# Patient Record
Sex: Female | Born: 1935
Health system: Southern US, Community
[De-identification: ages and names within clinical notes are randomized; demographics above are authoritative.]

## PROBLEM LIST (undated history)

## (undated) ENCOUNTER — Emergency Department (HOSPITAL_COMMUNITY): Discharge status: Against Medical Advice | Payer: Medicare Other

## (undated) DIAGNOSIS — M1990 Unspecified osteoarthritis, unspecified site: Secondary | ICD-10-CM

## (undated) DIAGNOSIS — E785 Hyperlipidemia, unspecified: Secondary | ICD-10-CM

## (undated) DIAGNOSIS — H3530 Unspecified macular degeneration: Secondary | ICD-10-CM

## (undated) DIAGNOSIS — K219 Gastro-esophageal reflux disease without esophagitis: Secondary | ICD-10-CM

## (undated) DIAGNOSIS — Z8489 Family history of other specified conditions: Secondary | ICD-10-CM

## (undated) DIAGNOSIS — N189 Chronic kidney disease, unspecified: Secondary | ICD-10-CM

## (undated) DIAGNOSIS — R5383 Other fatigue: Secondary | ICD-10-CM

## (undated) DIAGNOSIS — R0600 Dyspnea, unspecified: Secondary | ICD-10-CM

## (undated) DIAGNOSIS — R079 Chest pain, unspecified: Secondary | ICD-10-CM

## (undated) DIAGNOSIS — E119 Type 2 diabetes mellitus without complications: Secondary | ICD-10-CM

## (undated) DIAGNOSIS — J45909 Unspecified asthma, uncomplicated: Secondary | ICD-10-CM

## (undated) DIAGNOSIS — H548 Legal blindness, as defined in USA: Secondary | ICD-10-CM

## (undated) DIAGNOSIS — C801 Malignant (primary) neoplasm, unspecified: Secondary | ICD-10-CM

## (undated) DIAGNOSIS — I10 Essential (primary) hypertension: Secondary | ICD-10-CM

## (undated) DIAGNOSIS — M419 Scoliosis, unspecified: Secondary | ICD-10-CM

## (undated) DIAGNOSIS — J449 Chronic obstructive pulmonary disease, unspecified: Secondary | ICD-10-CM

## (undated) DIAGNOSIS — K118 Other diseases of salivary glands: Secondary | ICD-10-CM

## (undated) DIAGNOSIS — B029 Zoster without complications: Secondary | ICD-10-CM

## (undated) HISTORY — DX: Hyperlipidemia, unspecified: E78.5

## (undated) HISTORY — DX: Chronic kidney disease, unspecified: N18.9

## (undated) HISTORY — DX: Other fatigue: R53.83

## (undated) HISTORY — DX: Type 2 diabetes mellitus without complications: E11.9

## (undated) HISTORY — DX: Legal blindness, as defined in USA: H54.8

## (undated) HISTORY — DX: Other diseases of salivary glands: K11.8

## (undated) HISTORY — DX: Scoliosis, unspecified: M41.9

## (undated) HISTORY — DX: Unspecified macular degeneration: H35.30

## (undated) HISTORY — DX: Chest pain, unspecified: R07.9

---
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
## 1990-05-19 HISTORY — PX: ORIF ANKLE FRACTURE: SUR919

## 1994-05-19 HISTORY — PX: OTHER SURGICAL HISTORY: SHX169

## 1997-05-19 HISTORY — PX: CARDIAC CATHETERIZATION: SHX172

## 1998-08-06 ENCOUNTER — Inpatient Hospital Stay (HOSPITAL_COMMUNITY): Admission: AD | Admit: 1998-08-06 | Discharge: 1998-08-14 | Payer: Self-pay | Admitting: Cardiology

## 1998-08-09 ENCOUNTER — Encounter: Payer: Self-pay | Admitting: Cardiology

## 1998-08-10 ENCOUNTER — Encounter: Payer: Self-pay | Admitting: Cardiology

## 1998-08-13 ENCOUNTER — Encounter: Payer: Self-pay | Admitting: Gastroenterology

## 2000-03-23 ENCOUNTER — Other Ambulatory Visit: Admission: RE | Admit: 2000-03-23 | Discharge: 2000-03-23 | Payer: Self-pay | Admitting: *Deleted

## 2000-04-24 ENCOUNTER — Other Ambulatory Visit: Admission: RE | Admit: 2000-04-24 | Discharge: 2000-04-24 | Payer: Self-pay | Admitting: *Deleted

## 2000-04-24 ENCOUNTER — Encounter (INDEPENDENT_AMBULATORY_CARE_PROVIDER_SITE_OTHER): Payer: Self-pay | Admitting: Specialist

## 2001-03-31 ENCOUNTER — Ambulatory Visit (HOSPITAL_COMMUNITY): Admission: RE | Admit: 2001-03-31 | Discharge: 2001-03-31 | Payer: Self-pay | Admitting: Cardiology

## 2001-04-06 ENCOUNTER — Ambulatory Visit (HOSPITAL_COMMUNITY): Admission: RE | Admit: 2001-04-06 | Discharge: 2001-04-06 | Payer: Self-pay | Admitting: Cardiology

## 2001-05-19 DIAGNOSIS — K118 Other diseases of salivary glands: Secondary | ICD-10-CM

## 2001-05-19 HISTORY — DX: Other diseases of salivary glands: K11.8

## 2001-05-19 HISTORY — PX: CHOLECYSTECTOMY: SHX55

## 2001-09-08 ENCOUNTER — Other Ambulatory Visit: Admission: RE | Admit: 2001-09-08 | Discharge: 2001-09-08 | Payer: Self-pay | Admitting: Otolaryngology

## 2001-09-22 ENCOUNTER — Encounter: Payer: Self-pay | Admitting: Otolaryngology

## 2001-09-22 ENCOUNTER — Ambulatory Visit (HOSPITAL_COMMUNITY): Admission: RE | Admit: 2001-09-22 | Discharge: 2001-09-22 | Payer: Self-pay | Admitting: Otolaryngology

## 2004-04-01 ENCOUNTER — Ambulatory Visit (HOSPITAL_COMMUNITY): Admission: RE | Admit: 2004-04-01 | Discharge: 2004-04-01 | Payer: Self-pay | Admitting: Family Medicine

## 2004-09-17 ENCOUNTER — Other Ambulatory Visit: Admission: RE | Admit: 2004-09-17 | Discharge: 2004-09-17 | Payer: Self-pay | Admitting: *Deleted

## 2004-10-09 ENCOUNTER — Encounter: Admission: RE | Admit: 2004-10-09 | Discharge: 2004-10-09 | Payer: Self-pay | Admitting: *Deleted

## 2005-09-04 ENCOUNTER — Ambulatory Visit (HOSPITAL_COMMUNITY): Admission: RE | Admit: 2005-09-04 | Discharge: 2005-09-04 | Payer: Self-pay | Admitting: Family Medicine

## 2006-04-16 ENCOUNTER — Ambulatory Visit: Payer: Self-pay | Admitting: Gastroenterology

## 2006-04-16 ENCOUNTER — Ambulatory Visit (HOSPITAL_COMMUNITY): Admission: RE | Admit: 2006-04-16 | Discharge: 2006-04-16 | Payer: Self-pay | Admitting: Gastroenterology

## 2006-04-16 HISTORY — PX: COLONOSCOPY: SHX174

## 2006-09-03 ENCOUNTER — Other Ambulatory Visit: Admission: RE | Admit: 2006-09-03 | Discharge: 2006-09-03 | Payer: Self-pay | Admitting: Otolaryngology

## 2007-03-11 ENCOUNTER — Ambulatory Visit (HOSPITAL_COMMUNITY): Admission: RE | Admit: 2007-03-11 | Discharge: 2007-03-11 | Payer: Self-pay | Admitting: Family Medicine

## 2008-02-04 ENCOUNTER — Ambulatory Visit (HOSPITAL_COMMUNITY): Admission: RE | Admit: 2008-02-04 | Discharge: 2008-02-04 | Payer: Self-pay | Admitting: Family Medicine

## 2008-02-07 ENCOUNTER — Ambulatory Visit: Payer: Self-pay | Admitting: Cardiology

## 2008-02-10 ENCOUNTER — Ambulatory Visit: Payer: Self-pay | Admitting: Cardiology

## 2008-02-10 ENCOUNTER — Ambulatory Visit (HOSPITAL_COMMUNITY): Admission: RE | Admit: 2008-02-10 | Discharge: 2008-02-10 | Payer: Self-pay | Admitting: Cardiology

## 2008-02-10 ENCOUNTER — Encounter: Payer: Self-pay | Admitting: Cardiology

## 2008-03-07 ENCOUNTER — Ambulatory Visit: Payer: Self-pay | Admitting: Cardiology

## 2008-08-30 ENCOUNTER — Ambulatory Visit (HOSPITAL_COMMUNITY): Admission: RE | Admit: 2008-08-30 | Discharge: 2008-08-30 | Payer: Self-pay | Admitting: Family Medicine

## 2008-08-30 LAB — CONVERTED CEMR LAB
BUN: 24 mg/dL
CO2: 25 meq/L
Chloride: 105 meq/L
Creatinine, Ser: 1.35 mg/dL
Sodium: 141 meq/L

## 2008-09-04 LAB — CONVERTED CEMR LAB
ALT: 10 units/L
AST: 14 units/L
LDL (calc): 100 mg/dL

## 2008-12-28 DIAGNOSIS — R0602 Shortness of breath: Secondary | ICD-10-CM

## 2009-01-01 ENCOUNTER — Ambulatory Visit: Payer: Self-pay | Admitting: Cardiology

## 2009-01-01 DIAGNOSIS — R5381 Other malaise: Secondary | ICD-10-CM

## 2009-01-01 DIAGNOSIS — R5383 Other fatigue: Secondary | ICD-10-CM

## 2009-01-01 LAB — CONVERTED CEMR LAB
Basophils Absolute: 0 10*3/uL (ref 0.0–0.1)
Hemoglobin: 13.4 g/dL (ref 12.0–15.0)
Lymphocytes Relative: 30 % (ref 12–46)
Lymphs Abs: 2 10*3/uL (ref 0.7–4.0)
MCHC: 33.3 g/dL (ref 30.0–36.0)
MCV: 91.4 fL (ref 78.0–100.0)
WBC: 6.7 10*3/uL (ref 4.0–10.5)

## 2009-01-02 ENCOUNTER — Encounter: Payer: Self-pay | Admitting: Cardiology

## 2009-01-08 ENCOUNTER — Encounter (INDEPENDENT_AMBULATORY_CARE_PROVIDER_SITE_OTHER): Payer: Self-pay | Admitting: *Deleted

## 2009-01-08 LAB — CONVERTED CEMR LAB: OCCULT 2: NEGATIVE

## 2009-01-18 ENCOUNTER — Ambulatory Visit: Payer: Self-pay | Admitting: Cardiology

## 2009-01-18 ENCOUNTER — Encounter (HOSPITAL_COMMUNITY): Admission: RE | Admit: 2009-01-18 | Discharge: 2009-02-14 | Payer: Self-pay | Admitting: Cardiology

## 2009-01-25 ENCOUNTER — Encounter (INDEPENDENT_AMBULATORY_CARE_PROVIDER_SITE_OTHER): Payer: Self-pay | Admitting: *Deleted

## 2009-01-30 ENCOUNTER — Ambulatory Visit: Payer: Self-pay | Admitting: Cardiology

## 2010-06-10 ENCOUNTER — Encounter: Payer: Self-pay | Admitting: Family Medicine

## 2010-09-24 ENCOUNTER — Other Ambulatory Visit: Payer: Self-pay | Admitting: Family Medicine

## 2010-09-24 DIAGNOSIS — M8580 Other specified disorders of bone density and structure, unspecified site: Secondary | ICD-10-CM

## 2010-09-27 ENCOUNTER — Encounter (HOSPITAL_COMMUNITY): Payer: Self-pay

## 2010-09-27 ENCOUNTER — Ambulatory Visit (HOSPITAL_COMMUNITY)
Admission: RE | Admit: 2010-09-27 | Discharge: 2010-09-27 | Disposition: A | Discharge status: Home / Self Care | Payer: Medicare Other | Source: Ambulatory Visit | Attending: Family Medicine | Admitting: Family Medicine

## 2010-09-27 DIAGNOSIS — M8580 Other specified disorders of bone density and structure, unspecified site: Secondary | ICD-10-CM

## 2010-09-27 DIAGNOSIS — M899 Disorder of bone, unspecified: Secondary | ICD-10-CM | POA: Insufficient documentation

## 2010-09-27 DIAGNOSIS — M949 Disorder of cartilage, unspecified: Secondary | ICD-10-CM | POA: Insufficient documentation

## 2010-09-27 HISTORY — DX: Essential (primary) hypertension: I10

## 2010-10-01 NOTE — Letter (Signed)
March 07, 2008    Scott A. Gerda Diss, MD  [address], [address]  [place], [place] [address]   RE:  Victoria, Vasquez  MRN:  [id number]  /  DOB:  1928-02-06   Dear Lorin Picket:   [name] returns the office for followup of hypertension,  disequilibrium, and EKG abnormalities.  Since her last visit, she has  improved.  She is walking better and feeling better.  She reports no  chest discomfort and no dyspnea.  She monitors blood pressure at home,  which has been good.   On exam, pleasant woman with a flat affect in no acute distress.  VITAL SIGNS:  The weight is 176, two pounds less than at her previous  visit.  Blood pressure 130/80, heart rate 60 and regular, respirations  14.  NECK:  No jugular venous distention; no carotid bruits.  LUNGS:  Clear.  CARDIAC:  Normal first and second heart sounds.  No murmur nor gallop  appreciated.  ABDOMEN:  Soft and nontender.  EXTREMITIES:  Trace edema.   MRI of the brain showed no significant intracranial abnormalities.  She  has a right parotid mass that was noted on a prior study in 2003 and is  unchanged.  An echocardiogram shows mild LVH, moderate left atrial  enlargement and mild-to-moderate mitral regurgitation.  Left ventricular  systolic function is normal.  A chemistry profile after starting  chlorthalidone reveals normal values except for a glucose of 318 and a  slightly elevated creatinine of 1.4.  Potassium was 4.0.   IMPRESSION:  [name] appears to be doing well overall.  Blood pressure  is better controlled.  She will continue to monitor this at home and  call for significantly elevated values.  Her EKG  abnormalities are likely related to LVH and are chronic.  No further  intervention is warranted.  We were unable to locate any useful records  from [facility].  There was no indication of a prior  catheterization.  I will plan to reassess this nice woman in 6 months or  sooner should she develop problems in the intervening  time.    Sincerely,      Gerrit Friends. Dietrich Pates, MD, [facility]  Electronically Signed    RMR/MedQ  DD: 03/07/2008  DT: 03/08/2008  Job #: [id number]

## 2010-10-01 NOTE — Letter (Signed)
February 07, 2008    Scott A. Gerda Diss, MD  [address]  [place], [place] [address]   RE:  Victoria Vasquez, Victoria Vasquez  MRN:  [id number]  /  DOB:  [birth year]   Dear Lorin Picket:   Ms. Baldassari was evaluated in the office today in consultation at your kind  request.  As you know, this nice woman was recently found to have a  significantly abnormal EKG.  She has been evaluated by cardiology in the  past; unfortunately, she is somewhat vague regarding the dates.  She  believes that she underwent cardiac catheterization in conjunction with  an admission for gallbladder disease approximately 6 years ago.  We have  requested records for all of her care at [facility]  She apparently has  not had significant coronary disease in the past.  She does have  cardiovascular risk factors, most notably hypertension, diabetes and  hyperlipidemia, all of them fairly mild.  She has chronic class II-III  exertional dyspnea.  She has localized chest wall tenderness over the  third rim just lateral to the sternum.   PAST MEDICAL HISTORY:  Otherwise notable for that and ectomy performed  at age 77.   ALLERGIES:  She has no known drug allergies.   CURRENT MEDICATIONS:  1. Lisinopril 20 mg daily.  2. Simvastatin 20 mg daily.  3. Aspirin 325 mg daily.  4. Prilosec 20 mg daily.  5. Glyburide 1.5 mg b.i.d.  6. Symbicort 1 capsule b.i.d.   SOCIAL HISTORY:  Retired from farming; widowed with 3 adult children.   FAMILY HISTORY:  Father died at advanced age due to myeloma; mother died  at age 64, also due to neoplastic disease.  Two sisters who deceased,  one with a brain neoplasm and one with also members.  One brother is  alive and well.   REVIEW OF SYSTEMS:  Notable for intermittent dizziness, the need for  corrective lenses, partial upper and lower dentures, GERD symptoms,  arthritic discomfort in her knees and some ankle edema.  All other  systems reviewed and are negative.   Ms. Better also complains of  unsteadiness of gait.  She does not have the  true vertigo nor is she clearly presyncopal.  She nearly complains of  difficulty maintaining her balance while she is moving about.   PHYSICAL EXAMINATION:  GENERAL:  Pleasant woman in no acute distress.  VITAL SIGNS:  Weight is 178, blood pressure 130/85, heart rate 65 and  regular.  There was a modest decrease in blood pressure of 49 mmHg when  the patient assumed the standing position.  HEENT:  Anicteric sclerae; normal lids and conjunctivae; normal oral  mucosa.  NECK:  No jugular venous distention; normal carotid upstrokes without  bruits.  LUNGS:  Mild kyphosis; clear lung fields.  CARDIAC:  Normal first and second heart sound; minimal systolic ejection  murmur.  ABDOMEN:  Soft and nontender; no masses; no organomegaly.  EXTREMITIES:  Trace edema; normal distal pulses.  NEUROLOGIC:  Symmetric strength and tone; normal cranial nerves.  SKIN:  No significant lesions.  ENDOCRINE:  No thyromegaly.  HEMATOCRIT:  No adenopathy.   EKG:  Normal sinus rhythm; slightly delayed R-wave progression; ST-T  wave abnormalities consistent with inferolateral and anterolateral  ischemia.  When compared to a previous tracing performed on August 07, 1998, T-wave abnormalities are slightly more impressive on the present  tracing, but quite similar.   No recent laboratory studies available.  A chest x-ray shows  cardiomegaly with bibasilar fibrotic changes or atelectasis.  A small  hiatal hernia is noted.   IMPRESSION:  Ms. Sedivy presents with some worrisome symptoms and EKG  abnormalities, but all of these are likely chronic.  Prior records from  [facility] are pending.  Control of  hypertension is suboptimal.  We will add chlorthalidone 12.5 mg daily.  An echocardiogram will be obtained.  Due to her neurologic symptoms, an  MRI study of her head has been requested as well.  I will reassess this  nice woman once prior records are obtained and  initial testing has been  completed.    Sincerely,      Gerrit Friends. Dietrich Pates, MD, [facility]  Electronically Signed    RMR/MedQ  DD: 02/07/2008  DT: 02/08/2008  Job #: [id number]

## 2010-10-04 NOTE — Procedures (Signed)
[facility]  Patient:    KEALA, DRUM Visit Number: [id number] MRN: [id number]          Service Type: OUT Location: RAD Attending Physician:  Kathlen Brunswick Dictated by:   Kari Baars, M.D. Admit Date:  03/31/2001 Discharge Date: 03/31/2001                      Pulmonary Function Test Inter.  1. Spirometry shows mild to moderate ventilatory defect with air flow    obstruction at the level of the small airway. 2. Lung volumes are slightly reduced with TLC 74% of predicted. 3. DLCO mildly reduced. 4. Arterial blood gas is normal. Dictated by:   Kari Baars, M.D. Attending Physician:  Kathlen Brunswick DD:  04/07/01 TD:  04/07/01 Job: [id number]

## 2010-10-04 NOTE — Op Note (Signed)
NAME:  Victoria Vasquez, BREAU                ACCOUNT NO.:  [id number]   MEDICAL RECORD NO.:  [id number]          PATIENT TYPE:  AMB   LOCATION:  DAY                           FACILITY:  [facility]   PHYSICIAN:  Kassie Mends, M.D.      DATE OF BIRTH:  1927/10/25   DATE OF PROCEDURE:  04/16/2006  DATE OF DISCHARGE:                               OPERATIVE REPORT   PROCEDURE:  Colonoscopy.   INDICATION FOR EXAM:  Ms. Kyllo is a 77 year old female who presents for  average risk colon cancer screening.   FINDINGS:  1. Normal colon.  Exam limited due to retained particulate matter in      the lumen of the colon, polyps less than 1 cm would have been      easily missed.  Otherwise, no polyps, masses, inflammatory changes      or vascular ectasia seen.  2. Normal retroflexed view of the rectum.   RECOMMENDATIONS:  1. Screening colonoscopy in five years with a two day bowel prep.  2. Follow up with Dr. Simone Curia.   MEDICATIONS:  1. Demerol 75 mg IV.  2. Versed 3 mg IV.   PROCEDURE TECHNIQUE:  Physical exam was performed.  Informed consent was  obtained from the patient after explaining the benefits, risks and  alternatives to the procedure.  The patient was connected to the monitor  and placed in the left lateral position.  Continuous oxygen was provided  by nasal cannula and IV medicine administered through an indwelling  cannula.  After administration of sedation and rectal exam, the  patient's rectum was intubated  and the scope was advanced under direct visualization to the cecum.  The  scope was subsequently removed slowly by carefully examining the color,  texture, anatomy, and integrity of the mucosa on the way out.  The  patient was recovered in the endoscopy suite and discharged home in  satisfactory condition.      Kassie Mends, M.D.  Electronically Signed     SM/MEDQ  D:  04/16/2006  T:  04/16/2006  Job:  [id number]   cc:   Donna Bernard, M.D.  Fax: [phone]

## 2010-10-16 ENCOUNTER — Other Ambulatory Visit: Payer: Self-pay | Admitting: Dermatology

## 2011-01-10 ENCOUNTER — Ambulatory Visit (HOSPITAL_COMMUNITY)
Admission: RE | Admit: 2011-01-10 | Discharge: 2011-01-10 | Disposition: A | Discharge status: Home / Self Care | Payer: Medicare Other | Source: Ambulatory Visit | Attending: Family Medicine | Admitting: Family Medicine

## 2011-01-10 ENCOUNTER — Other Ambulatory Visit: Payer: Self-pay | Admitting: Family Medicine

## 2011-01-10 DIAGNOSIS — R059 Cough, unspecified: Secondary | ICD-10-CM | POA: Insufficient documentation

## 2011-01-10 DIAGNOSIS — R0602 Shortness of breath: Secondary | ICD-10-CM | POA: Insufficient documentation

## 2011-01-10 DIAGNOSIS — R05 Cough: Secondary | ICD-10-CM

## 2011-02-17 ENCOUNTER — Encounter: Payer: Self-pay | Admitting: Gastroenterology

## 2011-05-06 ENCOUNTER — Encounter: Payer: Self-pay | Admitting: Cardiology

## 2011-10-02 ENCOUNTER — Other Ambulatory Visit: Payer: Self-pay | Admitting: Family Medicine

## 2011-10-02 ENCOUNTER — Ambulatory Visit (HOSPITAL_COMMUNITY)
Admission: RE | Admit: 2011-10-02 | Discharge: 2011-10-02 | Disposition: A | Discharge status: Home / Self Care | Payer: Medicare Other | Source: Ambulatory Visit | Attending: Family Medicine | Admitting: Family Medicine

## 2011-10-02 DIAGNOSIS — M79671 Pain in right foot: Secondary | ICD-10-CM

## 2011-10-02 DIAGNOSIS — M79609 Pain in unspecified limb: Secondary | ICD-10-CM | POA: Insufficient documentation

## 2011-10-06 ENCOUNTER — Other Ambulatory Visit: Payer: Self-pay | Admitting: Family Medicine

## 2011-10-06 DIAGNOSIS — M84374A Stress fracture, right foot, initial encounter for fracture: Secondary | ICD-10-CM

## 2011-10-07 ENCOUNTER — Ambulatory Visit (HOSPITAL_COMMUNITY)
Admission: RE | Admit: 2011-10-07 | Discharge: 2011-10-07 | Disposition: A | Discharge status: Home / Self Care | Payer: Medicare Other | Source: Ambulatory Visit | Attending: Family Medicine | Admitting: Family Medicine

## 2011-10-07 ENCOUNTER — Encounter (HOSPITAL_COMMUNITY): Payer: Self-pay

## 2011-10-07 DIAGNOSIS — M79609 Pain in unspecified limb: Secondary | ICD-10-CM | POA: Insufficient documentation

## 2011-10-07 DIAGNOSIS — M84374A Stress fracture, right foot, initial encounter for fracture: Secondary | ICD-10-CM

## 2011-10-07 DIAGNOSIS — M8430XA Stress fracture, unspecified site, initial encounter for fracture: Secondary | ICD-10-CM | POA: Insufficient documentation

## 2011-10-07 MED ORDER — TECHNETIUM TC 99M MEDRONATE IV KIT
25.0000 | PACK | Freq: Once | INTRAVENOUS | Status: AC | PRN
  Administered 2011-10-07: 25 via INTRAVENOUS

## 2011-10-10 ENCOUNTER — Other Ambulatory Visit (HOSPITAL_COMMUNITY): Payer: Self-pay | Admitting: Orthopedic Surgery

## 2011-10-14 ENCOUNTER — Other Ambulatory Visit (HOSPITAL_COMMUNITY): Payer: Medicare Other

## 2011-10-16 ENCOUNTER — Ambulatory Visit (HOSPITAL_COMMUNITY)
Admission: RE | Admit: 2011-10-16 | Discharge: 2011-10-16 | Disposition: A | Discharge status: Home / Self Care | Payer: Medicare Other | Source: Ambulatory Visit | Attending: Orthopedic Surgery | Admitting: Orthopedic Surgery

## 2011-12-31 ENCOUNTER — Emergency Department (HOSPITAL_COMMUNITY): Payer: Medicare Other

## 2011-12-31 ENCOUNTER — Encounter (HOSPITAL_COMMUNITY): Payer: Self-pay | Admitting: *Deleted

## 2011-12-31 ENCOUNTER — Emergency Department (HOSPITAL_COMMUNITY)
Admission: EM | Admit: 2011-12-31 | Discharge: 2011-12-31 | Disposition: A | Discharge status: Home / Self Care | Payer: Medicare Other | Attending: Emergency Medicine | Admitting: Emergency Medicine

## 2011-12-31 DIAGNOSIS — J449 Chronic obstructive pulmonary disease, unspecified: Secondary | ICD-10-CM | POA: Insufficient documentation

## 2011-12-31 DIAGNOSIS — Z79899 Other long term (current) drug therapy: Secondary | ICD-10-CM | POA: Insufficient documentation

## 2011-12-31 DIAGNOSIS — B029 Zoster without complications: Secondary | ICD-10-CM | POA: Insufficient documentation

## 2011-12-31 DIAGNOSIS — E119 Type 2 diabetes mellitus without complications: Secondary | ICD-10-CM | POA: Insufficient documentation

## 2011-12-31 DIAGNOSIS — J4489 Other specified chronic obstructive pulmonary disease: Secondary | ICD-10-CM | POA: Insufficient documentation

## 2011-12-31 DIAGNOSIS — Z9089 Acquired absence of other organs: Secondary | ICD-10-CM | POA: Insufficient documentation

## 2011-12-31 DIAGNOSIS — Z7982 Long term (current) use of aspirin: Secondary | ICD-10-CM | POA: Insufficient documentation

## 2011-12-31 DIAGNOSIS — I10 Essential (primary) hypertension: Secondary | ICD-10-CM | POA: Insufficient documentation

## 2011-12-31 DIAGNOSIS — E785 Hyperlipidemia, unspecified: Secondary | ICD-10-CM | POA: Insufficient documentation

## 2011-12-31 DIAGNOSIS — G510 Bell's palsy: Secondary | ICD-10-CM | POA: Insufficient documentation

## 2011-12-31 HISTORY — DX: Zoster without complications: B02.9

## 2011-12-31 HISTORY — DX: Chronic obstructive pulmonary disease, unspecified: J44.9

## 2011-12-31 LAB — CBC WITH DIFFERENTIAL/PLATELET
Basophils Absolute: 0 10*3/uL (ref 0.0–0.1)
Basophils Relative: 1 % (ref 0–1)
Eosinophils Relative: 7 % — ABNORMAL HIGH (ref 0–5)
HCT: 38.2 % (ref 36.0–46.0)
MCHC: 33 g/dL (ref 30.0–36.0)
MCV: 91 fL (ref 78.0–100.0)
Monocytes Absolute: 0.7 10*3/uL (ref 0.1–1.0)
Neutro Abs: 4 10*3/uL (ref 1.7–7.7)
Platelets: 237 10*3/uL (ref 150–400)
RDW: 14.9 % (ref 11.5–15.5)
WBC: 7.7 10*3/uL (ref 4.0–10.5)

## 2011-12-31 LAB — URINALYSIS, ROUTINE W REFLEX MICROSCOPIC
Ketones, ur: NEGATIVE mg/dL
Leukocytes, UA: NEGATIVE
Nitrite: NEGATIVE
Protein, ur: NEGATIVE mg/dL
Urobilinogen, UA: 0.2 mg/dL (ref 0.0–1.0)

## 2011-12-31 LAB — BASIC METABOLIC PANEL
Calcium: 9.9 mg/dL (ref 8.4–10.5)
Creatinine, Ser: 1.04 mg/dL (ref 0.50–1.10)
GFR calc Af Amer: 57 mL/min — ABNORMAL LOW (ref >90)
Sodium: 141 mEq/L (ref 135–145)

## 2011-12-31 MED ORDER — LISINOPRIL 10 MG PO TABS
10.0000 mg | ORAL_TABLET | Freq: Once | ORAL | Status: AC
  Administered 2011-12-31: 10 mg via ORAL
  Filled 2011-12-31: qty 1

## 2011-12-31 MED ORDER — VALACYCLOVIR HCL 1 G PO TABS: 1000.0000 mg | ORAL_TABLET | Freq: Three times a day (TID) | ORAL | Status: AC

## 2011-12-31 MED ORDER — CLONIDINE HCL 0.2 MG PO TABS
0.2000 mg | ORAL_TABLET | Freq: Once | ORAL | Status: AC
  Administered 2011-12-31: 0.2 mg via ORAL
  Filled 2011-12-31: qty 1

## 2011-12-31 MED ORDER — PREDNISONE 10 MG PO TABS: 20.0000 mg | ORAL_TABLET | Freq: Two times a day (BID) | ORAL | Status: DC

## 2011-12-31 NOTE — ED Notes (Signed)
Pt drank water with and without straw without coughing, ate cracker without difficulty.  Lung sounds did not change post swallow screen.

## 2011-12-31 NOTE — ED Notes (Signed)
Pt's bp still elevated.  Pt says vomited after taking her bp medication this morning.   EDP notified.  Pt also requesting water to drink.  Notified Dr. Judd Lien and ok for pt to drink water.  EDP says pt does not need swallow screen.  Pt drinking water without coughing or drooling.  Says is difficulty because left side of face is numb.

## 2011-12-31 NOTE — ED Notes (Signed)
Numbness lt side of face with droop. Onset last pm. Has shinges rash on lt side of neck. Vomited x1 this am "I choked on my pills"

## 2011-12-31 NOTE — ED Notes (Signed)
Pt reports was diagnosed with shingles approx a month ago.  Shingles are on left shoulder, neck, face and head.  Reports last night left side of face and jaw felt numb.  Pt says when she brushed her teeth, her spit ran out of r side of mouth.  Pt has r sided facial droop.  Also reports having difficulty swallowing, was unable to swallow her pills this morning.  Reports got choked and spit them up.  Denies headache but says still having shingles pain.  Pt c/o feeling weak all over, not any worse on one side or the other.  Pt alert and oriented.  BP elevated.  Notified Dr. Judd Lien and Dr. Estell Harpin.

## 2011-12-31 NOTE — ED Notes (Signed)
Discharge instructions reviewed with pt, questions answered. Pt verbalized understanding.  

## 2011-12-31 NOTE — Discharge Instructions (Signed)
Bell's Palsy Bell's palsy is a condition in which the muscles on one side of the face cannot move (paralysis). This is because the nerves in the face are paralyzed. It is most often thought to be caused by a virus. The virus causes swelling of the nerve that controls movement on one side of the face. The nerve travels through a tight space surrounded by bone. When the nerve swells, it can be compressed by the bone. This results in damage to the protective covering around the nerve. This damage interferes with how the nerve communicates with the muscles of the face. As a result, it can cause weakness or paralysis of the facial muscles.  Injury (trauma), tumor, and surgery may cause Bell's palsy, but most of the time the cause is unknown. It is a relatively common condition. It starts suddenly (abrupt onset) with the paralysis usually ending within 2 days. Bell's palsy is not dangerous. But because the eye does not close properly, you may need care to keep the eye from getting dry. This can include splinting (to keep the eye shut) or moistening with artificial tears. Bell's palsy very seldom occurs on both sides of the face at the same time. SYMPTOMS   Eyebrow sagging.   Drooping of the eyelid and corner of the mouth.   Inability to close one eye.   Loss of taste on the front of the tongue.   Sensitivity to loud noises.  TREATMENT  The treatment is usually non-surgical. If the patient is seen within the first 24 to 48 hours, a short course of steroids may be prescribed, in an attempt to shorten the length of the condition. Antiviral medicines may also be used with the steroids, but it is unclear if they are helpful.  You will need to protect your eye, if you cannot close it. The cornea (clear covering over your eye) will become dry and can be damaged. Artificial tears can be used to keep your eye moist. Glasses or an eye patch should be worn to protect your eye. PROGNOSIS  Recovery is variable,  ranging from days to months. Although the problem usually goes away completely (about 80% of cases resolve), predicting the outcome is impossible. Most people improve within 3 weeks of when the symptoms began. Improvement may continue for 3 to 6 months. A small number of people have moderate to severe weakness that is permanent.  HOME CARE INSTRUCTIONS   If your caregiver prescribed medication to reduce swelling in the nerve, use as directed. Do not stop taking the medication unless directed by your caregiver.   Use moisturizing eye drops as needed to prevent drying of your eye, as directed by your caregiver.   Protect your eye, as directed by your caregiver.   Use facial massage and exercises, as directed by your caregiver.   Perform your normal activities, and get your normal rest.  SEEK IMMEDIATE MEDICAL CARE IF:   There is pain, redness or irritation in the eye.   You or your child has an oral temperature above 102 F (38.9 C), not controlled by medicine.  MAKE SURE YOU:   Understand these instructions.   Will watch your condition.   Will get help right away if you are not doing well or get worse.  Document Released: 05/05/2005 Document Revised: 04/24/2011 Document Reviewed: 05/14/2009 [facility] Patient Information 2012 [place], [place].Shingles Shingles is caused by the same virus that causes chickenpox (varicella zoster virus or VZV). Shingles often occurs many years or decades after  having chickenpox. That is why it is more common in adults older than 50 years. The virus reactivates and breaks out as an infection in a nerve root. SYMPTOMS   The initial feeling (sensations) may be pain. This pain is usually described as:   Burning.   Stabbing.   Throbbing.   Tingling in the nerve root.   A red rash will follow in a couple days. The rash may occur in any area of the body and is usually on one side (unilateral) of the body in a band or belt-like pattern. The rash usually  starts out as very small blisters (vesicles). They will dry up after 7 to 10 days. This is not usually a significant problem except for the pain it causes.   Long-lasting (chronic) pain is more likely in an elderly person. It can last months to years. This condition is called postherpetic neuralgia.  Shingles can be an extremely severe infection in someone with AIDS, a weakened immune system, or with forms of leukemia. It can also be severe if you are taking transplant medicines or other medicines that weaken the immune system. TREATMENT  Your caregiver will often treat you with:  Antiviral drugs.   Anti-inflammatory drugs.   Pain medicines.  Bed rest is very important in preventing the pain associated with herpes zoster (postherpetic neuralgia). Application of heat in the form of a hot water bottle or electric heating pad or gentle pressure with the hand is recommended to help with the pain or discomfort. PREVENTION  A varicella zoster vaccine is available to help protect against the virus. The Food and Drug Administration approved the varicella zoster vaccine for individuals 59 years of age and older. HOME CARE INSTRUCTIONS   Cool compresses to the area of rash may be helpful.   Only take over-the-counter or prescription medicines for pain, discomfort, or fever as directed by your caregiver.   Avoid contact with:   Babies.   Pregnant women.   Children with eczema.   Elderly people with transplants.   People with chronic illnesses, such as leukemia and AIDS.   If the area involved is on your face, you may receive a referral for follow-up to a specialist. It is very important to keep all follow-up appointments. This will help avoid eye complications, chronic pain, or disability.  SEEK IMMEDIATE MEDICAL CARE IF:   You develop any pain (headache) in the area of the face or eye. This must be followed carefully by your caregiver or ophthalmologist. An infection in part of your eye  (cornea) can be very serious. It could lead to blindness.   You do not have pain relief from prescribed medicines.   Your redness or swelling spreads.   The area involved becomes very swollen and painful.   You have a fever.   You notice any red or painful lines extending away from the affected area toward your heart (lymphangitis).   Your condition is worsening or has changed.  Document Released: 05/05/2005 Document Revised: 04/24/2011 Document Reviewed: 04/09/2009 [facility] Patient Information 2012 [place], [place].

## 2011-12-31 NOTE — ED Provider Notes (Signed)
History  This chart was scribed for Victoria Lyons, MD by Victoria Vasquez. This patient was seen in room APA18/APA18 and the patient's care was started at 1335.   CSN: [id number]  Arrival date & time 12/31/11  1335   First MD Initiated Contact with Patient 12/31/11 1449      Chief Complaint  Patient presents with  . Numbness   The history is provided by the patient. No language interpreter was used.   Victoria Vasquez is a 78 y.o. female who presents to the Emergency Department complaining of left sided facial weakness which began three weeks ago when she also started with a shingles rash on the left side of her neck. She states trouble eating/drinking food because of her facial weakness. She states she is still being treated for her shingles rash which has mildly improved. She denies weakness anywhere else over her body and denies any other injuries/illnesses.   Past Medical History  Diagnosis Date  . Diabetes mellitus   . Hypertension   . Exertional dyspnea   . Hyperlipidemia   . Fatigue   . Shingles   . COPD (chronic obstructive pulmonary disease)     Past Surgical History  Procedure Date  . Cholecystectomy 2003  . Appendectomy     Age 26  . Colonoscopy   . Orif 1996  . Cardiac catheterization 1999    Family History  Problem Relation Age of Onset  . Stomach cancer Mother   . Alzheimer's disease Sister     History  Substance Use Topics  . Smoking status: Passive Smoker  . Smokeless tobacco: Not on file  . Alcohol Use: No    OB History    Grav Para Term Preterm Abortions TAB SAB Ect Mult Living                  Review of Systems  Constitutional: Negative for fever and chills.  Respiratory: Negative for shortness of breath.   Gastrointestinal: Negative for nausea and vomiting.  Skin: Positive for rash (Red spotted rash over the left side of her neck face and jaw.).  Neurological: Negative for weakness.       Left sided facial weakness.   All other systems  reviewed and are negative.    Allergies  Review of patient's allergies indicates no known allergies.  Home Medications   Current Outpatient Rx  Name Route Sig Dispense Refill  . ASPIRIN 325 MG PO TBEC Oral Take 325 mg by mouth daily.    . BUDESONIDE-FORMOTEROL FUMARATE 80-4.5 MCG/ACT IN AERO Inhalation Inhale 2 puffs into the lungs as needed. Shortness of breath    . OMEGA-3 FATTY ACIDS 1000 MG PO CAPS Oral Take 2 capsules by mouth daily.      Marland Kitchen GLIPIZIDE 5 MG PO TABS Oral Take 2.5 mg by mouth 2 (two) times daily.    Marland Kitchen LISINOPRIL 5 MG PO TABS Oral Take 5 mg by mouth daily.    Marland Kitchen METFORMIN HCL 500 MG PO TABS Oral Take 250 mg by mouth 2 (two) times daily.    . ICAPS PO CAPS Oral Take 2 capsules by mouth daily.      Marland Kitchen OMEPRAZOLE 20 MG PO CPDR Oral Take 20 mg by mouth daily.      Marland Kitchen VITAMIN D (ERGOCALCIFEROL) 50000 UNITS PO CAPS Oral Take 50,000 Units by mouth every 7 (seven) days. On Wednesday      Triage Vitals: BP 210/88  Pulse 75  Temp 98.1 F (36.7 C) (  Oral)  Resp 18  Ht 5\' 6"  (1.676 m)  Wt 172 lb (78.019 kg)  BMI 27.76 kg/m2  SpO2 100%  Physical Exam  Nursing note and vitals reviewed. Constitutional: She is oriented to person, place, and time. She appears well-developed and well-nourished. No distress.  HENT:  Head: Normocephalic and atraumatic.  Eyes: EOM are normal.  Neck: Neck supple. No tracheal deviation present.  Cardiovascular: Normal rate.   Pulmonary/Chest: Effort normal. No respiratory distress.  Musculoskeletal: Normal range of motion.  Neurological: She is alert and oriented to person, place, and time.       Left sided facial droop present that includes the forehead. The left eyelid does not close all of the way. Strength is 5/5 both BUE/BLE and is symmetrical.   Skin: Skin is warm and dry.       Vesicular rash on the left side of the neck in a dermatomal pattern.   Psychiatric: She has a normal mood and affect. Her behavior is normal.    ED Course    Procedures (including critical care time) DIAGNOSTIC STUDIES: Oxygen Saturation is 99% on room air, normal by my interpretation.    COORDINATION OF CARE: At 250 PM Discussed treatment plan with patient which includes blood work, UA, head CT, and EKG. Patient agrees.    Labs Reviewed  CBC WITH DIFFERENTIAL  BASIC METABOLIC PANEL  URINALYSIS, ROUTINE W REFLEX MICROSCOPIC   No results found.   No diagnosis found.   Date: 12/31/2011  Rate: 64  Rhythm: normal sinus rhythm  QRS Axis: normal  Intervals: normal  ST/T Wave abnormalities: nonspecific ST changes  Conduction Disutrbances:none  Narrative Interpretation:   Old EKG Reviewed: unchanged    MDM  The patient presents with facial numbness and droop.  She was recently diagnosed with shingles on the neck.  The history, physical exam, and results of the workup are consistent with Bell's Palsy.  The head ct and labs are reassuring, and her blood pressure which was high when she arrived, is now improving.  She will be discharged with a prescription for prednisone, valtrex, and is advised to follow up with her pcp in the next week.     I personally performed the services described in this documentation, which was scribed in my presence. The recorded information has been reviewed and considered.           Victoria Lyons, MD 12/31/11 [phone]

## 2012-01-22 LAB — CBC WITH DIFFERENTIAL/PLATELET
ALT: 13 U/L (ref 7–35)
AST: 14 U/L
Alkaline Phosphatase: 48 U/L
Bilirubin, Direct: 0.1 mg/dL (ref 0.01–0.4)
Bilirubin, Indirect: 0.3

## 2012-02-10 ENCOUNTER — Encounter: Payer: Self-pay | Admitting: Gastroenterology

## 2012-02-11 ENCOUNTER — Encounter: Payer: Self-pay | Admitting: Urgent Care

## 2012-02-11 ENCOUNTER — Ambulatory Visit (INDEPENDENT_AMBULATORY_CARE_PROVIDER_SITE_OTHER): Payer: Medicare Other | Admitting: Urgent Care

## 2012-02-11 VITALS — BP 130/90 | HR 88 | Temp 97.6°F | Ht 64.0 in | Wt 177.6 lb

## 2012-02-11 DIAGNOSIS — K219 Gastro-esophageal reflux disease without esophagitis: Secondary | ICD-10-CM

## 2012-02-11 DIAGNOSIS — D649 Anemia, unspecified: Secondary | ICD-10-CM

## 2012-02-11 NOTE — Patient Instructions (Addendum)
Do not take ALEVE or other antiinflammatories or aspirin like Advil, Ibuprofen, Goodys, etc You may take aspirin daily Return stool cards to [place] ASAP You will need EGD & colonoscopy with Dr Darrick Penna in near future

## 2012-02-11 NOTE — Progress Notes (Signed)
Faxed to PCP

## 2012-02-11 NOTE — Progress Notes (Signed)
Referring Provider: Babs Sciara, MD Primary Care Physician:  Lilyan Punt, MD Primary Gastroenterologist:  Dr. Jonette Eva  Chief Complaint  Patient presents with  . Anemia    HPI:  Victoria Vasquez is a 78 y.o. female here as a referral from Dr. Gerda Diss for normocytic anemia.  She was last seen by Dr. Darrick Penna back in 2007 for screening colonoscopy.  Exam is limited due to retained particulate matter and repeat exam was advised in 5 years, however she never had this done. She was found to have a hemoglobin of 11.8 on recent routine lab work. Ferritin was 16. MCV 92. Iron 101. TIBC 218, TIBC 319, percent saturation 32. She is not seen rectal bleeding or melena. She does have occasional constipation, but is not severe so she is not taking anything for it. She has a history of anemia over 30 years ago when she had a blood transfusion after a miscarriage. Denies any chronic anemia. She has recently been onAleve and prednisone for shingles along with her daily aspirin. She has been having heartburn and indigestion as well as globus. She denies any dysphagia or odynophagia. She is taking Prilosec 20 mg daily. Her weight has been stable and her appetite has been good.  Past Medical History  Diagnosis Date  . Diabetes mellitus   . Hypertension   . Exertional dyspnea   . Hyperlipidemia   . Fatigue   . Shingles   . COPD (chronic obstructive pulmonary disease)     Past Surgical History  Procedure Date  . Cholecystectomy 2003  . Appendectomy     Age 12  . Colonoscopy 04/16/2006    [place]-Normal colon.  Exam limited due to retained particulate matter in the lumen of the colon, polyps less than 1 cm would have been easily missed.  Otherwise, no polyps, masses, inflammatory changes or vascular ectasia seen/ Normal retroflexed view of the rectum..  recommend repeat exam at 03/2011  . Orif 1996  . Cardiac catheterization 1999    Current Outpatient Prescriptions  Medication Sig Dispense Refill  .  aspirin 325 MG EC tablet Take 325 mg by mouth daily.      . budesonide-formoterol (SYMBICORT) 80-4.5 MCG/ACT inhaler Inhale 2 puffs into the lungs as needed. Shortness of breath      . fish oil-omega-3 fatty acids 1000 MG capsule Take 2 capsules by mouth daily.        Marland Kitchen glipiZIDE (GLUCOTROL) 5 MG tablet Take 2.5 mg by mouth 2 (two) times daily.      Marland Kitchen lisinopril (PRINIVIL,ZESTRIL) 5 MG tablet Take 5 mg by mouth daily.      . metFORMIN (GLUCOPHAGE) 500 MG tablet Take 250 mg by mouth 2 (two) times daily.      . Multiple Vitamins-Minerals (ICAPS) CAPS Take 2 capsules by mouth daily.        Marland Kitchen omeprazole (PRILOSEC) 20 MG capsule Take 20 mg by mouth daily.        . Vitamin D, Ergocalciferol, (DRISDOL) 50000 UNITS CAPS Take 50,000 Units by mouth every 7 (seven) days. On Wednesday        Allergies as of 02/11/2012  . (No Known Allergies)    Family History  Problem Relation Age of Onset  . Stomach cancer Mother   . Alzheimer's disease Sister     History   Social History  . Marital Status: Widowed    Spouse Name: N/A    Number of Children: 3  . Years of Education: N/A  Occupational History  . Retired    Social History Main Topics  . Smoking status: Passive Smoke Exposure - Never Smoker  . Smokeless tobacco: Not on file  . Alcohol Use: No  . Drug Use: No  . Sexually Active: Not on file   Other Topics Concern  . Not on file   Social History Narrative   Widowed3 childrenNo regular exercise    Review of Systems: Gen: Denies any fever, chills, sweats, anorexia, fatigue, weakness, malaise, weight loss, and sleep disorder CV: Denies chest pain, angina, palpitations, syncope, orthopnea, PND, peripheral edema, and claudication. Resp: Denies dyspnea at rest, dyspnea with exercise, cough, sputum, wheezing, coughing up blood, and pleurisy. GI: Denies vomiting blood, jaundice, and fecal incontinence. . GU : Denies urinary burning, blood in urine, urinary frequency, urinary hesitancy,  nocturnal urination, and urinary incontinence. MS: Denies joint pain, limitation of movement, and swelling, stiffness, low back pain, extremity pain. Denies muscle weakness, cramps, atrophy.  Derm: Denies rash, itching, dry skin, hives, moles, warts, or unhealing ulcers.  Psych: Denies depression, anxiety, memory loss, suicidal ideation, hallucinations, paranoia, and confusion. Heme: Denies bruising, bleeding, and enlarged lymph nodes. Neuro:  Denies any headaches, dizziness, paresthesias. Endo:  Denies any problems with DM, thyroid, adrenal function.  Physical Exam: BP 130/90  Pulse 88  Temp 97.6 F (36.4 C) (Temporal)  Ht 5\' 4"  (1.626 m)  Wt 177 lb 9.6 oz (80.559 kg)  BMI 30.49 kg/m2 No LMP recorded. Patient is postmenopausal. General:   Alert,  Well-developed, well-nourished, pleasant and cooperative in NAD Head:  Normocephalic and atraumatic. Eyes:  Sclera clear, no icterus.   Conjunctiva pink. Ears:  Normal auditory acuity. Nose:  No deformity, discharge, or lesions. Mouth:  No deformity or lesions,oropharynx pink & moist. Neck:  Supple; no masses or thyromegaly. Lungs:  Clear throughout to auscultation.   No wheezes, crackles, or rhonchi. No acute distress. Heart:  Regular rate and rhythm; no murmurs, clicks, rubs,  or gallops. Abdomen:  Normal bowel sounds.  No bruits.  Soft, non-tender and non-distended without masses, hepatosplenomegaly or hernias noted.  No guarding or rebound tenderness.   Rectal:  No external or internal lesions visualized or palpated. A small amount of medium brown stool was obtained from the vault which was Hemoccult negative. Msk:  Symmetrical without gross deformities. Normal posture. Pulses:  Normal pulses noted. Extremities:  No clubbing or edema. Neurologic:  Alert and oriented x4;  grossly normal neurologically. Skin:  Intact without significant lesions or rashes. Lymph Nodes:  No significant cervical adenopathy. Psych:  Alert and cooperative.  Normal mood and affect.

## 2012-02-11 NOTE — Assessment & Plan Note (Addendum)
Victoria Vasquez is a pleasant 78 y.o. female with mild normocytic anemia. Hemoccult negative today. No gross GI bleeding. Her iron and ferritin are low normal.  In light of the fact that she is overdue for colonoscopy given incomplete exam over 5 years ago, she is going to need a colonoscopy. She'll also need an EGD given her upper GI symptoms. I have discussed risks & benefits which include, but are not limited to, bleeding, infection, perforation & drug reaction.  The patient agrees with this plan & written consent will be obtained.  She is at risk for GI injury given recent use of prednisone, Aleve, and aspirin.  Hemoccult stools x3 to look for occult GI bleeding Do not take ALEVE or other antiinflammatories or aspirin like Advil, Ibuprofen, Goodys, etc Continue aspirin daily Take half of Glucophage and glipizide the day prior to the procedures Hold diabetes medications day of procedure Bring all medications and/or any insulin to the hospital the day of the procedure. Follow blood sugars, call us or your PCP if any problems.

## 2012-02-11 NOTE — Progress Notes (Signed)
Quick Note:  Reviewed at OV ______ 

## 2012-02-11 NOTE — Assessment & Plan Note (Signed)
Refractory GERD and globus despite Prilosec 20 mg daily. Symptoms may be exacerbated by recent prednisone and NSAID along with aspirin. Differentials include NSAID induced gastritis, H. pylori and peptic ulcer disease.  EGD with Dr. Darrick Penna.  Continue Prilosec 20 milligrams daily

## 2012-02-16 ENCOUNTER — Telehealth: Payer: Self-pay | Admitting: Urgent Care

## 2012-02-16 ENCOUNTER — Ambulatory Visit: Payer: Medicare Other | Admitting: Urgent Care

## 2012-02-16 DIAGNOSIS — D649 Anemia, unspecified: Secondary | ICD-10-CM

## 2012-02-16 LAB — POC HEMOCCULT BLD/STL (HOME/3-CARD/SCREEN)
Card #3 Fecal Occult Blood, POC: NEGATIVE
Fecal Occult Blood, POC: NEGATIVE

## 2012-02-16 NOTE — Progress Notes (Signed)
Faxed to PCP

## 2012-02-16 NOTE — Progress Notes (Signed)
Quick Note:  See phone note Cc:LUKING,SCOTT, MD  ______ 

## 2012-02-16 NOTE — Telephone Encounter (Signed)
Hemoccults negative x 3.  Discussed w/ pt.  I have discussed risks & benefits which include, but are not limited to, bleeding, infection, perforation & drug reaction.  The patient agrees with this plan & written consent will be obtained.   Pt needs EGD & colonoscopy w/ Dr Darrick Penna with 2 DAY PREP Re: incomplete colonoscopy 2007 & refractory GERD, anemia Take half of glipizide & glucophage the day prior to the procedure Hold diabetes medications day of procedure Bring all your medications and/or any insulin to the hospital the day of the procedure. Follow blood sugars, call us or your PCP if any problems. *Prefers any day but Wednesday Please arrange Thanks

## 2012-02-17 ENCOUNTER — Other Ambulatory Visit: Payer: Self-pay | Admitting: Gastroenterology

## 2012-02-17 DIAGNOSIS — D649 Anemia, unspecified: Secondary | ICD-10-CM

## 2012-02-17 DIAGNOSIS — K219 Gastro-esophageal reflux disease without esophagitis: Secondary | ICD-10-CM

## 2012-02-17 MED ORDER — PEG-KCL-NACL-NASULF-NA ASC-C 100 G PO SOLR: 1.0000 | ORAL | Status: DC

## 2012-02-17 NOTE — Telephone Encounter (Signed)
Patient is scheduled with [facility] on Friday Oct 11th at 9:30 and Ive mailed her the instructions and she is aware

## 2012-02-23 ENCOUNTER — Encounter (HOSPITAL_COMMUNITY): Payer: Self-pay

## 2012-02-26 MED ORDER — SODIUM CHLORIDE 0.45 % IV SOLN
INTRAVENOUS | Status: DC
  Administered 2012-02-27: 09:00:00 via INTRAVENOUS

## 2012-02-27 ENCOUNTER — Encounter (HOSPITAL_COMMUNITY): Payer: Self-pay | Admitting: *Deleted

## 2012-02-27 ENCOUNTER — Ambulatory Visit (HOSPITAL_COMMUNITY)
Admission: RE | Admit: 2012-02-27 | Discharge: 2012-02-27 | Disposition: A | Discharge status: Home / Self Care | Payer: Medicare Other | Source: Ambulatory Visit | Attending: Gastroenterology | Admitting: Gastroenterology

## 2012-02-27 ENCOUNTER — Encounter (HOSPITAL_COMMUNITY)
Admission: RE | Disposition: A | Discharge status: Home / Self Care | Payer: Self-pay | Source: Ambulatory Visit | Attending: Gastroenterology

## 2012-02-27 DIAGNOSIS — I10 Essential (primary) hypertension: Secondary | ICD-10-CM | POA: Insufficient documentation

## 2012-02-27 DIAGNOSIS — K219 Gastro-esophageal reflux disease without esophagitis: Secondary | ICD-10-CM

## 2012-02-27 DIAGNOSIS — K2940 Chronic atrophic gastritis without bleeding: Secondary | ICD-10-CM | POA: Insufficient documentation

## 2012-02-27 DIAGNOSIS — J449 Chronic obstructive pulmonary disease, unspecified: Secondary | ICD-10-CM | POA: Insufficient documentation

## 2012-02-27 DIAGNOSIS — K5730 Diverticulosis of large intestine without perforation or abscess without bleeding: Secondary | ICD-10-CM | POA: Insufficient documentation

## 2012-02-27 DIAGNOSIS — K2970 Gastritis, unspecified, without bleeding: Secondary | ICD-10-CM

## 2012-02-27 DIAGNOSIS — Z01812 Encounter for preprocedural laboratory examination: Secondary | ICD-10-CM | POA: Insufficient documentation

## 2012-02-27 DIAGNOSIS — E119 Type 2 diabetes mellitus without complications: Secondary | ICD-10-CM | POA: Insufficient documentation

## 2012-02-27 DIAGNOSIS — D649 Anemia, unspecified: Secondary | ICD-10-CM

## 2012-02-27 DIAGNOSIS — K648 Other hemorrhoids: Secondary | ICD-10-CM | POA: Insufficient documentation

## 2012-02-27 DIAGNOSIS — K222 Esophageal obstruction: Secondary | ICD-10-CM | POA: Insufficient documentation

## 2012-02-27 DIAGNOSIS — J4489 Other specified chronic obstructive pulmonary disease: Secondary | ICD-10-CM | POA: Insufficient documentation

## 2012-02-27 DIAGNOSIS — K2990 Gastroduodenitis, unspecified, without bleeding: Secondary | ICD-10-CM

## 2012-02-27 LAB — GLUCOSE, CAPILLARY: Glucose-Capillary: 171 mg/dL — ABNORMAL HIGH (ref 70–99)

## 2012-02-27 SURGERY — COLONOSCOPY WITH ESOPHAGOGASTRODUODENOSCOPY (EGD)
Anesthesia: Moderate Sedation

## 2012-02-27 MED ORDER — MIDAZOLAM HCL 5 MG/5ML IJ SOLN
INTRAMUSCULAR | Status: AC
  Filled 2012-02-27: qty 10

## 2012-02-27 MED ORDER — HYDRALAZINE HCL 20 MG/ML IJ SOLN
INTRAMUSCULAR | Status: AC
  Filled 2012-02-27: qty 1

## 2012-02-27 MED ORDER — BUTAMBEN-TETRACAINE-BENZOCAINE 2-2-14 % EX AERO
INHALATION_SPRAY | CUTANEOUS | Status: DC | PRN
  Administered 2012-02-27: 2 via TOPICAL

## 2012-02-27 MED ORDER — HYDRALAZINE HCL 20 MG/ML IJ SOLN
10.0000 mg | Freq: Once | INTRAMUSCULAR | Status: AC
  Administered 2012-02-27: 10 mg via INTRAVENOUS

## 2012-02-27 MED ORDER — STERILE WATER FOR IRRIGATION IR SOLN
Status: DC | PRN
  Administered 2012-02-27: 10:00:00

## 2012-02-27 MED ORDER — MEPERIDINE HCL 100 MG/ML IJ SOLN
INTRAMUSCULAR | Status: AC
  Filled 2012-02-27: qty 2

## 2012-02-27 MED ORDER — MINERAL OIL PO OIL
TOPICAL_OIL | ORAL | Status: AC
  Filled 2012-02-27: qty 30

## 2012-02-27 MED ORDER — MIDAZOLAM HCL 5 MG/5ML IJ SOLN
INTRAMUSCULAR | Status: DC | PRN
  Administered 2012-02-27 (×3): 1 mg via INTRAVENOUS
  Administered 2012-02-27: 2 mg via INTRAVENOUS
  Administered 2012-02-27: 1 mg via INTRAVENOUS

## 2012-02-27 MED ORDER — MEPERIDINE HCL 100 MG/ML IJ SOLN
INTRAMUSCULAR | Status: DC | PRN
  Administered 2012-02-27 (×3): 25 mg via INTRAVENOUS

## 2012-02-27 NOTE — H&P (Signed)
Primary Care Physician:  Lilyan Punt, MD Primary Gastroenterologist:  Dr. Darrick Penna  Pre-Procedure History & Physical: HPI:  Victoria Vasquez is a 78 y.o. female here for  ANEMIA/HEME NEGx3.   Past Medical History  Diagnosis Date  . Diabetes mellitus   . Hypertension   . Exertional dyspnea   . Hyperlipidemia   . Fatigue   . Shingles   . COPD (chronic obstructive pulmonary disease)     Past Surgical History  Procedure Date  . Cholecystectomy 2003  . Appendectomy     Age 89  . Colonoscopy 04/16/2006    Manus-Normal colon.  Exam limited due to retained particulate matter in the lumen of the colon, polyps less than 1 cm would have been easily missed.  Otherwise, no polyps, masses, inflammatory changes or vascular ectasia seen/ Normal retroflexed view of the rectum..  recommend repeat exam at 03/2011  . Orif 1996  . Cardiac catheterization 1999    Prior to Admission medications   Medication Sig Start Date End Date Taking? Authorizing Provider  peg 3350 powder (MOVIPREP) 100 G SOLR Take 1 kit (100 g total) by mouth as directed. 02/17/12  Yes [place] Bali, MD  aspirin 325 MG EC tablet Take 325 mg by mouth daily.    Historical Provider, MD  atorvastatin (LIPITOR) 10 MG tablet Take 1 tablet by mouth 3 (three) times a week. Take on Monday, Wednesday, and Friday 01/22/12   Historical Provider, MD  budesonide-formoterol (SYMBICORT) 80-4.5 MCG/ACT inhaler Inhale 2 puffs into the lungs daily as needed. Shortness of breath    Historical Provider, MD  gabapentin (NEURONTIN) 100 MG capsule Take 100 mg by mouth daily.    Historical Provider, MD  glipiZIDE (GLUCOTROL) 5 MG tablet Take 2.5 mg by mouth 2 (two) times daily.    Historical Provider, MD  lisinopril (PRINIVIL,ZESTRIL) 5 MG tablet Take 5 mg by mouth daily.    Historical Provider, MD  metFORMIN (GLUCOPHAGE) 500 MG tablet Take 250 mg by mouth 2 (two) times daily.    Historical Provider, MD  omeprazole (PRILOSEC) 20 MG capsule Take 20 mg by  mouth daily.     Historical Provider, MD  Vitamin D, Ergocalciferol, (DRISDOL) 50000 UNITS CAPS Take 50,000 Units by mouth every 7 (seven) days. On Wednesday    Historical Provider, MD    Allergies as of 02/17/2012  . (No Known Allergies)    Family History  Problem Relation Age of Onset  . Stomach cancer Mother   . Alzheimer's disease Sister   . Colon cancer Neg Hx     History   Social History  . Marital Status: Widowed    Spouse Name: N/A    Number of Children: 3  . Years of Education: N/A   Occupational History  . Retired    Social History Main Topics  . Smoking status: Passive Smoke Exposure - Never Smoker  . Smokeless tobacco: Not on file  . Alcohol Use: No  . Drug Use: No  . Sexually Active: Not on file   Other Topics Concern  . Not on file   Social History Narrative   Widowed3 childrenNo regular exercise    Review of Systems: See HPI, otherwise negative ROS   Physical Exam: BP 183/89  Pulse 75  Temp 98 F (36.7 C) (Oral)  Resp 18  Ht 5\' 4"  (1.626 m)  Wt 177 lb (80.287 kg)  BMI 30.38 kg/m2  SpO2 98% General:   Alert,  pleasant and cooperative in NAD Head:  Normocephalic and atraumatic. Neck:  Supple; Lungs:  Clear throughout to auscultation.    Heart:  Regular rate and rhythm. Abdomen:  Soft, nontender and nondistended. Normal bowel sounds, without guarding, and without rebound.   Neurologic:  Alert and  oriented x4;  grossly normal neurologically.  Impression/Plan:     Anemia/heme negX3  PLAN:  1. TCS/?EGD TODAY

## 2012-02-27 NOTE — Op Note (Signed)
[facility] [address] [place], [address]   ENDOSCOPY PROCEDURE REPORT  PATIENT: Victoria Vasquez, Victoria Vasquez  MR#: [id number] BIRTHDATE: July 03, 1927 , 84  yrs. old GENDER: Female  ENDOSCOPIST: Jonette Eva, MD REFFERED XB:JYNWG Gerda Diss, M.D.  PROCEDURE DATE:  02/27/2012 PROCEDURE:   EGD with biopsy and EGD with dilatation over guidewire   INDICATIONS:1.  anemia. MEDICATIONS: Demerol 25 mg IV and Versed 1mg  IV TOPICAL ANESTHETIC: Cetacaine Spray  DESCRIPTION OF PROCEDURE:   After the risks benefits and alternatives of the procedure were thoroughly explained, informed consent was obtained.  The EG-[id number] ([id number])  endoscope was introduced through the mouth and advanced to the second portion of the duodenum.  The instrument was slowly withdrawn as the mucosa was carefully examined.  Prior to withdrawal of the scope, the guidwire was placed.  The esophagus was dilated successfully.  The patient was recovered in endoscopy and discharged home in satisfactory condition.      ESOPHAGUS: A stricture was found at the gastroesophageal junction. The stenosis was traversable with the endoscope.   A medium sized hiatal hernia was noted.  STOMACH: Non-erosive gastritis (inflammation) was found.  Multiple biopsies were performed using cold forceps.  DUODENUM: The duodenal mucosa showed no abnormalities.   Dilation was then performed at the gastroesphageal junction  Dilator: Savary over guidewire Size(s): 12.8-16 mm Resistance: minimal TO MODERATE.  COMPLICATIONS: There were no complications.   ENDOSCOPIC IMPRESSION: 1.   Stricture was found at the gastroesophageal junction 2.   Medium sized hiatal hernia 3.   Non-erosive gastritis (inflammation) was found; multiple biopsies 4.   The duodenal mucosa showed no abnormalities  RECOMMENDATIONS: NO OBVIOUS SOURCE FOR ANEMIA IDENTIFIED.MAY NEED CAPSULE TO COMPLETE EVALUATION.  AWAIT BIOPSY.  CONTINUE OMEPRAZOLE. OPV IN  3 MOS.      _______________________________ Rosalie DoctorJonette Eva, MD 02/27/2012 10:38 AM      PATIENT NAME:  Victoria Vasquez, Victoria Vasquez MR#: [id number]

## 2012-02-27 NOTE — Op Note (Signed)
[facility] [address] [place], [address]   COLONOSCOPY PROCEDURE REPORT  PATIENT: Victoria Vasquez, Victoria Vasquez  MR#: [id number] BIRTHDATE: 05-May-1928 , 84  yrs. old GENDER: Female ENDOSCOPIST: Jonette Eva, MD REFERRED QM:VHQIO Gerda Diss, M.D. PROCEDURE DATE:  02/27/2012 PROCEDURE:   Colonoscopy, surveillance INDICATIONS:anemia, non-specific.  ON NSAIDS. HEME NEGx3 MEDICATIONS: Demerol 50 mg IV and Versed 5 mg IV  DESCRIPTION OF PROCEDURE:    Physical exam was performed.  Informed consent was obtained from the patient after explaining the benefits, risks, and alternatives to procedure.  The patient was connected to monitor and placed in left lateral position. Continuous oxygen was provided by nasal cannula and IV medicine administered through an indwelling cannula.  After administration of sedation and rectal exam, the patients rectum was intubated and the Pentax Colonoscope [id number]  colonoscope was advanced under direct visualization to the cecum.  The scope was removed slowly by carefully examining the color, texture, anatomy, and integrity mucosa on the way out.  The patient was recovered in endoscopy and discharged home in satisfactory condition.       COLON FINDINGS: Moderate diverticulosis was noted in the sigmoid colon.  , The colon mucosa was otherwise normal.  , and Small internal hemorrhoids were found.    REDUNDNAT TRANSVERSE/SIGMOID COLON. UNABLE TOINTUBATE THE ILEUM.  PREP QUALITY: good. CECAL W/D TIME: 15 minutes  COMPLICATIONS: None  ENDOSCOPIC IMPRESSION: 1.   Moderate diverticulosis was noted in the sigmoid colon 2.   The colon mucosa was otherwise normal 3.   Small internal hemorrhoids   RECOMMENDATIONS: HIGH FIBER DIET NEXT TCS WITH AN OVERTUBE       _______________________________ eSignedJonette Eva, MD 02/27/2012 10:19 AM

## 2012-03-01 ENCOUNTER — Telehealth: Payer: Self-pay | Admitting: Gastroenterology

## 2012-03-01 NOTE — Telephone Encounter (Signed)
Path faxed to PCP, recall made 

## 2012-03-01 NOTE — Telephone Encounter (Signed)
Please call pt. HER stomach Bx shows gastritis.   CONTINUE OMEPRAZOLE EVERY MORNING.  FOLLOW A HIGH FIBER DIET. AVOID ITEMS THAT CAUSE BLOATING.   FOLLOW UP IN 3 MOS. WE WILL RECHECK YOUR BLOOD COUNT.

## 2012-03-02 NOTE — Telephone Encounter (Signed)
Called and informed pt.  

## 2012-03-27 NOTE — Progress Notes (Signed)
REPEAT CBC AT NEXT VISIT. IF HB LOW, CONSIDER CAPSULE ENDOSCOPY.

## 2012-03-27 NOTE — Progress Notes (Signed)
REVIEWED.  SEP 2013 HB 11.8 HEMR NEGx3  EGD/DIL OCT 2013 GASTRITIS TCS OCT  2013 TICS/IH

## 2012-05-04 ENCOUNTER — Encounter: Payer: Self-pay | Admitting: *Deleted

## 2012-08-17 ENCOUNTER — Encounter: Payer: Self-pay | Admitting: *Deleted

## 2012-08-18 ENCOUNTER — Ambulatory Visit (INDEPENDENT_AMBULATORY_CARE_PROVIDER_SITE_OTHER): Payer: Medicare Other | Admitting: Family Medicine

## 2012-08-18 ENCOUNTER — Encounter: Payer: Self-pay | Admitting: Family Medicine

## 2012-08-18 VITALS — BP 188/106 | HR 80 | Ht 61.0 in | Wt 173.8 lb

## 2012-08-18 DIAGNOSIS — I10 Essential (primary) hypertension: Secondary | ICD-10-CM

## 2012-08-18 DIAGNOSIS — E119 Type 2 diabetes mellitus without complications: Secondary | ICD-10-CM

## 2012-08-18 LAB — BASIC METABOLIC PANEL
Calcium: 9.9 mg/dL (ref 8.4–10.5)
Glucose, Bld: 135 mg/dL — ABNORMAL HIGH (ref 70–99)
Sodium: 141 mEq/L (ref 135–145)

## 2012-08-18 LAB — HEMOGLOBIN A1C
Hgb A1c MFr Bld: 7.7 % — ABNORMAL HIGH (ref <5.7)
Mean Plasma Glucose: 174 mg/dL — ABNORMAL HIGH (ref <117)

## 2012-08-18 NOTE — Patient Instructions (Signed)
Please do your lab work in the near future.  Start using lisinopril 5 mg, take 2 tablets each morning. Call us next week [phone] to tell the nurses how your blood pressures are doing. Followup with [place] in 2 weeks' time.  You may use some salt but don't go overboard.

## 2012-08-18 NOTE — Progress Notes (Signed)
  Subjective:    Patient ID: Victoria Vasquez, female    DOB: 1927/12/23, 79 y.o.   MRN: [id number]  Hypertension This is a chronic problem. The current episode started more than 1 month ago. The problem has been gradually worsening since onset. The problem is uncontrolled. Associated symptoms include headaches. (Dizziness) There are no associated agents to hypertension. Risk factors for coronary artery disease include diabetes mellitus. Past treatments include angiotensin blockers. The current treatment provides no improvement. There are no compliance problems.    She relates over the past couple months her blood pressures, but concerned her. She also states when she gets up she feels dizzy no unilateral numbness or weakness.   Review of Systems  Neurological: Positive for headaches.   Denies chest pressure shortness of breath swelling in the legs. She relates compliance with her medicines.    Objective:   Physical Exam  Blood pressure elevated it was checked twice it was also checked with her machine. Her machine does correlate with our machine. Best reading 168/102 Lungs are clear no crackles heart regular no murmurs pulses are normal Abdomen is soft extremities no edema foot exam normal      Assessment & Plan:  DIABETES MELLITUS, TYPE II - Plan: Hemoglobin A1c  HYPERTENSION - Plan: Basic metabolic panel  And in her in the meantime she is to use 2 lisinopril tablets each morning until she follows up. She will check her blood pressures call us next week with those readings she will followup in 2 weeks' time.

## 2012-08-25 ENCOUNTER — Telehealth: Payer: Self-pay | Admitting: Family Medicine

## 2012-08-25 NOTE — Telephone Encounter (Signed)
Called to let Dr. Lorin Picket know her Blood Pressure is 181/104 today, yesterday it was 177/102.  Patient states she has double the medication as requested by Dr.  Please call patient with further instructions.

## 2012-08-25 NOTE — Telephone Encounter (Signed)
Add lozol 1.25 qam    #30 4 refills, this is a mild diuresing check it should help with the other medicine to bring her blood pressure in a more reasonable range,bring all meds to follow up visit ( should have one somewhere within the next few weeks)

## 2012-08-26 NOTE — Telephone Encounter (Signed)
Discussed with patient. Med called into [facility] Patient stated she has office visit scheduled in a few weeks for follow up.

## 2012-09-01 ENCOUNTER — Ambulatory Visit (INDEPENDENT_AMBULATORY_CARE_PROVIDER_SITE_OTHER): Payer: Medicare Other | Admitting: Family Medicine

## 2012-09-01 ENCOUNTER — Encounter: Payer: Self-pay | Admitting: Family Medicine

## 2012-09-01 VITALS — BP 148/88 | Wt 173.0 lb

## 2012-09-01 DIAGNOSIS — E119 Type 2 diabetes mellitus without complications: Secondary | ICD-10-CM

## 2012-09-01 DIAGNOSIS — R0602 Shortness of breath: Secondary | ICD-10-CM

## 2012-09-01 DIAGNOSIS — I10 Essential (primary) hypertension: Secondary | ICD-10-CM

## 2012-09-01 DIAGNOSIS — R079 Chest pain, unspecified: Secondary | ICD-10-CM

## 2012-09-01 MED ORDER — GLIPIZIDE 5 MG PO TABS: ORAL_TABLET | ORAL | Status: DC

## 2012-09-01 NOTE — Patient Instructions (Signed)
Need to see evening glucose look better. I would recommend glipizide take a full tablet in the morning(5mg ) and a half of a tablet at suppertime.  As for your blood pressure I recommend: Reduce lisinopril. Take a 5 mg tablet in the morning. Continue Indapramide (Lozol) one each morning.  Followup here in 3 months.  We will set up appointment with cardiology in our office will call you with the time

## 2012-09-01 NOTE — Progress Notes (Signed)
  Subjective:    Patient ID: Victoria Vasquez, female    DOB: [birth year], 79 y.o.   MRN: [id number]  HPIPatient comes in today her main concern is she feels fatigued tired dizzy. She states that whenever she does anything she gets out of energy quickly she does get little short of breath with walking she also states occasionally has a funny feeling across her chest. She denies any sweats chills or cyanosis. Denies any swelling in the legs. In additionstates whenever she stands for length of time she does get dizzy. Recently we added Lozol 1.25 mg every morning because her blood pressures are running significantly elevated. Patient states that the dizziness does get worse with standing not as bad when she is sitting she states she just doesn't feel like her usual self she relates her appetite is good she does admit that her sugars are running higher in the evening than what they used to be.  History of diabetes hypertension refluxAnd hyperlipidemia. Patient does not smoke.    Review of Systems See above. No abdominal pain no fevers chills cough or wheezing. No hemoptysis.      Objective:   Physical Exam Vital signs are noted. Blood pressure was checked both sitting and standing and then checked 2 minutes after standing longer and she actually had a significant drop in blood pressure at that point. I also had her walk a lap around the office hallways and she did have some shortness of breath with this but denied any chest pressure abdomen soft extremities no edema. EKG does have some nonspecific T-wave inversions in the lateral leads.       Assessment & Plan:  Atypical chest discomfort-she has many risk factors including age lipidemia diabetes I recommend cardiology referral she may end up needing to have a echo and possibly some form of stress testing. I doubt she could walk long on the treadmill at all. Diabetes subpar control and adjust medicationThe glipizide I recommend a full tablet in the  morning and a half in the evening. She may continue the metformin half tablet twice a day. We will see her back in 3 months Hypertension-I. Believe her regimen might be too aggressive. Reduce lisinopril 5 mg tablet just take one in the morning. Continue Lozol 1.25 each morning.

## 2012-09-08 ENCOUNTER — Ambulatory Visit (INDEPENDENT_AMBULATORY_CARE_PROVIDER_SITE_OTHER): Payer: Medicare Other | Admitting: Cardiology

## 2012-09-08 ENCOUNTER — Ambulatory Visit (HOSPITAL_COMMUNITY)
Admission: RE | Admit: 2012-09-08 | Discharge: 2012-09-08 | Disposition: A | Discharge status: Home / Self Care | Payer: Medicare Other | Source: Ambulatory Visit | Attending: Cardiology | Admitting: Cardiology

## 2012-09-08 ENCOUNTER — Encounter: Payer: Self-pay | Admitting: Cardiology

## 2012-09-08 VITALS — BP 122/79 | HR 93 | Ht 65.0 in | Wt 171.8 lb

## 2012-09-08 DIAGNOSIS — R0989 Other specified symptoms and signs involving the circulatory and respiratory systems: Secondary | ICD-10-CM

## 2012-09-08 DIAGNOSIS — R079 Chest pain, unspecified: Secondary | ICD-10-CM | POA: Insufficient documentation

## 2012-09-08 DIAGNOSIS — E785 Hyperlipidemia, unspecified: Secondary | ICD-10-CM | POA: Insufficient documentation

## 2012-09-08 DIAGNOSIS — N183 Chronic kidney disease, stage 3 (moderate): Secondary | ICD-10-CM

## 2012-09-08 DIAGNOSIS — J449 Chronic obstructive pulmonary disease, unspecified: Secondary | ICD-10-CM

## 2012-09-08 DIAGNOSIS — I10 Essential (primary) hypertension: Secondary | ICD-10-CM | POA: Insufficient documentation

## 2012-09-08 DIAGNOSIS — M4000 Postural kyphosis, site unspecified: Secondary | ICD-10-CM | POA: Insufficient documentation

## 2012-09-08 DIAGNOSIS — D649 Anemia, unspecified: Secondary | ICD-10-CM

## 2012-09-08 DIAGNOSIS — R0600 Dyspnea, unspecified: Secondary | ICD-10-CM

## 2012-09-08 DIAGNOSIS — E1122 Type 2 diabetes mellitus with diabetic chronic kidney disease: Secondary | ICD-10-CM | POA: Insufficient documentation

## 2012-09-08 DIAGNOSIS — E1165 Type 2 diabetes mellitus with hyperglycemia: Secondary | ICD-10-CM

## 2012-09-08 DIAGNOSIS — R0609 Other forms of dyspnea: Secondary | ICD-10-CM | POA: Insufficient documentation

## 2012-09-08 DIAGNOSIS — K449 Diaphragmatic hernia without obstruction or gangrene: Secondary | ICD-10-CM | POA: Insufficient documentation

## 2012-09-08 LAB — BRAIN NATRIURETIC PEPTIDE: Brain Natriuretic Peptide: 137.7 pg/mL — ABNORMAL HIGH (ref 0.0–100.0)

## 2012-09-08 LAB — COMPREHENSIVE METABOLIC PANEL
ALT: 10 U/L (ref 0–35)
CO2: 29 mEq/L (ref 19–32)
Sodium: 139 mEq/L (ref 135–145)
Total Bilirubin: 0.4 mg/dL (ref 0.3–1.2)
Total Protein: 7.4 g/dL (ref 6.0–8.3)

## 2012-09-08 LAB — CBC
HCT: 40.7 % (ref 36.0–46.0)
MCHC: 33.9 g/dL (ref 30.0–36.0)
MCV: 89.8 fL (ref 78.0–100.0)
RDW: 13.5 % (ref 11.5–15.5)

## 2012-09-08 MED ORDER — VERAPAMIL HCL ER 180 MG PO CP24: 180.0000 mg | ORAL_CAPSULE | Freq: Every day | ORAL | Status: DC

## 2012-09-08 NOTE — Assessment & Plan Note (Signed)
Dyspnea has been evaluated previously without any significant physiologic cause identified. She now has accompanying dizziness and weakness with a modest but significant orthostatic change in blood pressure. Diuretic will be discontinued, as this may be contributing to orthostasis. Basic laboratory studies including a metabolic profile, CBC, TSH, d-dimer, BNP level, chest x-ray and echocardiogram will be obtained. I will reassess this nice woman in 2 weeks, once these studies have been completed.

## 2012-09-08 NOTE — Progress Notes (Signed)
Patient ID: Victoria Vasquez, female   DOB: April 22, 1928, 79 y.o.   MRN: [id number] HPI: Initial Cardiology evaluation for this nice older woman, who was formerly a patient of mine, and who now is referred by Babs Sciara, MD for assessment of lightheadedness, weakness and exertional dyspnea.  I previously was asked to evaluate [name] for dyspnea in 2010 at which time no specific etiology was identified. She has no history of lung disease, tobacco use or cardiac disease. She was found to have anemia a few years ago, but subsequent CBCs have been normal.  The most recent thyroid testing available to me is from 2010.  Patient is generally inactive, but notes dyspnea with mild exertion. She has profound weakness and easy fatigability. She notes lightheadedness and instability of gait, but has not fallen nor lost consciousness. She does not describe vertigo. She does not have hearing impairment or significant tinnitus.  Current Outpatient Prescriptions on File Prior to Visit  Medication Sig Dispense Refill  . aspirin 325 MG EC tablet Take 325 mg by mouth daily.      Marland Kitchen atorvastatin (LIPITOR) 10 MG tablet Take 1 tablet by mouth 3 (three) times a week. Take on Monday, Wednesday, and Friday      . budesonide-formoterol (SYMBICORT) 80-4.5 MCG/ACT inhaler Inhale 2 puffs into the lungs daily as needed. Shortness of breath      . glipiZIDE (GLUCOTROL) 5 MG tablet 1  Every morning and 1/2 at supper  135 tablet  3  . indapamide (LOZOL) 1.25 MG tablet Take 1.25 mg by mouth daily.       Marland Kitchen lisinopril (PRINIVIL,ZESTRIL) 5 MG tablet Take 5 mg by mouth daily.       . metFORMIN (GLUCOPHAGE) 500 MG tablet Take 500 mg by mouth. 1/2 tablet BID      . Multiple Vitamins-Minerals (ICAPS PO) Take by mouth.      Marland Kitchen omeprazole (PRILOSEC) 20 MG capsule Take 20 mg by mouth daily.        No current facility-administered medications on file prior to visit.   No Known Allergies  Past Medical History  Diagnosis Date  . Diabetes  mellitus, type II   . Hypertension   . Hyperlipidemia   . Fatigue   . Shingles   . COPD (chronic obstructive pulmonary disease)     Exertional dyspnea; PFTs in 2002: Moderate small airway obstruction, mildly reduced lung volumes and DLCO, nl ABG  . Scoliosis   . Chest pain     Evaluation prior to 2003 reportedly including cath-records never located; Echo in 2009: Mild LVH, LAE2, nl EF, moderate LAE, MR1-2  . Parotid mass 2003    Right-2003    Past Surgical History  Procedure Laterality Date  . Cholecystectomy  2003  . Appendectomy  1951  . Colonoscopy  04/16/2006    [facility]-Normal colon.  Exam limited due to retained particulate matter in the lumen of the colon, polyps less than 1 cm would have been easily missed.  Otherwise, no polyps, masses, inflammatory changes or vascular ectasia seen/ Normal retroflexed view of the rectum..  recommend repeat exam at 03/2011  . Orif  1996  . Orif ankle fracture  1992    Family History  Problem Relation Age of Onset  . Stomach cancer Mother   . Alzheimer's disease Sister   . Colon cancer Neg Hx     History   Social History  . Marital Status: Widowed    Spouse Name: N/A  Number of Children: 3  . Years of Education: N/A   Occupational History  . Retired    Social History Main Topics  . Smoking status: Passive Smoke Exposure - Never Smoker  . Smokeless tobacco: Not on file  . Alcohol Use: No  . Drug Use: No  . Sexually Active: Not on file   Other Topics Concern  . Not on file   Social History Narrative   Widowed   3 children   No regular exercise    ROS: Denies chest pain, orthopnea, PND, palpitations or syncope..  All other systems reviewed and are negative.  PHYSICAL EXAM: BP 122/79  Pulse 93  Ht 5\' 5"  (1.651 m)  Wt 77.928 kg (171 lb 12.8 oz)  BMI 28.59 kg/m2;  Body mass index is 28.59 kg/(m^2).   General-Well-developed; no acute distress Body Habitus-proportionate weight and height HEENT-[place]/AT; PERRL; EOM intact;  conjunctiva and lids nl Neck-No JVD; no carotid bruits Endocrine-No thyromegaly Lungs-Clear lung fields; resonant percussion; normal I-to-E ratio Cardiovascular- normal PMI; normal S1 and S2 Abdomen-BS normal; soft and non-tender without masses or organomegaly Musculoskeletal-No deformities, cyanosis or clubbing Neurologic-Nl cranial nerves; symmetric strength and tone Skin- Warm, no significant lesions Extremities-Nl distal pulses; no edema  EKG:  Normal sinus rhythm; delayed R-wave progression; shallow anterolateral and inferolateral T-wave inversion.  Since previous tracing performed 12/31/2011, anterolateral T-wave inversions are less prominent.  [place] Bing, MD 09/08/2012  2:23 PM  ASSESSMENT AND PLAN

## 2012-09-08 NOTE — Assessment & Plan Note (Signed)
Blood pressure is good in the office today, but there is a significant orthostatic change. Nonetheless, her standing blood pressure should not result in the dizziness that she reports. Diuretic will be discontinued and verapamil 180 mg per day substituted. Patient will monitor blood pressure at home and report any significant elevations or any increase in her symptomatology.

## 2012-09-08 NOTE — Progress Notes (Deleted)
Name: Victoria Vasquez    DOB: [birth year]  Age: 79 y.o.  MR#: [id number]       PCP:  Victoria Punt, MD      Insurance: Payor: [employer]  Plan: BLUE MEDICARE  Product Type: *No Product type*    CC:   No chief complaint on file.  BOTTLES VS Filed Vitals:   09/08/12 1337 09/08/12 1338 09/08/12 1340 09/08/12 1341  BP: 129/79 122/82 124/80 122/79  Pulse: 85 97 92 93  Height:      Weight:        Weights Current Weight  09/08/12 171 lb 12.8 oz (77.928 kg)  09/01/12 173 lb (78.472 kg)  08/18/12 173 lb 12.8 oz (78.835 kg)    Blood Pressure  BP Readings from Last 3 Encounters:  09/08/12 122/79  09/01/12 148/88  08/18/12 188/106     Admit date:  (Not on file) Last encounter with RMR:  Visit date not found   Allergy Review of patient's allergies indicates no known allergies.  Current Outpatient Prescriptions  Medication Sig Dispense Refill  . aspirin 325 MG EC tablet Take 325 mg by mouth daily.      Marland Kitchen atorvastatin (LIPITOR) 10 MG tablet Take 1 tablet by mouth 3 (three) times a week. Take on Monday, Wednesday, and Friday      . budesonide-formoterol (SYMBICORT) 80-4.5 MCG/ACT inhaler Inhale 2 puffs into the lungs daily as needed. Shortness of breath      . glipiZIDE (GLUCOTROL) 5 MG tablet 1  Every morning and 1/2 at supper  135 tablet  3  . indapamide (LOZOL) 1.25 MG tablet Take 1.25 mg by mouth daily.       Marland Kitchen lisinopril (PRINIVIL,ZESTRIL) 5 MG tablet Take 5 mg by mouth daily.       . metFORMIN (GLUCOPHAGE) 500 MG tablet Take 500 mg by mouth. 1/2 tablet BID      . Multiple Vitamins-Minerals (ICAPS PO) Take by mouth.      Marland Kitchen omeprazole (PRILOSEC) 20 MG capsule Take 20 mg by mouth daily.        No current facility-administered medications for this visit.    Discontinued Meds:   There are no discontinued medications.  Patient Active Problem List  Diagnosis  . Normocytic anemia  . GERD (gastroesophageal reflux disease)  . Diabetes mellitus, type II  .  Hypertension  . Hyperlipidemia  . COPD (chronic obstructive pulmonary disease)  . Chest pain    LABS    Component Value Date/Time   NA 141 08/18/2012 1017   NA 141 12/31/2011 1444   NA 141 08/30/2008   K 5.0 08/18/2012 1017   K 4.4 12/31/2011 1444   K 4.1 08/30/2008   CL 105 08/18/2012 1017   CL 105 12/31/2011 1444   CL 105 08/30/2008   CO2 28 08/18/2012 1017   CO2 25 12/31/2011 1444   CO2 25 08/30/2008   GLUCOSE 135* 08/18/2012 1017   GLUCOSE 210* 12/31/2011 1444   BUN 19 08/18/2012 1017   BUN 19 12/31/2011 1444   BUN 24 08/30/2008   CREATININE 1.14* 08/18/2012 1017   CREATININE 1.04 12/31/2011 1444   CREATININE 1.35 08/30/2008   CALCIUM 9.9 08/18/2012 1017   CALCIUM 9.9 12/31/2011 1444   GFRNONAA 49* 12/31/2011 1444   GFRAA 57* 12/31/2011 1444   CMP     Component Value Date/Time   NA 141 08/18/2012 1017   K 5.0 08/18/2012 1017   CL 105 08/18/2012 1017  CO2 28 08/18/2012 1017   GLUCOSE 135* 08/18/2012 1017   BUN 19 08/18/2012 1017   CREATININE 1.14* 08/18/2012 1017   CREATININE 1.04 12/31/2011 1444   CALCIUM 9.9 08/18/2012 1017   PROT 5.7 01/22/2012 1316   AST 14 01/22/2012 1316   AST 14 09/04/2008   ALT 13 01/22/2012 1316   ALKPHOS 48 01/22/2012 1316   ALKPHOS 79 09/04/2008   BILITOT 0.4 01/22/2012 1316   BILITOT 0.8 09/04/2008   GFRNONAA 49* 12/31/2011 1444   GFRAA 57* 12/31/2011 1444       Component Value Date/Time   WBC 7.7 12/31/2011 1444   WBC 6.7 01/01/2009 2207   HGB 11.8* 01/22/2012 1316   HGB 12.6 12/31/2011 1444   HGB 13.4 01/01/2009 2207   HCT 36 01/22/2012 1316   HCT 38.2 12/31/2011 1444   HCT 40.3 01/01/2009 2207   MCV 91.0 12/31/2011 1444   MCV 91.4 01/01/2009 2207    Lipid Panel     Component Value Date/Time   CHOL 175 09/04/2008   HDL 40 09/04/2008   LDLCALC 100 09/04/2008    ABG No results found for this basename: phart, pco2, pco2art, po2, po2art, hco3, tco2, acidbasedef, o2sat     Lab Results  Component Value Date   TSH 1.145 01/01/2009   BNP (last 3 results) No results found for this  basename: PROBNP,  in the last 8760 hours Cardiac Panel (last 3 results) No results found for this basename: CKTOTAL, CKMB, TROPONINI, RELINDX,  in the last 72 hours  Iron/TIBC/Ferritin No results found for this basename: iron, tibc, ferritin     EKG Orders placed during the hospital encounter of 12/31/11  . ED EKG  . ED EKG  . EKG 12-LEAD  . EKG 12-LEAD  . EKG     Prior Assessment and Plan Problem List as of 09/08/2012     ICD-9-CM   Normocytic anemia   Last Assessment & Plan   02/11/2012 Office Visit Edited 02/11/2012 11:33 AM by Joselyn Arrow, NP     Victoria Vasquez is a pleasant 79 y.o. female with mild normocytic anemia. Hemoccult negative today. No gross GI bleeding. Her iron and ferritin are low normal.  In light of the fact that she is overdue for colonoscopy given incomplete exam over 5 years ago, she is going to need a colonoscopy. She'll also need an EGD given her upper GI symptoms. I have discussed risks & benefits which include, but are not limited to, bleeding, infection, perforation & drug reaction.  The patient agrees with this plan & written consent will be obtained.  She is at risk for GI injury given recent use of prednisone, Aleve, and aspirin.  Hemoccult stools x3 to look for occult GI bleeding Do not take ALEVE or other antiinflammatories or aspirin like Advil, Ibuprofen, Goodys, etc Continue aspirin daily Take half of Glucophage and glipizide the day prior to the procedures Hold diabetes medications day of procedure Bring all medications and/or any insulin to the hospital the day of the procedure. Follow blood sugars, call us or your PCP if any problems.      GERD (gastroesophageal reflux disease)   Last Assessment & Plan   02/11/2012 Office Visit Written 02/11/2012 11:31 AM by Joselyn Arrow, NP      Refractory GERD and globus despite Prilosec 20 mg daily. Symptoms may be exacerbated by recent prednisone and NSAID along with aspirin. Differentials include  NSAID induced gastritis, H. pylori and peptic ulcer disease.  EGD with Dr. Darrick Penna.  Continue Prilosec 20 milligrams daily     Diabetes mellitus, type II   Hypertension   Hyperlipidemia   COPD (chronic obstructive pulmonary disease)   Chest pain       Imaging: No results found.

## 2012-09-08 NOTE — Patient Instructions (Addendum)
Your physician recommends that you schedule a follow-up appointment in: 2 WEEKS  Your physician has requested that you regularly monitor and record your blood pressure readings at home. Please use the same machine at the same time of day to check your readings and record them to bring to your follow-up visit.  Your physician has requested that you have an echocardiogram. Echocardiography is a painless test that uses sound waves to create images of your heart. It provides your doctor with information about the size and shape of your heart and how well your heart's chambers and valves are working. This procedure takes approximately one hour. There are no restrictions for this procedure.  A chest x-ray takes a picture of the organs and structures inside the chest, including the heart, lungs, and blood vessels. This test can show several things, including, whether the heart is enlarges; whether fluid is building up in the lungs; and whether pacemaker / defibrillator leads are still in place.  Your physician recommends that you return for lab work in: TODAY (CMET,TSH,CBC,D-DIMER,BNP) SLIPS GIVEN  PLEASE CALL THE OFFICE IF YOU NOTE AND INCREASE IN Victoria Vasquez OR SYNCOPE [phone]  Your physician has recommended you make the following change in your medication:   1) STOP TAKING INDAPAMIDE  2) START TAKING VERAPAMIL CD 180MG  ONCE DAILY

## 2012-09-08 NOTE — Assessment & Plan Note (Signed)
12/2011: H&H-11.8/36, MCV-91

## 2012-09-08 NOTE — Assessment & Plan Note (Signed)
Modest hyperlipidemia in the past. Patient currently receiving atorvastatin at low dose. We will seek a more recent assessment of serum lipids.

## 2012-09-08 NOTE — Assessment & Plan Note (Signed)
Current chest discomfort is infrequent, atypical and does not raise serious concern for coronary disease. I suspect she will ultimately require stress testing, but this will be deferred for the time being.

## 2012-09-09 ENCOUNTER — Encounter: Payer: Self-pay | Admitting: Cardiology

## 2012-09-09 LAB — TSH: TSH: 1.604 u[IU]/mL (ref 0.350–4.500)

## 2012-09-13 ENCOUNTER — Telehealth: Payer: Self-pay | Admitting: Family Medicine

## 2012-09-13 ENCOUNTER — Telehealth: Payer: Self-pay | Admitting: *Deleted

## 2012-09-13 NOTE — Telephone Encounter (Signed)
Discussed with patient

## 2012-09-13 NOTE — Telephone Encounter (Signed)
Tell patient that the bloodwork that her cardiologist did overall looked good. He the cardiologist will be doing a echo of the heart. In addition to this patient should go back to the original diabetic dosing that we were using. She should followup with the cardiologist as planned because of her passing out he may do additional testing then she should followup here once the cardiologist is finished with her.

## 2012-09-13 NOTE — Telephone Encounter (Signed)
Patient says she thinks that doubling her diabetes medicine was too much because she passed out on Saturday because she thinks her sugar got too low.

## 2012-09-13 NOTE — Telephone Encounter (Signed)
Left message return call at [phone]

## 2012-09-13 NOTE — Telephone Encounter (Signed)
Pt states glyburide was increased from 1/2 tablet to 1 tablet. She took one tablet thurs am and about 1:30 started having nausea and weakness and passed out. She went back to taking just 1/2 tablet after that and she still feels dizzy and weak. Please advise.

## 2012-09-13 NOTE — Telephone Encounter (Signed)
Pt returned call for results and wanted to alert Dr Macarthur Critchley that she passed out on Thursday due to low blood sugar, PCP Luking decreased pt glipizide to accommodate the concern, however PCP Luking still wanted the pt to alert Dr Macarthur Critchley that she did pass out, pt reports that she is feeling good today with no sxs of concerns, pt advised to call office with any further questions if neccessary

## 2012-09-14 ENCOUNTER — Ambulatory Visit (HOSPITAL_COMMUNITY)
Admission: RE | Admit: 2012-09-14 | Discharge: 2012-09-14 | Disposition: A | Discharge status: Home / Self Care | Payer: Medicare Other | Source: Ambulatory Visit | Attending: Cardiology | Admitting: Cardiology

## 2012-09-14 ENCOUNTER — Encounter: Payer: Self-pay | Admitting: Cardiology

## 2012-09-14 DIAGNOSIS — I517 Cardiomegaly: Secondary | ICD-10-CM

## 2012-09-14 DIAGNOSIS — E119 Type 2 diabetes mellitus without complications: Secondary | ICD-10-CM | POA: Insufficient documentation

## 2012-09-14 DIAGNOSIS — R0989 Other specified symptoms and signs involving the circulatory and respiratory systems: Secondary | ICD-10-CM | POA: Insufficient documentation

## 2012-09-14 DIAGNOSIS — R55 Syncope and collapse: Secondary | ICD-10-CM | POA: Insufficient documentation

## 2012-09-14 DIAGNOSIS — R0609 Other forms of dyspnea: Secondary | ICD-10-CM | POA: Insufficient documentation

## 2012-09-14 DIAGNOSIS — R0600 Dyspnea, unspecified: Secondary | ICD-10-CM

## 2012-09-14 DIAGNOSIS — I10 Essential (primary) hypertension: Secondary | ICD-10-CM | POA: Insufficient documentation

## 2012-09-14 DIAGNOSIS — R079 Chest pain, unspecified: Secondary | ICD-10-CM | POA: Insufficient documentation

## 2012-09-14 NOTE — Telephone Encounter (Signed)
Noted.  Jennings Bing, MD

## 2012-09-14 NOTE — Progress Notes (Signed)
*  PRELIMINARY RESULTS* Echocardiogram 2D Echocardiogram has been performed.  Victoria Vasquez 09/14/2012, 11:39 AM

## 2012-09-23 ENCOUNTER — Ambulatory Visit (INDEPENDENT_AMBULATORY_CARE_PROVIDER_SITE_OTHER): Payer: Medicare Other | Admitting: Cardiology

## 2012-09-23 ENCOUNTER — Encounter: Payer: Self-pay | Admitting: Cardiology

## 2012-09-23 ENCOUNTER — Encounter: Payer: Self-pay | Admitting: *Deleted

## 2012-09-23 VITALS — BP 175/90 | HR 74 | Ht 65.0 in | Wt 177.0 lb

## 2012-09-23 DIAGNOSIS — I10 Essential (primary) hypertension: Secondary | ICD-10-CM

## 2012-09-23 DIAGNOSIS — E876 Hypokalemia: Secondary | ICD-10-CM

## 2012-09-23 DIAGNOSIS — D649 Anemia, unspecified: Secondary | ICD-10-CM

## 2012-09-23 DIAGNOSIS — R079 Chest pain, unspecified: Secondary | ICD-10-CM

## 2012-09-23 DIAGNOSIS — R55 Syncope and collapse: Secondary | ICD-10-CM

## 2012-09-23 NOTE — Patient Instructions (Addendum)
Your physician recommends that you schedule a follow-up appointment in: 2 WEEKS  Your physician has requested that you have a lexiscan myoview. For further information please visit [email or web address] Please follow instruction sheet, as given.  Your physician has recommended that you wear a holter monitor. Holter monitors are medical devices that record the heart's electrical activity. Doctors most often use these monitors to diagnose arrhythmias. Arrhythmias are problems with the speed or rhythm of the heartbeat. The monitor is a small, portable device. You can wear one while you do your normal daily activities. This is usually used to diagnose what is causing palpitations/syncope (passing out)48 HOURS  Your physician has recommended you make the following change in your medication:   1) STOP LIPITOR   Your physician recommends YOU INCREASE POTASSIUM IN YOUR DIET AS FOLLOWS:  Foods Rich in Potassium The body needs potassium to:  Control blood pressure.   Keep the muscles healthy.   Keep the nervous system healthy.  Most foods contain potassium. Eating a variety of foods in the right amounts will help control the level of potassium in your body.  Food / Potassium (mg)  Apricots, dried,  cup / 378 mg   Apricots, raw, 1 cup halves / 401 mg   Avocado,  / 487 mg   Banana, 1 large / 487 mg   Beef, lean, round, 3 oz / 202 mg   Cantaloupe, 1 cup cubes / 427 mg   Dates, medjool, 5 whole / 835 mg   Ham, cured, 3 oz / 212 mg   Lentils, dried,  cup / 458 mg   Lima beans, frozen,  cup / 258 mg   Orange, 1 large / 333 mg   Orange juice, 1 cup / 443 mg   Peaches, dried,  cup / 398 mg   Peas, split, cooked,  cup / 355 mg   Potato, boiled, 1 medium / 515 mg   Prunes, dried, uncooked,  cup / 318 mg   Raisins,  cup / 309 mg   Salmon, pink, raw, 3 oz / 275 mg   Sardines, canned , 3 oz / 338 mg   Tomato, raw, 1 medium / 292 mg   Tomato juice, 6 oz / 417 mg    [place], 3 oz / 349 mg  Other Foods High in Potassium (greater than 250 mg):  Bran cereals and other bran products.   Milk (skim, 1%, 2%, whole).   Buttermilk.   Yogurt.   Nuts.   Dried fruits.   Cherries.   Sweet potatoes.   Oranges.   Baked Beans.   Broccoli.   Spinach.   Peanut butter.   Tofu.  Foods Lower in Potassium (less than 250 mg):  Pasta.   Rice.   Cottage cheese.   Cheddar cheese.   Apples.   Mango.   Grapes.   Grapefruit.   Pineapple.   Raspberries.   Strawberries.   Watermelon.   Green Beans.   Cabbage.   Carrots.   Cauliflower.   Celery.   Corn.    Mushrooms.   Onions.   Squash.   Eggs.  The list below tells you how big or small some common portion sizes are:  1 oz.........4 stacked dice.   3 oz........Marland KitchenDeck of cards.   1 tsp.......Marland KitchenTip of little finger.   1 tbs......Marland KitchenMarland KitchenThumb.   2 tbs.......Marland KitchenGolf ball.    cup......Marland KitchenHalf of a fist.   1 cup.......Marland KitchenA fist.  Document Released: 10/22/2007 Document Revised: 01/15/2011 Document Reviewed: 09/18/2008  Exit[employer] Patient Information 2012 [place].

## 2012-09-23 NOTE — Progress Notes (Signed)
Patient ID: COLLYN SELK, female   DOB: [birth year], 79 y.o.   MRN: [id number]  HPI: Schedule return visit for this nice woman with hypertension, hyperlipidemia, and diabetes, but no known vascular disease. She is continued to do well from a symptomatic standpoint, but notes stable class II dyspnea on exertion as well as an episode of syncope. She has monitored blood pressures at home with intermittent mild elevation noted.  Current Outpatient Prescriptions  Medication Sig Dispense Refill  . aspirin 325 MG EC tablet Take 325 mg by mouth daily.      Marland Kitchen atorvastatin (LIPITOR) 10 MG tablet Take 1 tablet by mouth 3 (three) times a week. Take on Monday, Wednesday, and Friday      . budesonide-formoterol (SYMBICORT) 80-4.5 MCG/ACT inhaler Inhale 2 puffs into the lungs daily as needed. Shortness of breath      . glipiZIDE (GLUCOTROL) 5 MG tablet Take 2.5 mg by mouth 2 (two) times daily before a meal.       . lisinopril (PRINIVIL,ZESTRIL) 5 MG tablet Take 5 mg by mouth daily.       . metFORMIN (GLUCOPHAGE) 500 MG tablet Take 250 mg by mouth 2 (two) times daily with a meal.       . Multiple Vitamins-Minerals (ICAPS PO) Take by mouth.      Marland Kitchen omeprazole (PRILOSEC) 20 MG capsule Take 20 mg by mouth daily.       . verapamil (VERELAN PM) 180 MG 24 hr capsule Take 1 capsule (180 mg total) by mouth at bedtime.  30 capsule  5   No current facility-administered medications for this visit.   No Known Allergies   Past medical history, social history, and family history reviewed and updated.  ROS: Denies orthopnea, chest pain, palpitations, lightheadedness or syncope. All other systems reviewed and are negative.  PHYSICAL EXAM: BP 132/68  Pulse 72  Ht 5\' 5"  (1.651 m)  Wt 80.287 kg (177 lb)  BMI 29.45 kg/m2  SpO2 97%;  Body mass index is 29.45 kg/(m^2). General-Well developed; no acute distress Body habitus-mildly overweight Neck-No JVD; no carotid bruits Lungs-clear lung fields; resonant to percussion;  kyphosis Cardiovascular-normal PMI; normal S1 and S2; modest systolic ejection murmur Abdomen-normal bowel sounds; soft and non-tender without masses or organomegaly Musculoskeletal-No deformities, no cyanosis or clubbing Neurologic-Normal cranial nerves; symmetric strength and tone Skin-Warm, no significant lesions Extremities-distal pulses intact; no edema  Golva Bing, MD 09/23/2012  3:42 PM  ASSESSMENT AND PLAN

## 2012-09-23 NOTE — Progress Notes (Deleted)
Name: Victoria Vasquez    DOB: [birth year]  Age: 79 y.o.  MR#: [id number]       PCP:  Lilyan Punt, MD      Insurance: Payor: BLUE CROSS BLUE SHIELD OF  MEDICARE  Plan: BLUE MEDICARE  Product Type: *No Product type*    CC:   No chief complaint on file. brought list and BP   VS Filed Vitals:   09/23/12 1448  BP: 132/68  Pulse: 72  Height: 5\' 5"  (1.651 m)  Weight: 177 lb (80.287 kg)  SpO2: 97%    Weights Current Weight  09/23/12 177 lb (80.287 kg)  09/08/12 171 lb 12.8 oz (77.928 kg)  09/01/12 173 lb (78.472 kg)    Blood Pressure  BP Readings from Last 3 Encounters:  09/23/12 132/68  09/08/12 122/79  09/01/12 148/88     Admit date:  (Not on file) Last encounter with RMR:  09/08/2012   Allergy Review of patient's allergies indicates no known allergies.  Current Outpatient Prescriptions  Medication Sig Dispense Refill  . aspirin 325 MG EC tablet Take 325 mg by mouth daily.      Marland Kitchen atorvastatin (LIPITOR) 10 MG tablet Take 1 tablet by mouth 3 (three) times a week. Take on Monday, Wednesday, and Friday      . budesonide-formoterol (SYMBICORT) 80-4.5 MCG/ACT inhaler Inhale 2 puffs into the lungs daily as needed. Shortness of breath      . glipiZIDE (GLUCOTROL) 5 MG tablet Take 2.5 mg by mouth 2 (two) times daily before a meal.       . lisinopril (PRINIVIL,ZESTRIL) 5 MG tablet Take 5 mg by mouth daily.       . metFORMIN (GLUCOPHAGE) 500 MG tablet Take 250 mg by mouth 2 (two) times daily with a meal.       . Multiple Vitamins-Minerals (ICAPS PO) Take by mouth.      Marland Kitchen omeprazole (PRILOSEC) 20 MG capsule Take 20 mg by mouth daily.       . verapamil (VERELAN PM) 180 MG 24 hr capsule Take 1 capsule (180 mg total) by mouth at bedtime.  30 capsule  5   No current facility-administered medications for this visit.    Discontinued Meds:    Medications Discontinued During This Encounter  Medication Reason  . glipiZIDE (GLUCOTROL) 5 MG tablet     Patient Active Problem List    Diagnosis Date Noted  . Syncope 09/14/2012  . Dyspnea on exertion 09/08/2012  . Diabetes mellitus, type II   . Hypertension   . Hyperlipidemia   . Chest pain   . Normocytic anemia 02/11/2012  . GERD (gastroesophageal reflux disease) 02/11/2012    LABS    Component Value Date/Time   NA 139 09/08/2012 1429   NA 141 08/18/2012 1017   NA 141 12/31/2011 1444   K 3.5 09/08/2012 1429   K 5.0 08/18/2012 1017   K 4.4 12/31/2011 1444   CL 99 09/08/2012 1429   CL 105 08/18/2012 1017   CL 105 12/31/2011 1444   CO2 29 09/08/2012 1429   CO2 28 08/18/2012 1017   CO2 25 12/31/2011 1444   GLUCOSE 204* 09/08/2012 1429   GLUCOSE 135* 08/18/2012 1017   GLUCOSE 210* 12/31/2011 1444   BUN 23 09/08/2012 1429   BUN 19 08/18/2012 1017   BUN 19 12/31/2011 1444   CREATININE 1.17* 09/08/2012 1429   CREATININE 1.14* 08/18/2012 1017   CREATININE 1.04 12/31/2011 1444   CREATININE 1.35 08/30/2008   CALCIUM 9.7  09/08/2012 1429   CALCIUM 9.9 08/18/2012 1017   CALCIUM 9.9 12/31/2011 1444   GFRNONAA 49* 12/31/2011 1444   GFRAA 57* 12/31/2011 1444   CMP     Component Value Date/Time   NA 139 09/08/2012 1429   K 3.5 09/08/2012 1429   CL 99 09/08/2012 1429   CO2 29 09/08/2012 1429   GLUCOSE 204* 09/08/2012 1429   BUN 23 09/08/2012 1429   CREATININE 1.17* 09/08/2012 1429   CREATININE 1.04 12/31/2011 1444   CALCIUM 9.7 09/08/2012 1429   PROT 7.4 09/08/2012 1429   PROT 5.7 01/22/2012 1316   ALBUMIN 4.5 09/08/2012 1429   AST 16 09/08/2012 1429   AST 14 01/22/2012 1316   ALT 10 09/08/2012 1429   ALKPHOS 91 09/08/2012 1429   ALKPHOS 48 01/22/2012 1316   BILITOT 0.4 09/08/2012 1429   BILITOT 0.4 01/22/2012 1316   GFRNONAA 49* 12/31/2011 1444   GFRAA 57* 12/31/2011 1444       Component Value Date/Time   WBC 8.6 09/08/2012 1429   WBC 7.7 12/31/2011 1444   WBC 6.7 01/01/2009 2207   HGB 13.8 09/08/2012 1429   HGB 11.8* 01/22/2012 1316   HGB 12.6 12/31/2011 1444   HCT 40.7 09/08/2012 1429   HCT 36 01/22/2012 1316   HCT 38.2 12/31/2011 1444   HCT 40.3  01/01/2009 2207   MCV 89.8 09/08/2012 1429   MCV 91.0 12/31/2011 1444   MCV 91.4 01/01/2009 2207    Lipid Panel     Component Value Date/Time   CHOL 175 09/04/2008   HDL 40 09/04/2008   LDLCALC 100 09/04/2008    ABG No results found for this basename: phart, pco2, pco2art, po2, po2art, hco3, tco2, acidbasedef, o2sat     Lab Results  Component Value Date   TSH 1.604 09/08/2012   BNP (last 3 results) No results found for this basename: PROBNP,  in the last 8760 hours Cardiac Panel (last 3 results) No results found for this basename: CKTOTAL, CKMB, TROPONINI, RELINDX,  in the last 72 hours  Iron/TIBC/Ferritin No results found for this basename: iron, tibc, ferritin     EKG Orders placed in visit on 09/08/12  . EKG 12-LEAD     Prior Assessment and Plan Problem List as of 09/23/2012     ICD-9-CM   Normocytic anemia   Last Assessment & Plan   09/08/2012 Office Visit Written 09/08/2012  5:37 PM by Kathlen Brunswick, MD     12/2011: H&H-11.8/36, MCV-91    GERD (gastroesophageal reflux disease)   Last Assessment & Plan   02/11/2012 Office Visit Written 02/11/2012 11:31 AM by Joselyn Arrow, NP      Refractory GERD and globus despite Prilosec 20 mg daily. Symptoms may be exacerbated by recent prednisone and NSAID along with aspirin. Differentials include NSAID induced gastritis, H. pylori and peptic ulcer disease.  EGD with Dr. Darrick Penna.  Continue Prilosec 20 milligrams daily     Diabetes mellitus, type II   Hypertension   Last Assessment & Plan   09/08/2012 Office Visit Written 09/08/2012  2:33 PM by Kathlen Brunswick, MD     Blood pressure is good in the office today, but there is a significant orthostatic change. Nonetheless, her standing blood pressure should not result in the dizziness that she reports. Diuretic will be discontinued and verapamil 180 mg per day substituted. Patient will monitor blood pressure at home and report any significant elevations or any increase in her  symptomatology.  Hyperlipidemia   Last Assessment & Plan   09/08/2012 Office Visit Written 09/08/2012  5:33 PM by Kathlen Brunswick, MD     Modest hyperlipidemia in the past. Patient currently receiving atorvastatin at low dose. We will seek a more recent assessment of serum lipids.    Chest pain   Last Assessment & Plan   09/08/2012 Office Visit Written 09/08/2012  2:30 PM by Kathlen Brunswick, MD     Current chest discomfort is infrequent, atypical and does not raise serious concern for coronary disease. I suspect she will ultimately require stress testing, but this will be deferred for the time being.    Dyspnea on exertion   Last Assessment & Plan   09/08/2012 Office Visit Written 09/08/2012  2:32 PM by Kathlen Brunswick, MD     Dyspnea has been evaluated previously without any significant physiologic cause identified. She now has accompanying dizziness and weakness with a modest but significant orthostatic change in blood pressure. Diuretic will be discontinued, as this may be contributing to orthostasis. Basic laboratory studies including a metabolic profile, CBC, TSH, d-dimer, BNP level, chest x-ray and echocardiogram will be obtained. I will reassess this nice woman in 2 weeks, once these studies have been completed.    Syncope       Imaging: Dg Chest 2 View  09/08/2012  *RADIOLOGY REPORT*  Clinical Data: Dyspnea  CHEST - 2 VIEW  Comparison: None.  Findings: Normal mediastinum and heart silhouette.  Moderate size hiatal hernia noted.  No effusion, infiltrate, or pneumothorax. There is kyphosis of the thoracic spine.  IMPRESSION:  1.  No acute cardiopulmonary findings.  2.  Hiatal hernia. 3.  Kyphosis.   Original Report Authenticated By: Genevive Bi, M.D.

## 2012-09-27 ENCOUNTER — Ambulatory Visit (HOSPITAL_COMMUNITY)
Admission: RE | Admit: 2012-09-27 | Discharge: 2012-09-27 | Disposition: A | Discharge status: Home / Self Care | Payer: Medicare Other | Source: Ambulatory Visit | Attending: Cardiology | Admitting: Cardiology

## 2012-09-27 DIAGNOSIS — R002 Palpitations: Secondary | ICD-10-CM

## 2012-09-27 DIAGNOSIS — R079 Chest pain, unspecified: Secondary | ICD-10-CM | POA: Insufficient documentation

## 2012-09-27 NOTE — Progress Notes (Signed)
*  PRELIMINARY RESULTS* Echocardiogram 48H Holter Monitor has been performed.  Victoria Vasquez 09/27/2012, 2:32 PM

## 2012-09-29 DIAGNOSIS — E876 Hypokalemia: Secondary | ICD-10-CM | POA: Insufficient documentation

## 2012-09-29 NOTE — Assessment & Plan Note (Signed)
Anemia has resolved.  

## 2012-09-29 NOTE — Assessment & Plan Note (Addendum)
Recent syncope is of some concern, but episode is vague, and loss of consciousness is not definite. In light of her history of chest discomfort and exertional dyspnea, we will proceed with a stress nuclear study to evaluate for possible coronary artery disease. Holter monitoring will also be performed.

## 2012-09-29 NOTE — Assessment & Plan Note (Signed)
Recent blood pressure determinations over the past 2 months have been excellent; current medication will be continued.

## 2012-09-29 NOTE — Assessment & Plan Note (Addendum)
With very mild and asymptomatic hypokalemia, our initial approach will be to increase potassium in diet.

## 2012-09-30 ENCOUNTER — Ambulatory Visit (HOSPITAL_COMMUNITY)
Admission: RE | Admit: 2012-09-30 | Discharge: 2012-09-30 | Disposition: A | Discharge status: Home / Self Care | Payer: Medicare Other | Source: Ambulatory Visit | Attending: Cardiology | Admitting: Cardiology

## 2012-09-30 ENCOUNTER — Encounter (HOSPITAL_COMMUNITY)
Admission: RE | Admit: 2012-09-30 | Discharge: 2012-09-30 | Disposition: A | Discharge status: Home / Self Care | Payer: Medicare Other | Source: Ambulatory Visit | Attending: Cardiology | Admitting: Cardiology

## 2012-09-30 ENCOUNTER — Encounter (HOSPITAL_COMMUNITY): Payer: Self-pay

## 2012-09-30 DIAGNOSIS — I10 Essential (primary) hypertension: Secondary | ICD-10-CM | POA: Insufficient documentation

## 2012-09-30 DIAGNOSIS — R079 Chest pain, unspecified: Secondary | ICD-10-CM | POA: Insufficient documentation

## 2012-09-30 DIAGNOSIS — R0602 Shortness of breath: Secondary | ICD-10-CM

## 2012-09-30 DIAGNOSIS — R0989 Other specified symptoms and signs involving the circulatory and respiratory systems: Secondary | ICD-10-CM | POA: Insufficient documentation

## 2012-09-30 DIAGNOSIS — R55 Syncope and collapse: Secondary | ICD-10-CM | POA: Insufficient documentation

## 2012-09-30 DIAGNOSIS — R0609 Other forms of dyspnea: Secondary | ICD-10-CM | POA: Insufficient documentation

## 2012-09-30 HISTORY — DX: Unspecified asthma, uncomplicated: J45.909

## 2012-09-30 MED ORDER — TECHNETIUM TC 99M SESTAMIBI - CARDIOLITE
10.0000 | Freq: Once | INTRAVENOUS | Status: AC | PRN
  Administered 2012-09-30: 09:00:00 10 via INTRAVENOUS

## 2012-09-30 MED ORDER — SODIUM CHLORIDE 0.9 % IJ SOLN
INTRAMUSCULAR | Status: AC
  Administered 2012-09-30: 10 mL via INTRAVENOUS
  Filled 2012-09-30: qty 10

## 2012-09-30 MED ORDER — TECHNETIUM TC 99M SESTAMIBI - CARDIOLITE
30.0000 | Freq: Once | INTRAVENOUS | Status: AC | PRN
  Administered 2012-09-30: 11:00:00 30 via INTRAVENOUS

## 2012-09-30 MED ORDER — REGADENOSON 0.4 MG/5ML IV SOLN
INTRAVENOUS | Status: AC
  Administered 2012-09-30: 0.4 mg via INTRAVENOUS
  Filled 2012-09-30: qty 5

## 2012-09-30 NOTE — Progress Notes (Signed)
Stress Lab Nurses Notes - Victoria Vasquez  Victoria Vasquez 09/30/2012 Reason for doing test: Chest Pain and Syncope Type of test: Marlane Hatcher Nurse performing test: Parke Poisson, RN Nuclear Medicine Tech: Lyndel Pleasure Echo Tech: Not Applicable MD performing test: R. Rothbart & Joni Reining NP Family AV:WUJWJ Luking  Test explained and consent signed: yes IV started: 22g jelco, Saline lock flushed, No redness or edema and Saline lock started in radiology Symptoms: Nausea & Headache Treatment/Intervention: None Reason test stopped: protocol completed After recovery IV was: Discontinued via X-ray tech and No redness or edema Patient to return to Nuc. Med at : 11:15 Patient discharged: Home Patient's Condition upon discharge was: stable Comments: During test BP 18/79 & HR 109.  Recovery  BP 156/79 & HR 90.  Symptoms resolved in recovery. Erskine Speed T

## 2012-10-05 ENCOUNTER — Telehealth: Payer: Self-pay | Admitting: Cardiology

## 2012-10-05 NOTE — Telephone Encounter (Signed)
Will forward to [place]  

## 2012-10-05 NOTE — Telephone Encounter (Signed)
Patient Victoria Vasquez is calling to get her lab results.  Please give her a call at your earliest convenience.  She is at [phone]

## 2012-10-06 NOTE — Telephone Encounter (Signed)
.  left message to have patient return my call.  

## 2012-10-07 NOTE — Telephone Encounter (Signed)
Spoke to pt to advise results/instructions. Pt understood. For myoview and blood test

## 2012-10-12 ENCOUNTER — Encounter: Payer: Self-pay | Admitting: Cardiology

## 2012-10-12 ENCOUNTER — Ambulatory Visit (INDEPENDENT_AMBULATORY_CARE_PROVIDER_SITE_OTHER): Payer: Medicare Other | Admitting: Cardiology

## 2012-10-12 VITALS — BP 157/72 | HR 67 | Ht 65.0 in | Wt 174.5 lb

## 2012-10-12 DIAGNOSIS — R55 Syncope and collapse: Secondary | ICD-10-CM

## 2012-10-12 DIAGNOSIS — R079 Chest pain, unspecified: Secondary | ICD-10-CM

## 2012-10-12 DIAGNOSIS — R404 Transient alteration of awareness: Secondary | ICD-10-CM

## 2012-10-12 DIAGNOSIS — G4700 Insomnia, unspecified: Secondary | ICD-10-CM

## 2012-10-12 DIAGNOSIS — I10 Essential (primary) hypertension: Secondary | ICD-10-CM

## 2012-10-12 DIAGNOSIS — E119 Type 2 diabetes mellitus without complications: Secondary | ICD-10-CM

## 2012-10-12 DIAGNOSIS — R400 Somnolence: Secondary | ICD-10-CM | POA: Insufficient documentation

## 2012-10-12 DIAGNOSIS — R0989 Other specified symptoms and signs involving the circulatory and respiratory systems: Secondary | ICD-10-CM

## 2012-10-12 MED ORDER — VERAPAMIL HCL ER 240 MG PO TBCR: 240.0000 mg | EXTENDED_RELEASE_TABLET | Freq: Every day | ORAL | Status: DC

## 2012-10-12 MED ORDER — LISINOPRIL 20 MG PO TABS: 20.0000 mg | ORAL_TABLET | Freq: Every day | ORAL | Status: DC

## 2012-10-12 NOTE — Assessment & Plan Note (Addendum)
Patient denies recent chest discomfort. She believes that she has undergone coronary angiography, but a record of same has never been located. No compelling symptoms at present to suggest the need for further testing related to possible coronary artery disease.

## 2012-10-12 NOTE — Assessment & Plan Note (Signed)
Blood pressure control is suboptimal. Dose of verapamil will be increased to 240 mg per day and lisinopril to 20 mg per day. Patient will monitor blood pressures at home and call for elevated values.

## 2012-10-12 NOTE — Assessment & Plan Note (Signed)
Patient monitors capillary blood glucose and has never documented significant hypoglycemia.

## 2012-10-12 NOTE — Progress Notes (Deleted)
Name: Victoria Vasquez    DOB: Jan 19, 1928  Age: 79 y.o.  MR#: [id number]       PCP:  Lilyan Punt, MD      Insurance: Payor: BLUE CROSS BLUE [employer] [place] MEDICARE / Plan: BLUE MEDICARE / Product Type: *No Product type* /   CC:   No chief complaint on file. LIST PT NOTES SHE HAS BEEN FEELING WEAK EVERYDAY FOR A YEAR 30-60 MINUTES AFTER SHE TAKES HER MORNING MEDICATIONS AT 9:30AM, NOTES FEELS LIKE HER LEGS ARE GOING TO FALL OUT FROM UNDER HER, WANTS TO KNOW IF MEDICATIONS COULD BE DOING THIS  VS Filed Vitals:   10/12/12 1438  BP: 157/72  Pulse: 67  Height: 5\' 5"  (1.651 m)  Weight: 174 lb 8 oz (79.153 kg)    Weights Current Weight  10/12/12 174 lb 8 oz (79.153 kg)  09/23/12 177 lb (80.287 kg)  09/08/12 171 lb 12.8 oz (77.928 kg)    Blood Pressure  BP Readings from Last 3 Encounters:  10/12/12 157/72  09/23/12 175/90  09/08/12 122/79     Admit date:  (Not on file) Last encounter with RMR:  10/05/2012   Allergy Review of patient's allergies indicates no known allergies.  Current Outpatient Prescriptions  Medication Sig Dispense Refill  . aspirin 325 MG EC tablet Take 325 mg by mouth daily.      . budesonide-formoterol (SYMBICORT) 80-4.5 MCG/ACT inhaler Inhale 2 puffs into the lungs daily as needed. Shortness of breath      . glipiZIDE (GLUCOTROL) 5 MG tablet Take 2.5 mg by mouth 2 (two) times daily before a meal.       . lisinopril (PRINIVIL,ZESTRIL) 5 MG tablet Take 5 mg by mouth daily.       . metFORMIN (GLUCOPHAGE) 500 MG tablet Take 250 mg by mouth 2 (two) times daily with a meal.       . Multiple Vitamins-Minerals (ICAPS PO) Take by mouth.      Marland Kitchen omeprazole (PRILOSEC) 20 MG capsule Take 20 mg by mouth daily.       . verapamil (VERELAN PM) 180 MG 24 hr capsule Take 1 capsule (180 mg total) by mouth at bedtime.  30 capsule  5   No current facility-administered medications for this visit.    Discontinued Meds:    Medications Discontinued During This Encounter  Medication  Reason  . atorvastatin (LIPITOR) 10 MG tablet Error    Patient Active Problem List   Diagnosis Date Noted  . Hypokalemia 09/29/2012  . Syncope 09/14/2012  . Dyspnea on exertion 09/08/2012  . Diabetes mellitus, type II   . Hypertension   . Hyperlipidemia   . Chest pain   . Normocytic anemia-resolved 02/11/2012  . GERD (gastroesophageal reflux disease) 02/11/2012    LABS    Component Value Date/Time   NA 139 09/08/2012 1429   NA 141 08/18/2012 1017   NA 141 12/31/2011 1444   K 3.5 09/08/2012 1429   K 5.0 08/18/2012 1017   K 4.4 12/31/2011 1444   CL 99 09/08/2012 1429   CL 105 08/18/2012 1017   CL 105 12/31/2011 1444   CO2 29 09/08/2012 1429   CO2 28 08/18/2012 1017   CO2 25 12/31/2011 1444   GLUCOSE 204* 09/08/2012 1429   GLUCOSE 135* 08/18/2012 1017   GLUCOSE 210* 12/31/2011 1444   BUN 23 09/08/2012 1429   BUN 19 08/18/2012 1017   BUN 19 12/31/2011 1444   CREATININE 1.17* 09/08/2012 1429   CREATININE 1.14*  08/18/2012 1017   CREATININE 1.04 12/31/2011 1444   CREATININE 1.35 08/30/2008   CALCIUM 9.7 09/08/2012 1429   CALCIUM 9.9 08/18/2012 1017   CALCIUM 9.9 12/31/2011 1444   GFRNONAA 49* 12/31/2011 1444   GFRAA 57* 12/31/2011 1444   CMP     Component Value Date/Time   NA 139 09/08/2012 1429   K 3.5 09/08/2012 1429   CL 99 09/08/2012 1429   CO2 29 09/08/2012 1429   GLUCOSE 204* 09/08/2012 1429   BUN 23 09/08/2012 1429   CREATININE 1.17* 09/08/2012 1429   CREATININE 1.04 12/31/2011 1444   CALCIUM 9.7 09/08/2012 1429   PROT 7.4 09/08/2012 1429   PROT 5.7 01/22/2012 1316   ALBUMIN 4.5 09/08/2012 1429   AST 16 09/08/2012 1429   AST 14 01/22/2012 1316   ALT 10 09/08/2012 1429   ALKPHOS 91 09/08/2012 1429   ALKPHOS 48 01/22/2012 1316   BILITOT 0.4 09/08/2012 1429   BILITOT 0.4 01/22/2012 1316   GFRNONAA 49* 12/31/2011 1444   GFRAA 57* 12/31/2011 1444       Component Value Date/Time   WBC 8.6 09/08/2012 1429   WBC 7.7 12/31/2011 1444   WBC 6.7 01/01/2009 2207   HGB 13.8 09/08/2012 1429   HGB 11.8* 01/22/2012  1316   HGB 12.6 12/31/2011 1444   HCT 40.7 09/08/2012 1429   HCT 36 01/22/2012 1316   HCT 38.2 12/31/2011 1444   HCT 40.3 01/01/2009 2207   MCV 89.8 09/08/2012 1429   MCV 91.0 12/31/2011 1444   MCV 91.4 01/01/2009 2207    Lipid Panel     Component Value Date/Time   CHOL 175 09/04/2008   HDL 40 09/04/2008   LDLCALC 100 09/04/2008    ABG No results found for this basename: phart, pco2, pco2art, po2, po2art, hco3, tco2, acidbasedef, o2sat     Lab Results  Component Value Date   TSH 1.604 09/08/2012   BNP (last 3 results) No results found for this basename: PROBNP,  in the last 8760 hours Cardiac Panel (last 3 results) No results found for this basename: CKTOTAL, CKMB, TROPONINI, RELINDX,  in the last 72 hours  Iron/TIBC/Ferritin No results found for this basename: iron, tibc, ferritin     EKG Orders placed during the hospital encounter of 09/27/12  . HOLTER MONITOR - 48 HOUR  . HOLTER MONITOR - 48 HOUR     Prior Assessment and Plan Problem List as of 10/12/2012   Normocytic anemia-resolved   Last Assessment & Plan   09/23/2012 Office Visit Written 09/29/2012 11:21 AM by Kathlen Brunswick, MD     Anemia has resolved.    GERD (gastroesophageal reflux disease)   Last Assessment & Plan   02/11/2012 Office Visit Written 02/11/2012 11:31 AM by Joselyn Arrow, NP      Refractory GERD and globus despite Prilosec 20 mg daily. Symptoms may be exacerbated by recent prednisone and NSAID along with aspirin. Differentials include NSAID induced gastritis, H. pylori and peptic ulcer disease.  EGD with Dr. Darrick Penna.  Continue Prilosec 20 milligrams daily     Diabetes mellitus, type II   Hypertension   Last Assessment & Plan   09/23/2012 Office Visit Written 09/29/2012 11:20 AM by Kathlen Brunswick, MD     Recent blood pressure determinations over the past 2 months have been excellent; current medication will be continued.    Hyperlipidemia   Last Assessment & Plan   09/08/2012 Office Visit Written  09/08/2012  5:33 PM by Molly Maduro  Rosebud Poles, MD     Modest hyperlipidemia in the past. Patient currently receiving atorvastatin at low dose. We will seek a more recent assessment of serum lipids.    Chest pain   Last Assessment & Plan   09/08/2012 Office Visit Written 09/08/2012  2:30 PM by Kathlen Brunswick, MD     Current chest discomfort is infrequent, atypical and does not raise serious concern for coronary disease. I suspect she will ultimately require stress testing, but this will be deferred for the time being.    Dyspnea on exertion   Last Assessment & Plan   09/08/2012 Office Visit Written 09/08/2012  2:32 PM by Kathlen Brunswick, MD     Dyspnea has been evaluated previously without any significant physiologic cause identified. She now has accompanying dizziness and weakness with a modest but significant orthostatic change in blood pressure. Diuretic will be discontinued, as this may be contributing to orthostasis. Basic laboratory studies including a metabolic profile, CBC, TSH, d-dimer, BNP level, chest x-ray and echocardiogram will be obtained. I will reassess this nice woman in 2 weeks, once these studies have been completed.    Syncope   Last Assessment & Plan   09/23/2012 Office Visit Written 09/29/2012 11:19 AM by Kathlen Brunswick, MD     Reason syncope is of some concern, but episode is vague, and loss of consciousness is not definite. In light of her history of chest discomfort and exertional dyspnea, we will proceed with a stress nuclear study to evaluate for possible coronary artery disease. Holter monitoring will also be performed.    Hypokalemia   Last Assessment & Plan   09/23/2012 Office Visit Written 09/29/2012 11:18 AM by Kathlen Brunswick, MD     With very mild and asymptomatic hypokalemia, or initial approach will be to increase potassium in diet.        Imaging: Nm Myocar Single W/spect W/wall Motion And Ef  10/01/2012   nm myoview pharmacologic stress  Ordering Physician:  [place] Bing  Reading Physician: [place] Bing  Clinical Data: 79 year old woman with multiple cardiovascular risk factors and dyspnea on exertion.  NUCLEAR MEDICINE ADENOSINE STRESS MYOVIEW STUDY WITH SPECT AND LEFT VENTRIUCLAR EJECTION FRACTION  Radionuclide Data: One-day rest/stress protocol performed with 10/30 mCi of Tc-30m Myoview.  Stress Data: Regadenoson infusion proceeded without incident. There was a moderate increase in heart rate but no change in systolic blood pressure with drug administration.  No arrhythmias noted.  EKG: Normal sinus rhythm; delayed R-wave progression; decreased voltage; rightward axis; inferior T-wave inversion.  No significant change with pharmacologic stress.  Scintigraphic Data: Acquisition notable for imaging with the patient's arms at her side.  The left ventricular size was normal. On tomographic images reconstructed in standard planes, there was a small and mild defect in the basilar and mid inferolateral segment with no reversibility.  The gated reconstruction demonstrated normal regional and global LV systolic function as well as normal systolic accentuation of activity throughout.  Estimated ejection fraction was 66%.  IMPRESSION: Negative pharmacologic stress nuclear myocardial study revealing normal left ventricular size and function, a small area of probable soft tissue attenuation without convincing evidence for ischemia or infarction.  Other findings as noted.   Original Report Authenticated By: [place] Bing

## 2012-10-12 NOTE — Patient Instructions (Addendum)
Your physician recommends that you schedule a follow-up appointment in: POST SLEEP STUDY  Your physician has recommended that you have a sleep study. This test records several body functions during sleep, including: brain activity, eye movement, oxygen and carbon dioxide blood levels, heart rate and rhythm, breathing rate and rhythm, the flow of air through your mouth and nose, snoring, body muscle movements, and chest and belly movement.A staff member from our office will alert you the with appointment date and time, once available  Your physician has requested that you regularly monitor and record your blood pressure readings at home. Please use the same machine at the same time of day to check your readings and record them to bring to your follow-up visit.  Your physician has recommended you make the following change in your medication:   1) INCREASE LISINOPRIL TO 20MG  ONCE DAILY 2) INCREASE VERAPAMIL TO 240MG  ONCE DAILY WITH YOUR NEXT REFILL

## 2012-10-12 NOTE — Assessment & Plan Note (Signed)
Exertional dyspnea is chronic and likely multifactorial with contributors including her overweight status, physical deconditioning, and perhaps hypertensive heart disease.

## 2012-10-12 NOTE — Progress Notes (Signed)
Patient ID: Victoria Vasquez, female   DOB: June 27, 1927, 78 y.o.   MRN: [id number]  HPI: Schedule return visit for continuing evaluation of fatigue. Recent TSH and CBC are normal. Holter monitoring revealed no significant arrhythmias. A stress nuclear study was negative. Patient reports continuing symptoms, that have been present for approximately one year. She also notes daytime somnolence and reports that she snores at night. She falls asleep spontaneously when she attempts to read.  Current Outpatient Prescriptions  Medication Sig Dispense Refill  . aspirin 325 MG EC tablet Take 325 mg by mouth daily.      . budesonide-formoterol (SYMBICORT) 80-4.5 MCG/ACT inhaler Inhale 2 puffs into the lungs daily as needed. Shortness of breath      . glipiZIDE (GLUCOTROL) 5 MG tablet Take 2.5 mg by mouth 2 (two) times daily before a meal.       . metFORMIN (GLUCOPHAGE) 500 MG tablet Take 250 mg by mouth 2 (two) times daily with a meal.       . Multiple Vitamins-Minerals (ICAPS PO) Take by mouth.      Marland Kitchen omeprazole (PRILOSEC) 20 MG capsule Take 20 mg by mouth daily.       Marland Kitchen lisinopril (PRINIVIL,ZESTRIL) 20 MG tablet Take 1 tablet (20 mg total) by mouth daily.  90 tablet  1  . verapamil (CALAN-SR) 240 MG CR tablet Take 1 tablet (240 mg total) by mouth at bedtime.  90 tablet  1   No current facility-administered medications for this visit.   No Known Allergies   Past medical history, social history, and family history reviewed and updated.  ROS: Mild peripheral edema; denies chest pain, dyspnea, palpitations, lightheadedness or syncope. All other systems reviewed and are negative.  PHYSICAL EXAM: BP 157/72  Pulse 67  Ht 5\' 5"  (1.651 m)  Wt 79.153 kg (174 lb 8 oz)  BMI 29.04 kg/m2;  Body mass index is 29.04 kg/(m^2).  Repeat blood pressure: 145/75 General-Well developed; no acute distress Body habitus-mildly to moderately overweight Neck-No JVD; no carotid bruits Lungs-clear lung fields; resonant to  percussion Cardiovascular-normal PMI; normal S1 and S2 Abdomen-normal bowel sounds; soft and non-tender without masses or organomegaly Musculoskeletal-No deformities, no cyanosis or clubbing Neurologic-Normal cranial nerves; symmetric strength and tone Skin-Warm, no significant lesions Extremities-distal pulses intact; 1/2+ ankle edema  [place] Bing, MD 10/12/2012  4:33 PM  ASSESSMENT AND PLAN

## 2012-10-12 NOTE — Assessment & Plan Note (Signed)
Polysomnography to be performed.

## 2012-10-12 NOTE — Assessment & Plan Note (Addendum)
No recurrent episodes of lightheadedness or syncope since an isolated event 1 month ago.

## 2012-10-14 ENCOUNTER — Other Ambulatory Visit: Payer: Self-pay | Admitting: Cardiology

## 2012-10-14 DIAGNOSIS — G4730 Sleep apnea, unspecified: Secondary | ICD-10-CM

## 2012-10-21 ENCOUNTER — Ambulatory Visit: Payer: Medicare Other | Attending: Cardiology | Admitting: Sleep Medicine

## 2012-10-21 VITALS — Ht 65.0 in | Wt 173.0 lb

## 2012-10-21 DIAGNOSIS — G4710 Hypersomnia, unspecified: Secondary | ICD-10-CM | POA: Insufficient documentation

## 2012-10-21 DIAGNOSIS — G4730 Sleep apnea, unspecified: Secondary | ICD-10-CM | POA: Insufficient documentation

## 2012-10-21 DIAGNOSIS — Z6829 Body mass index (BMI) 29.0-29.9, adult: Secondary | ICD-10-CM | POA: Insufficient documentation

## 2012-10-21 DIAGNOSIS — R5381 Other malaise: Secondary | ICD-10-CM | POA: Insufficient documentation

## 2012-10-22 NOTE — Procedures (Signed)
[facility] Oiva Dibari A. Gerilyn Pilgrim, MD     [email or web address]        NAME:  Victoria Vasquez, Victoria Vasquez                ACCOUNT NO.:  [id number]  MEDICAL RECORD NO.:  [id number]          PATIENT TYPE:  OUT  LOCATION:  SLEEP LAB                     FACILITY:  [facility]  PHYSICIAN:  Valinda Fedie A. Gerilyn Pilgrim, M.D. DATE OF BIRTH:  1928/04/04  DATE OF STUDY:  10/21/2012                           NOCTURNAL POLYSOMNOGRAM  REFERRING PHYSICIAN:  Scott A. Gerda Diss, MD  INDICATION:  An 79 year old female who presents with loud snoring, fatigue, and daytime sleepiness.   MEDICATIONS:  Glipizide, lisinopril, aspirin, metformin, omeprazole, Symbicort, verapamil.  EPWORTH SLEEPINESS SCALE:  8.  BMI:  29.  ARCHITECTURAL SUMMARY:  The total recording time is 387 minutes.  Sleep efficiency low at 27%.  Sleep latency is 191 minutes.  REM latency 79 minutes.  Stage N1 60%, N2 73%, N3 is 7.9%, and REM sleep 13%.  RESPIRATORY SUMMARY:  Baseline oxygen saturation is 96, lowest saturation 89 during REM sleep.  Diagnostic AHI is 1 and RDI also 1.  LIMB MOVEMENT SUMMARY:  PLM index 27.  ELECTROCARDIOGRAM SUMMARY:  Average heart rate is 66 with no significant dysrhythmias observed.  IMPRESSION: 1. Moderate periodic limb movement disorder sleep. 2. Abnormal sleep architecture with very low sleep efficiency.  No     polysomnographic etiology is revealed.  RECOMMENDATIONS:  Consider treatment of limb movements with dopamine agonist such as Mirapex or Requip.  Thank you for this referral.       Duran Ohern A. Gerilyn Pilgrim, M.D.    KAD/MEDQ  D:  10/22/2012 08:57:17  T:  10/22/2012 09:27:45  Job:  [id number]

## 2012-10-26 ENCOUNTER — Encounter: Payer: Self-pay | Admitting: Cardiology

## 2012-10-26 ENCOUNTER — Ambulatory Visit (INDEPENDENT_AMBULATORY_CARE_PROVIDER_SITE_OTHER): Payer: Medicare Other | Admitting: Cardiology

## 2012-10-26 VITALS — BP 142/72 | HR 72 | Ht 65.0 in | Wt 174.0 lb

## 2012-10-26 DIAGNOSIS — R0602 Shortness of breath: Secondary | ICD-10-CM

## 2012-10-26 NOTE — Assessment & Plan Note (Signed)
Blood pressure control is improved. This may result in a salutary effect on exercise capacity, but that would be an unexpected bonus.

## 2012-10-26 NOTE — Assessment & Plan Note (Signed)
Suboptimal sleep test fails to reveal obstructive sleep apnea. Restless leg syndrome suggested and could account for some of patient's fatigue. Dr. Luking to determine whether pharmacologic therapy is warranted. 

## 2012-10-26 NOTE — Progress Notes (Deleted)
Name: LANISHA STEPANIAN    DOB: 21-Oct-1927  Age: 79 y.o.  MR#: [id number]       PCP:  Lilyan Punt, MD      Insurance: Payor: [employer] [place] [employer]ICARE / Plan: BLUE MEDICARE / Product Type: *No Product type* /   CC:   No chief complaint on file.  list VS Filed Vitals:   10/26/12 1316  BP: 142/72  Pulse: 72  Height: 5\' 5"  (1.651 m)  Weight: 174 lb (78.926 kg)    Weights Current Weight  10/26/12 174 lb (78.926 kg)  10/21/12 173 lb (78.472 kg)  10/12/12 174 lb 8 oz (79.153 kg)    Blood Pressure  BP Readings from Last 3 Encounters:  10/26/12 142/72  10/12/12 157/72  09/23/12 175/90     Admit date:  (Not on file) Last encounter with RMR:  10/12/2012   Allergy Review of patient's allergies indicates no known allergies.  Current Outpatient Prescriptions  Medication Sig Dispense Refill  . aspirin 325 MG EC tablet Take 325 mg by mouth daily.      . budesonide-formoterol (SYMBICORT) 80-4.5 MCG/ACT inhaler Inhale 2 puffs into the lungs daily as needed. Shortness of breath      . glipiZIDE (GLUCOTROL) 5 MG tablet Take 2.5 mg by mouth 2 (two) times daily before a meal.       . lisinopril (PRINIVIL,ZESTRIL) 20 MG tablet Take 1 tablet (20 mg total) by mouth daily.  90 tablet  1  . metFORMIN (GLUCOPHAGE) 500 MG tablet Take 250 mg by mouth 2 (two) times daily with a meal.       . Multiple Vitamins-Minerals (ICAPS PO) Take by mouth.      Marland Kitchen omeprazole (PRILOSEC) 20 MG capsule Take 20 mg by mouth daily.       . verapamil (CALAN-SR) 240 MG CR tablet Take 1 tablet (240 mg total) by mouth at bedtime.  90 tablet  1   No current facility-administered medications for this visit.    Discontinued Meds:   There are no discontinued medications.  Patient Active Problem List   Diagnosis Date Noted  . Somnolence 10/12/2012  . Hypokalemia 09/29/2012  . Syncope 09/14/2012  . Dyspnea on exertion 09/08/2012  . Diabetes mellitus, type II   . Hypertension   . Hyperlipidemia   . Chest pain    . GERD (gastroesophageal reflux disease) 02/11/2012    LABS    Component Value Date/Time   NA 139 09/08/2012 1429   NA 141 08/18/2012 1017   NA 141 12/31/2011 1444   K 3.5 09/08/2012 1429   K 5.0 08/18/2012 1017   K 4.4 12/31/2011 1444   CL 99 09/08/2012 1429   CL 105 08/18/2012 1017   CL 105 12/31/2011 1444   CO2 29 09/08/2012 1429   CO2 28 08/18/2012 1017   CO2 25 12/31/2011 1444   GLUCOSE 204* 09/08/2012 1429   GLUCOSE 135* 08/18/2012 1017   GLUCOSE 210* 12/31/2011 1444   BUN 23 09/08/2012 1429   BUN 19 08/18/2012 1017   BUN 19 12/31/2011 1444   CREATININE 1.17* 09/08/2012 1429   CREATININE 1.14* 08/18/2012 1017   CREATININE 1.04 12/31/2011 1444   CREATININE 1.35 08/30/2008   CALCIUM 9.7 09/08/2012 1429   CALCIUM 9.9 08/18/2012 1017   CALCIUM 9.9 12/31/2011 1444   GFRNONAA 49* 12/31/2011 1444   GFRAA 57* 12/31/2011 1444   CMP     Component Value Date/Time   NA 139 09/08/2012 1429  K 3.5 09/08/2012 1429   CL 99 09/08/2012 1429   CO2 29 09/08/2012 1429   GLUCOSE 204* 09/08/2012 1429   BUN 23 09/08/2012 1429   CREATININE 1.17* 09/08/2012 1429   CREATININE 1.04 12/31/2011 1444   CALCIUM 9.7 09/08/2012 1429   PROT 7.4 09/08/2012 1429   PROT 5.7 01/22/2012 1316   ALBUMIN 4.5 09/08/2012 1429   AST 16 09/08/2012 1429   AST 14 01/22/2012 1316   ALT 10 09/08/2012 1429   ALKPHOS 91 09/08/2012 1429   ALKPHOS 48 01/22/2012 1316   BILITOT 0.4 09/08/2012 1429   BILITOT 0.4 01/22/2012 1316   GFRNONAA 49* 12/31/2011 1444   GFRAA 57* 12/31/2011 1444       Component Value Date/Time   WBC 8.6 09/08/2012 1429   WBC 7.7 12/31/2011 1444   WBC 6.7 01/01/2009 2207   HGB 13.8 09/08/2012 1429   HGB 11.8* 01/22/2012 1316   HGB 12.6 12/31/2011 1444   HCT 40.7 09/08/2012 1429   HCT 36 01/22/2012 1316   HCT 38.2 12/31/2011 1444   HCT 40.3 01/01/2009 2207   MCV 89.8 09/08/2012 1429   MCV 91.0 12/31/2011 1444   MCV 91.4 01/01/2009 2207    Lipid Panel     Component Value Date/Time   CHOL 175 09/04/2008   HDL 40 09/04/2008   LDLCALC 100  09/04/2008    ABG No results found for this basename: phart, pco2, pco2art, po2, po2art, hco3, tco2, acidbasedef, o2sat     Lab Results  Component Value Date   TSH 1.604 09/08/2012   BNP (last 3 results) No results found for this basename: PROBNP,  in the last 8760 hours Cardiac Panel (last 3 results) No results found for this basename: CKTOTAL, CKMB, TROPONINI, RELINDX,  in the last 72 hours  Iron/TIBC/Ferritin No results found for this basename: iron, tibc, ferritin     EKG Orders placed during the hospital encounter of 09/27/12  . HOLTER MONITOR - 48 HOUR  . HOLTER MONITOR - 48 HOUR     Prior Assessment and Plan Problem List as of 10/26/2012   GERD (gastroesophageal reflux disease)   Last Assessment & Plan   02/11/2012 Office Visit Written 02/11/2012 11:31 AM by Joselyn Arrow, NP      Refractory GERD and globus despite Prilosec 20 mg daily. Symptoms may be exacerbated by recent prednisone and NSAID along with aspirin. Differentials include NSAID induced gastritis, H. pylori and peptic ulcer disease.  EGD with Dr. Darrick Penna.  Continue Prilosec 20 milligrams daily     Diabetes mellitus, type II   Last Assessment & Plan   10/12/2012 Office Visit Written 10/12/2012  4:37 PM by Kathlen Brunswick, MD     Patient monitors capillary blood glucose and has never documented significant hypoglycemia.    Hypertension   Last Assessment & Plan   10/12/2012 Office Visit Written 10/12/2012  4:39 PM by Kathlen Brunswick, MD     Blood pressure control is suboptimal. Dose of verapamil will be increased to 240 mg per day and lisinopril to 20 mg per day. Patient will monitor blood pressures at home and call for elevated values.    Hyperlipidemia   Last Assessment & Plan   09/08/2012 Office Visit Written 09/08/2012  5:33 PM by Kathlen Brunswick, MD     Modest hyperlipidemia in the past. Patient currently receiving atorvastatin at low dose. We will seek a more recent assessment of serum lipids.     Chest pain   Last  Assessment & Plan   10/12/2012 Office Visit Edited 10/15/2012  6:02 PM by Kathlen Brunswick, MD     Patient denies recent chest discomfort. She believes that she has undergone coronary angiography, but a record of same has never been located. No compelling symptoms at present to suggest the need for further testing related to possible coronary artery disease.    Dyspnea on exertion   Last Assessment & Plan   10/12/2012 Office Visit Written 10/12/2012  4:38 PM by Kathlen Brunswick, MD     Exertional dyspnea is chronic and likely multifactorial with contributors including her overweight status, physical deconditioning, and perhaps hypertensive heart disease.    Syncope   Last Assessment & Plan   10/12/2012 Office Visit Edited 10/15/2012  6:02 PM by Kathlen Brunswick, MD     No recurrent episodes of lightheadedness or syncope since an isolated event 1 month ago.    Hypokalemia   Last Assessment & Plan   09/23/2012 Office Visit Edited 10/19/2012  9:15 PM by Kathlen Brunswick, MD     With very mild and asymptomatic hypokalemia, our initial approach will be to increase potassium in diet.    Somnolence   Last Assessment & Plan   10/12/2012 Office Visit Written 10/12/2012  4:39 PM by Kathlen Brunswick, MD     Polysomnography to be performed.        Imaging: Nm Myocar Single W/spect W/wall Motion And Ef  10/01/2012   nm myoview pharmacologic stress  Ordering Physician: [place] Bing  Reading Physician: [place] Bing  Clinical Data: 79 year old woman with multiple cardiovascular risk factors and dyspnea on exertion.  NUCLEAR MEDICINE ADENOSINE STRESS MYOVIEW STUDY WITH SPECT AND LEFT VENTRIUCLAR EJECTION FRACTION  Radionuclide Data: One-day rest/stress protocol performed with 10/30 mCi of Tc-38m Myoview.  Stress Data: Regadenoson infusion proceeded without incident. There was a moderate increase in heart rate but no change in systolic blood pressure with drug administration.  No  arrhythmias noted.  EKG: Normal sinus rhythm; delayed R-wave progression; decreased voltage; rightward axis; inferior T-wave inversion.  No significant change with pharmacologic stress.  Scintigraphic Data: Acquisition notable for imaging with the patient's arms at her side.  The left ventricular size was normal. On tomographic images reconstructed in standard planes, there was a small and mild defect in the basilar and mid inferolateral segment with no reversibility.  The gated reconstruction demonstrated normal regional and global LV systolic function as well as normal systolic accentuation of activity throughout.  Estimated ejection fraction was 66%.  IMPRESSION: Negative pharmacologic stress nuclear myocardial study revealing normal left ventricular size and function, a small area of probable soft tissue attenuation without convincing evidence for ischemia or infarction.  Other findings as noted.   Original Report Authenticated By: [place] Bing

## 2012-10-26 NOTE — Assessment & Plan Note (Signed)
Patient remains symptomatic without a single clear cause identified. Issues could simply be multifactorial with kyphosis, hypertensive heart disease, age, weight and physical activity all contributing to her exercise intolerance. Most recent PFTs were performed more than a decade ago. Significant kyphosis could be causing restrictive physiology. PFTs will be repeated with an ABG. Patient advised to gradually increase activity as tolerated.

## 2012-10-26 NOTE — Assessment & Plan Note (Deleted)
Suboptimal sleep test fails to reveal obstructive sleep apnea. Restless leg syndrome suggested and could account for some of patient's fatigue. Dr. Gerda Diss to determine whether pharmacologic therapy is warranted.

## 2012-10-26 NOTE — Progress Notes (Signed)
Patient ID: AMYRAH PINKHASOV, female   DOB: February 23, 1928, 79 y.o.   MRN: [id number]  HPI: Schedule return visit for hypertension and exertional fatigue/dyspnea. Patient reports no significant change in symptoms. A sleep study was performed, but she was awake most of the night. With approximately 90 minutes of sleep time and a three-hour sleep latency, no sleep apnea was identified. Moderately frequent periodic leg movements were noted. There is only mild and infrequent oxygen desaturation.  Current Outpatient Prescriptions  Medication Sig Dispense Refill  . aspirin 325 MG EC tablet Take 325 mg by mouth daily.      . budesonide-formoterol (SYMBICORT) 80-4.5 MCG/ACT inhaler Inhale 2 puffs into the lungs daily as needed. Shortness of breath      . glipiZIDE (GLUCOTROL) 5 MG tablet Take 2.5 mg by mouth 2 (two) times daily before a meal.       . lisinopril (PRINIVIL,ZESTRIL) 20 MG tablet Take 1 tablet (20 mg total) by mouth daily.  90 tablet  1  . metFORMIN (GLUCOPHAGE) 500 MG tablet Take 250 mg by mouth 2 (two) times daily with a meal.       . Multiple Vitamins-Minerals (ICAPS PO) Take by mouth.      Marland Kitchen omeprazole (PRILOSEC) 20 MG capsule Take 20 mg by mouth daily.       . verapamil (CALAN-SR) 240 MG CR tablet Take 1 tablet (240 mg total) by mouth at bedtime.  90 tablet  1   No current facility-administered medications for this visit.   No Known Allergies   Past medical history, social history, and family history reviewed and updated.  ROS: Denies headaches. No orthopnea nor PND. No dizziness nor loss of consciousness. All other systems reviewed and are negative.  PHYSICAL EXAM: BP 142/72  Pulse 72  Ht 5\' 5"  (1.651 m)  Wt 78.926 kg (174 lb)  BMI 28.96 kg/m2;  Body mass index is 28.96 kg/(m^2). General-Well developed; no acute distress Body habitus-mildly to moderately overweight Neck-No JVD; no carotid bruits Lungs-clear lung fields; resonant to percussion; moderate  kyphosis Cardiovascular-normal PMI; normal S1 and S2 Abdomen-normal bowel sounds; soft and non-tender without masses or organomegaly Musculoskeletal-No deformities, no cyanosis or clubbing Neurologic-Normal cranial nerves; symmetric strength and tone Skin-Warm, no significant lesions Extremities-distal pulses intact; trace edema  [place]oyd Bing, MD 10/26/2012  1:42 PM  ASSESSMENT AND PLAN

## 2012-10-26 NOTE — Patient Instructions (Addendum)
Your physician recommends that you schedule a follow-up appointment in: 6 months  Your physician has recommended that you have a pulmonary function test. Pulmonary Function Tests are a group of tests that measure how well air moves in and out of your lungs.

## 2012-11-09 ENCOUNTER — Telehealth: Payer: Self-pay | Admitting: *Deleted

## 2012-11-09 NOTE — Telephone Encounter (Signed)
PT WALKED IN TODAY, STATING THAT HER INSURANCES IS DENYING PAYMENT FOR 2-D ECHO THAT WAS DONE BECAUSE IT WAS NOT SENT FOR  PRE-CERT FIRST. I HAVE SENT A STAFF MESSAGE TO [facility] TO CHECK ON THIS FOR HER BUT WAS NOT SURE WHAT ELSE I NEEDED TO DO.

## 2012-11-10 ENCOUNTER — Ambulatory Visit (HOSPITAL_COMMUNITY)
Admission: RE | Admit: 2012-11-10 | Discharge: 2012-11-10 | Disposition: A | Discharge status: Home / Self Care | Payer: Medicare Other | Source: Ambulatory Visit | Attending: Cardiology | Admitting: Cardiology

## 2012-11-10 ENCOUNTER — Ambulatory Visit: Payer: Medicare Other | Admitting: Cardiology

## 2012-11-10 DIAGNOSIS — R0602 Shortness of breath: Secondary | ICD-10-CM | POA: Insufficient documentation

## 2012-11-10 LAB — BLOOD GAS, ARTERIAL
Bicarbonate: 22.3 mEq/L (ref 20.0–24.0)
Patient temperature: 37
TCO2: 20.2 mmol/L (ref 0–100)
pCO2 arterial: 38.9 mmHg (ref 35.0–45.0)
pH, Arterial: 7.377 (ref 7.350–7.450)
pO2, Arterial: 76.5 mmHg — ABNORMAL LOW (ref 80.0–100.0)

## 2012-11-10 MED ORDER — ALBUTEROL SULFATE (5 MG/ML) 0.5% IN NEBU
2.5000 mg | INHALATION_SOLUTION | Freq: Once | RESPIRATORY_TRACT | Status: AC
  Administered 2012-11-10: 2.5 mg via RESPIRATORY_TRACT

## 2012-11-12 NOTE — Procedures (Signed)
NAME:  Victoria Vasquez, Victoria Vasquez                ACCOUNT NO.:  [id number]  MEDICAL RECORD NO.:  [id number]  LOCATION:  RESP                          FACILITY:  [facility]  PHYSICIAN:  Yuepheng Schaller L. Juanetta Gosling, M.D.DATE OF BIRTH:  24-Aug-1927  DATE OF PROCEDURE: DATE OF DISCHARGE:  11/10/2012                           PULMONARY FUNCTION TEST   REASON FOR PULMONARY FUNCTION TESTING:  Shortness of breath.  RESULTS: 1. Spirometry shows a mild ventilatory defect with minimal if any     airflow obstruction. 2. Lung volumes show normal total lung capacity and some air trapping. 3. DLCO is normal. 4. Airway resistance is minimally elevated. 5. Arterial blood gas is normal. 6. There is no definite cause of shortness of breath seen on this     pulmonary function test.     Ramon Dredge L. Juanetta Gosling, M.D.     ELH/MEDQ  D:  11/11/2012  T:  11/12/2012  Job:  [id number]

## 2012-11-23 ENCOUNTER — Encounter: Payer: Self-pay | Admitting: Cardiology

## 2012-12-01 ENCOUNTER — Ambulatory Visit: Payer: Medicare Other | Admitting: Family Medicine

## 2012-12-06 ENCOUNTER — Encounter: Payer: Self-pay | Admitting: Family Medicine

## 2012-12-06 ENCOUNTER — Ambulatory Visit (INDEPENDENT_AMBULATORY_CARE_PROVIDER_SITE_OTHER): Payer: Self-pay | Admitting: Family Medicine

## 2012-12-06 VITALS — BP 130/70 | Wt 172.4 lb

## 2012-12-06 DIAGNOSIS — E119 Type 2 diabetes mellitus without complications: Secondary | ICD-10-CM

## 2012-12-06 DIAGNOSIS — I10 Essential (primary) hypertension: Secondary | ICD-10-CM

## 2012-12-06 LAB — POCT GLYCOSYLATED HEMOGLOBIN (HGB A1C): Hemoglobin A1C: 6

## 2012-12-06 NOTE — Patient Instructions (Signed)
Increased walking three times a week  Stop glipizide  Follow up  At end of Oct.  Call if questions  Continue metformin as is 1/2 twice a day

## 2012-12-06 NOTE — Progress Notes (Signed)
  Subjective:    Patient ID: Victoria Vasquez, female    DOB: January 02, 1928, 79 y.o.   MRN: [id number]  HPI patient arrives with complaint of continued weakness. This patient's had a fair amount of complaints or weakness she states she finds herself feeling run down tired she has had numerous testing done her main complaint comes with lack of stamina when she does activity shortly after starting she stops car she feels fatigued she had a thorough workup with her heart and everything seemed to be okay. She wanted to go overall the results of these tests. Past medical history include hypertension reflux diabetes She denies any low sugar spells. Does not smoke.  Review of Systems See above. No chest pain shortness of breath no nausea vomiting or diarrhea    Objective:   Physical Exam Blood pressure stable lungs are clear heart is regular extremities no edema foot exam is normal She does have bunions.      Assessment & Plan:  Diabetes-A1c looks great. Reduce medication as per orders. Followup in 3-4 months. HTN stable Weakness-I. encourage her to do extra walking 3 days a week to try to help her stamina. I don't recommend further testing currently Patient was encouraged to try to lose some weight

## 2012-12-15 ENCOUNTER — Telehealth: Payer: Self-pay | Admitting: Family Medicine

## 2012-12-15 MED ORDER — METFORMIN HCL 500 MG PO TABS: 250.0000 mg | ORAL_TABLET | Freq: Two times a day (BID) | ORAL | Status: DC

## 2012-12-15 NOTE — Telephone Encounter (Signed)
Patient needs Rx for metformin to [facility]

## 2012-12-15 NOTE — Telephone Encounter (Signed)
Med sent electronically to [facility].  Patient notified.

## 2013-01-13 ENCOUNTER — Other Ambulatory Visit: Payer: Self-pay | Admitting: Family Medicine

## 2013-03-07 ENCOUNTER — Ambulatory Visit (INDEPENDENT_AMBULATORY_CARE_PROVIDER_SITE_OTHER): Payer: MEDICARE | Admitting: Family Medicine

## 2013-03-07 DIAGNOSIS — E119 Type 2 diabetes mellitus without complications: Secondary | ICD-10-CM

## 2013-03-07 DIAGNOSIS — Z23 Encounter for immunization: Secondary | ICD-10-CM

## 2013-03-07 NOTE — Progress Notes (Signed)
  Subjective:    Patient ID: Victoria Vasquez, female    DOB: Jun 19, 1927, 79 y.o.   MRN: [id number]  HPI Patient is here today for a check up. The patient was seen today as part of a comprehensive diabetic check up. The patient had the following elements completed: -Review of medication compliance -Review of glucose monitoring results -Review of any complications do to high or low sugars -Diabetic foot exam was completed as part of today's visit. The following was also discussed: -Importance of yearly eye exams -Importance of following diabetic/low sugar-starch diet -Importance of exercise and regular activity -Importance of regular followup visits. -Most recent hemoglobin A1c were reviewed with the patient along with goals regarding diabetes.  She states blood sugars have been high. It was 211 this morning.   Blood sugars have been running high since having a tooth infection but otherwise doing fairly well  Review of Systems She denies excessive thirst blurred vision she denies chest pain she does relate weakness and feeling tired run down. She is not able to be excessively active.    Objective:   Physical Exam Lungs are clear no crackles heart is regular pulse normal abdomen soft diabetic foot exam down without difficulty does have some deformity changes. Pulses are good does have diminished sensation neck no masses       Assessment & Plan:  It is one day early but we will check hemoglobin A1c because patient is worried about her glucose levels so the we will check her A1c is mildly elevated. We will reinitiate medication. Flu shot given today Also recommend followup in approximately 4 months Lab work ordered and Patient with mild COPD only notices symptoms when she tries to do excessive or fast walking Patient also with macular degeneration she states she's having a more difficult time seeing. I have advised her that it is not safe for her to drive. If her eye doctor tells her  that she can drive then that would be up to him area via device to her was to have someone else do her driving.

## 2013-03-07 NOTE — Patient Instructions (Signed)
Metformin 500 mg , one 2 times a day  Glipizide 5 mg - take 1/2 tablet each am  If you don't see improvement over the next few weeks call we may need to addjust more  Also follow up here in 3 months

## 2013-03-08 ENCOUNTER — Other Ambulatory Visit: Payer: Self-pay | Admitting: *Deleted

## 2013-03-08 MED ORDER — METFORMIN HCL 500 MG PO TABS: 250.0000 mg | ORAL_TABLET | Freq: Two times a day (BID) | ORAL | Status: DC

## 2013-03-08 MED ORDER — GLIPIZIDE 5 MG PO TABS: ORAL_TABLET | ORAL | Status: DC

## 2013-03-24 ENCOUNTER — Telehealth: Payer: Self-pay | Admitting: Family Medicine

## 2013-03-24 NOTE — Telephone Encounter (Signed)
Patient says that her diabetes medication was increased at last visit, but it causes her stomach pain and a lot of bowel movements. Please advise.

## 2013-03-25 NOTE — Telephone Encounter (Signed)
Left message to return call 

## 2013-03-25 NOTE — Telephone Encounter (Signed)
Pt calling to see what she needs to do, she says her stomach hurts, please advise asap

## 2013-03-25 NOTE — Telephone Encounter (Signed)
Metformin 500 one bid and glipizide half tab daily

## 2013-03-25 NOTE — Telephone Encounter (Signed)
Stop metformin. Stay on glipizide at same dose. Call us mid next wk with fasting numbers on reduced meds

## 2013-03-28 NOTE — Telephone Encounter (Signed)
Spoke with patient regarding: Stop metformin. Stay on glipizide at same dose. Call us mid next wk with fasting numbers on reduced meds. Patient verbalized understanding.

## 2013-03-30 ENCOUNTER — Telehealth: Payer: Self-pay | Admitting: Family Medicine

## 2013-03-30 NOTE — Telephone Encounter (Signed)
She should go to half of a glipizide in the morning and a half of the glipizide at supper. If she has any low sugar spells she needs to notify us and go back to the original dose. She should followup in early December for a recheck

## 2013-03-30 NOTE — Telephone Encounter (Signed)
Left message to return call 

## 2013-03-30 NOTE — Telephone Encounter (Signed)
Patient advised that She should go to half of a glipizide in the morning and a half of the glipizide at supper. If she has any low sugar spells she needs to notify us and go back to the original dose. She should followup in early December for a recheck. Patient verbalized understanding.

## 2013-03-30 NOTE — Telephone Encounter (Signed)
Pt reading for Monday 169, Tuesday 182, Wednesday 170   With no metformin on board, only 1/2 glipizide in AM

## 2013-05-05 ENCOUNTER — Encounter: Payer: Self-pay | Admitting: Family Medicine

## 2013-05-05 ENCOUNTER — Ambulatory Visit (INDEPENDENT_AMBULATORY_CARE_PROVIDER_SITE_OTHER): Payer: Medicare Other | Admitting: Family Medicine

## 2013-05-05 VITALS — BP 122/82 | Ht 64.0 in | Wt 176.0 lb

## 2013-05-05 DIAGNOSIS — E119 Type 2 diabetes mellitus without complications: Secondary | ICD-10-CM

## 2013-05-05 DIAGNOSIS — I10 Essential (primary) hypertension: Secondary | ICD-10-CM

## 2013-05-05 NOTE — Progress Notes (Signed)
   Subjective:    Patient ID: Victoria Vasquez, female    DOB: 08/03/1927, 79 y.o.   MRN: [id number]  HPI Patient is here today for a f/u. She was told to stop her Metformin.   Her blood sugar this morning was 135.   Her A1C on 10/20 was 8.1 She will need A1c again in a couple months time we will follow her up again in approximately one to 2 months   Review of Systems She denies excessive thirst urination chest tightness pressure pain she does relate difficulty in seeing    Objective:   Physical Exam Lungs are clear no crackles heart is regular neck no masses extremities no edema       Assessment & Plan:  #1 diabetes subpar control she will check her sugar readings a little more often and send [place] those or call him to [place] in a few weeks we may need to adjust up on her medicine. May need to add additional medicine such as Invokana  I told the patient again that she really should not be driving that someone else should do the driving for her.  Followup in a couple months Metabolic 7 ordered await results Patient would benefit from glucometer that has voice capabilities. We will try to look into where she would get this.

## 2013-05-06 ENCOUNTER — Other Ambulatory Visit: Payer: Self-pay | Admitting: Family Medicine

## 2013-05-06 LAB — BASIC METABOLIC PANEL
Calcium: 8.7 mg/dL (ref 8.4–10.5)
Glucose, Bld: 139 mg/dL — ABNORMAL HIGH (ref 70–99)
Potassium: 4.5 mEq/L (ref 3.5–5.3)
Sodium: 140 mEq/L (ref 135–145)

## 2013-05-08 ENCOUNTER — Encounter: Payer: Self-pay | Admitting: Family Medicine

## 2013-05-19 DIAGNOSIS — H548 Legal blindness, as defined in USA: Secondary | ICD-10-CM

## 2013-05-19 DIAGNOSIS — H3530 Unspecified macular degeneration: Secondary | ICD-10-CM

## 2013-05-19 HISTORY — DX: Unspecified macular degeneration: H35.30

## 2013-05-19 HISTORY — DX: Legal blindness, as defined in USA: H54.8

## 2013-05-31 ENCOUNTER — Telehealth: Payer: Self-pay | Admitting: Cardiology

## 2013-05-31 MED ORDER — VERAPAMIL HCL ER 240 MG PO TBCR: 240.0000 mg | EXTENDED_RELEASE_TABLET | Freq: Every day | ORAL | Status: DC

## 2013-05-31 MED ORDER — LISINOPRIL 20 MG PO TABS: 20.0000 mg | ORAL_TABLET | Freq: Every day | ORAL | Status: DC

## 2013-05-31 NOTE — Telephone Encounter (Signed)
rx refilled.

## 2013-05-31 NOTE — Telephone Encounter (Signed)
Received fax refill request  Rx #  Medication:  Verapamil ER Tab 240 mg / 12 HR Qty 90 Sig:  Take one by mouth at bedtime Physician:  Lattie Haw   Received fax refill request  Rx #  Medication:  Lisinopril Tab 20 mg Qty 90 Sig:  Take one by mouth daily Physician:  Lattie Haw

## 2013-06-09 ENCOUNTER — Ambulatory Visit: Payer: MEDICARE | Admitting: Family Medicine

## 2013-06-15 ENCOUNTER — Telehealth: Payer: Self-pay | Admitting: Family Medicine

## 2013-06-15 NOTE — Telephone Encounter (Signed)
I called patient, gave her phone # to [facility] so she can call to order her a glucometer like you asked me to.  She stated that she dropped off blood sugar readings and you usually call her with a comment or recommendation and she's not heard from you, readings were sent to [facility] to be sent to be scanned, please see on front of paper chart, pt would like a call reguarding the readings

## 2013-06-15 NOTE — Telephone Encounter (Signed)
Please tell the patient that her blood sugar readings were reviewed by myself. I am concerned that her evening numbers are higher than what they need to be. I would recommend taking glipizide 5 mg half a tablet in the morning and a half a tablet again right at supper time. Have her continue to monitor her readings. Followup in the spring. Send [place] readings in a few weeks.

## 2013-06-16 ENCOUNTER — Other Ambulatory Visit: Payer: Self-pay

## 2013-06-16 NOTE — Telephone Encounter (Signed)
Discussed with patient. She states she is already taking glipizide 5mg  1/2 BID. Her med list shows 1/2 qam. She stated it was changed at her last visit. Do you want to increase.

## 2013-06-16 NOTE — Telephone Encounter (Signed)
Notified patient go to one in the am and 1/2 in pm. Recheck A1C in 3 months. Patient verbalized understanding.

## 2013-06-16 NOTE — Telephone Encounter (Signed)
Go to one in the am and 1/2 in pm , PLEASE update med list! Thanks, recheck A1C in 3 months

## 2013-08-09 ENCOUNTER — Encounter: Payer: Self-pay | Admitting: Family Medicine

## 2013-08-09 ENCOUNTER — Ambulatory Visit (INDEPENDENT_AMBULATORY_CARE_PROVIDER_SITE_OTHER): Payer: Medicare Other | Admitting: Family Medicine

## 2013-08-09 VITALS — BP 122/80 | Ht 64.0 in | Wt 180.0 lb

## 2013-08-09 DIAGNOSIS — R5383 Other fatigue: Secondary | ICD-10-CM

## 2013-08-09 DIAGNOSIS — E785 Hyperlipidemia, unspecified: Secondary | ICD-10-CM

## 2013-08-09 DIAGNOSIS — R5381 Other malaise: Secondary | ICD-10-CM

## 2013-08-09 DIAGNOSIS — E119 Type 2 diabetes mellitus without complications: Secondary | ICD-10-CM

## 2013-08-09 LAB — POCT GLYCOSYLATED HEMOGLOBIN (HGB A1C): Hemoglobin A1C: 7.1

## 2013-08-09 NOTE — Progress Notes (Signed)
   Subjective:    Patient ID: Victoria Vasquez, female    DOB: January 25, 1928, 80 y.o.   MRN: [id number]  Diabetes She presents for her follow-up diabetic visit. She has type 2 diabetes mellitus. Current diabetic treatment includes oral agent (dual therapy). She is compliant with treatment all of the time.   Patient does do good job watching her diet she's been doing well with taking her medicine tolerating it well. She denies any passing out spells nausea vomiting chest pain.   Review of Systems Denies excessive thirst or urination denies dizziness nausea vomiting. She does have some fatigue but this been present for years    Objective:   Physical Exam  Lungs are clear hearts regular kyphosis noted pulse normal BP good foot exam normal      Assessment & Plan:  Diabetes-improvement. Continue current measures. Will not add other medicines at this time. Watch diet stay physically active as best as possible. Lab work ordered comprehensive for diabetes hyperlipidemia renal insufficiency. Followup in July at the latest

## 2013-08-17 LAB — CBC WITH DIFFERENTIAL/PLATELET
Basophils Absolute: 0 10*3/uL (ref 0.0–0.1)
Basophils Relative: 0 % (ref 0–1)
EOS ABS: 0.5 10*3/uL (ref 0.0–0.7)
EOS PCT: 8 % — AB (ref 0–5)
HEMATOCRIT: 40.7 % (ref 36.0–46.0)
Hemoglobin: 13.6 g/dL (ref 12.0–15.0)
LYMPHS ABS: 2.3 10*3/uL (ref 0.7–4.0)
Lymphocytes Relative: 34 % (ref 12–46)
MCH: 29.3 pg (ref 26.0–34.0)
MCHC: 33.4 g/dL (ref 30.0–36.0)
MCV: 87.7 fL (ref 78.0–100.0)
MONO ABS: 0.9 10*3/uL (ref 0.1–1.0)
Monocytes Relative: 13 % — ABNORMAL HIGH (ref 3–12)
Neutro Abs: 3.1 10*3/uL (ref 1.7–7.7)
Neutrophils Relative %: 45 % (ref 43–77)
PLATELETS: 195 10*3/uL (ref 150–400)
RBC: 4.64 MIL/uL (ref 3.87–5.11)
RDW: 14.4 % (ref 11.5–15.5)
WBC: 6.8 10*3/uL (ref 4.0–10.5)

## 2013-08-18 LAB — LIPID PANEL
Cholesterol: 261 mg/dL — ABNORMAL HIGH (ref 0–200)
HDL: 44 mg/dL (ref >39)
LDL Cholesterol: 175 mg/dL — ABNORMAL HIGH (ref 0–99)
Total CHOL/HDL Ratio: 5.9 Ratio
Triglycerides: 209 mg/dL — ABNORMAL HIGH (ref <150)
VLDL: 42 mg/dL — ABNORMAL HIGH (ref 0–40)

## 2013-08-18 LAB — BASIC METABOLIC PANEL
BUN: 16 mg/dL (ref 6–23)
CO2: 22 mEq/L (ref 19–32)
Calcium: 8.8 mg/dL (ref 8.4–10.5)
Chloride: 108 mEq/L (ref 96–112)
Creat: 1.05 mg/dL (ref 0.50–1.10)
Glucose, Bld: 134 mg/dL — ABNORMAL HIGH (ref 70–99)
Potassium: 4.2 mEq/L (ref 3.5–5.3)
Sodium: 143 mEq/L (ref 135–145)

## 2013-08-18 LAB — HEPATIC FUNCTION PANEL
ALT: 11 U/L (ref 0–35)
AST: 15 U/L (ref 0–37)
Albumin: 4.2 g/dL (ref 3.5–5.2)
Alkaline Phosphatase: 79 U/L (ref 39–117)
BILIRUBIN DIRECT: 0.1 mg/dL (ref 0.0–0.3)
BILIRUBIN TOTAL: 0.5 mg/dL (ref 0.2–1.2)
Indirect Bilirubin: 0.4 mg/dL (ref 0.2–1.2)
Total Protein: 6.6 g/dL (ref 6.0–8.3)

## 2013-08-18 LAB — MICROALBUMIN, URINE: Microalb, Ur: 6.99 mg/dL — ABNORMAL HIGH (ref 0.00–1.89)

## 2013-08-30 MED ORDER — ATORVASTATIN CALCIUM 10 MG PO TABS: 10.0000 mg | ORAL_TABLET | Freq: Every day | ORAL | Status: DC

## 2013-08-30 NOTE — Addendum Note (Signed)
Addended by: Dairl Ponder on: 08/30/2013 10:57 AM   Modules accepted: Orders

## 2013-10-07 IMAGING — CR DG CHEST 2V
2 series · 2 of 2 positions shown · non-contrast
Comparison: None.

CLINICAL DATA: Dyspnea

CHEST - 2 VIEW

[view not recorded (1 of 2)]
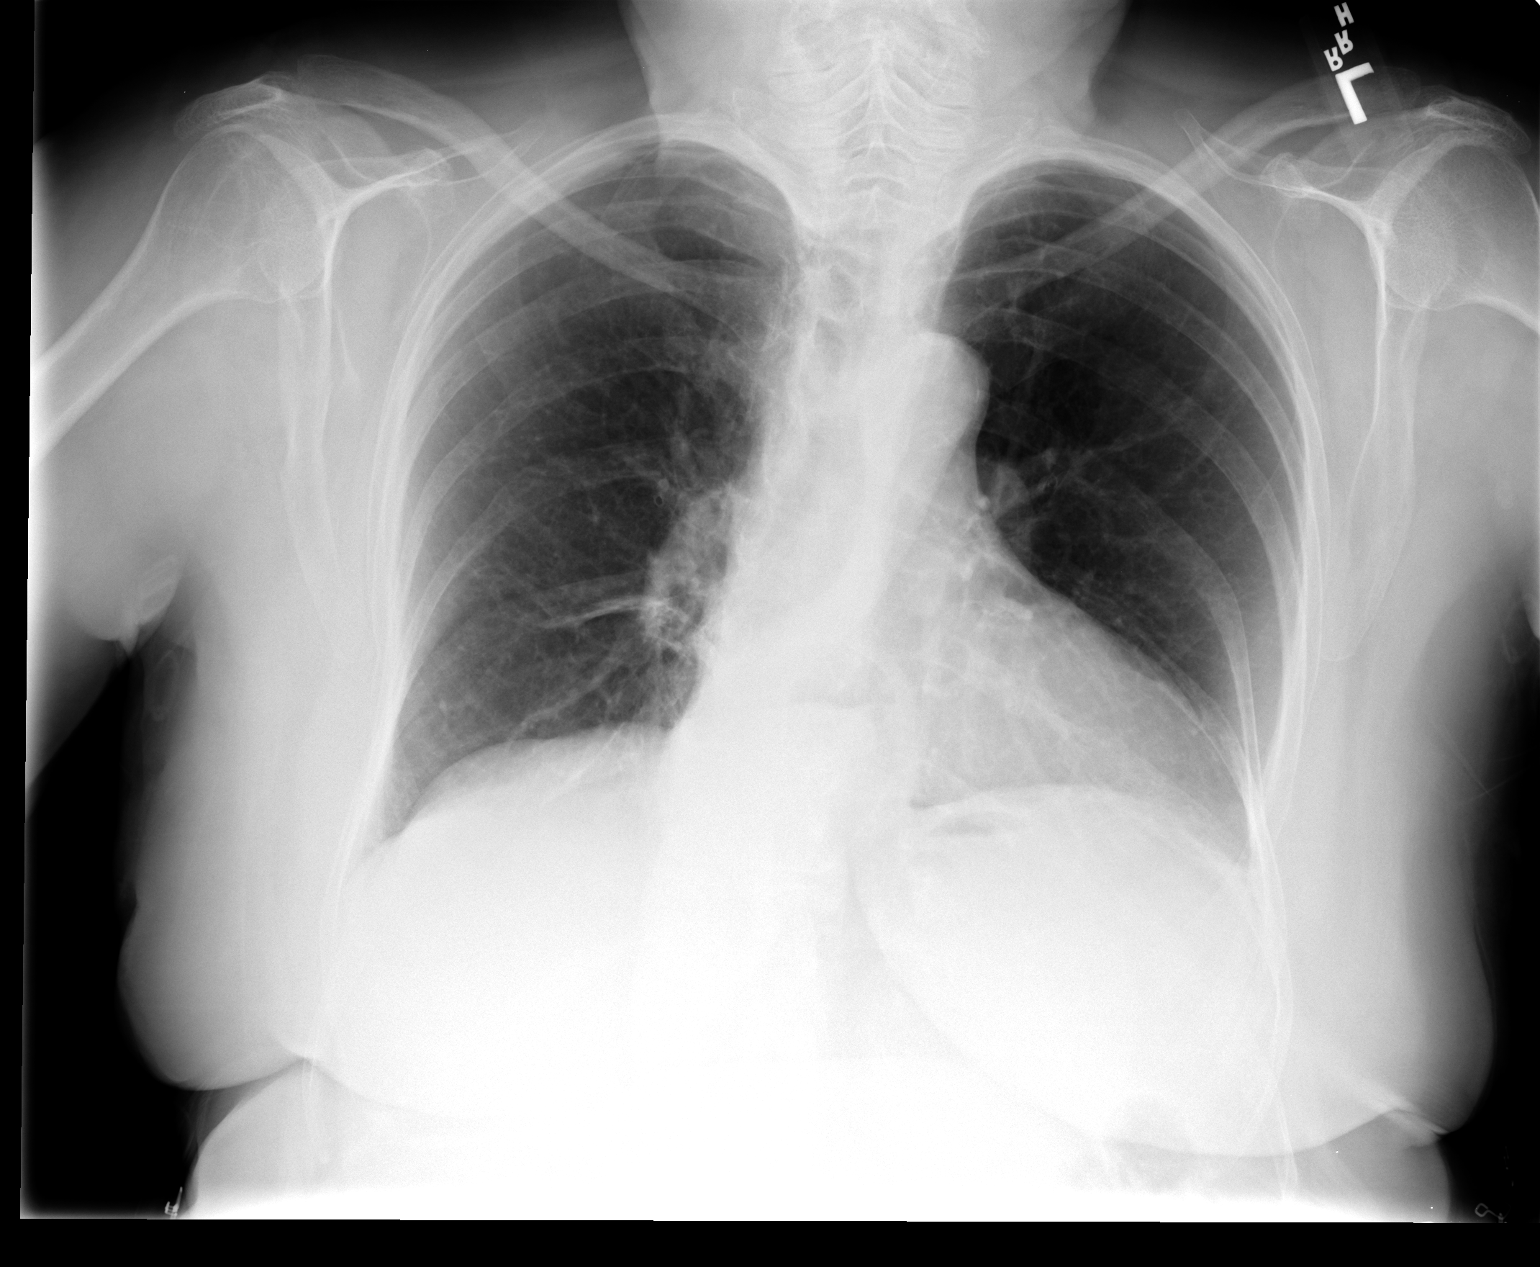

[view not recorded (2 of 2)]
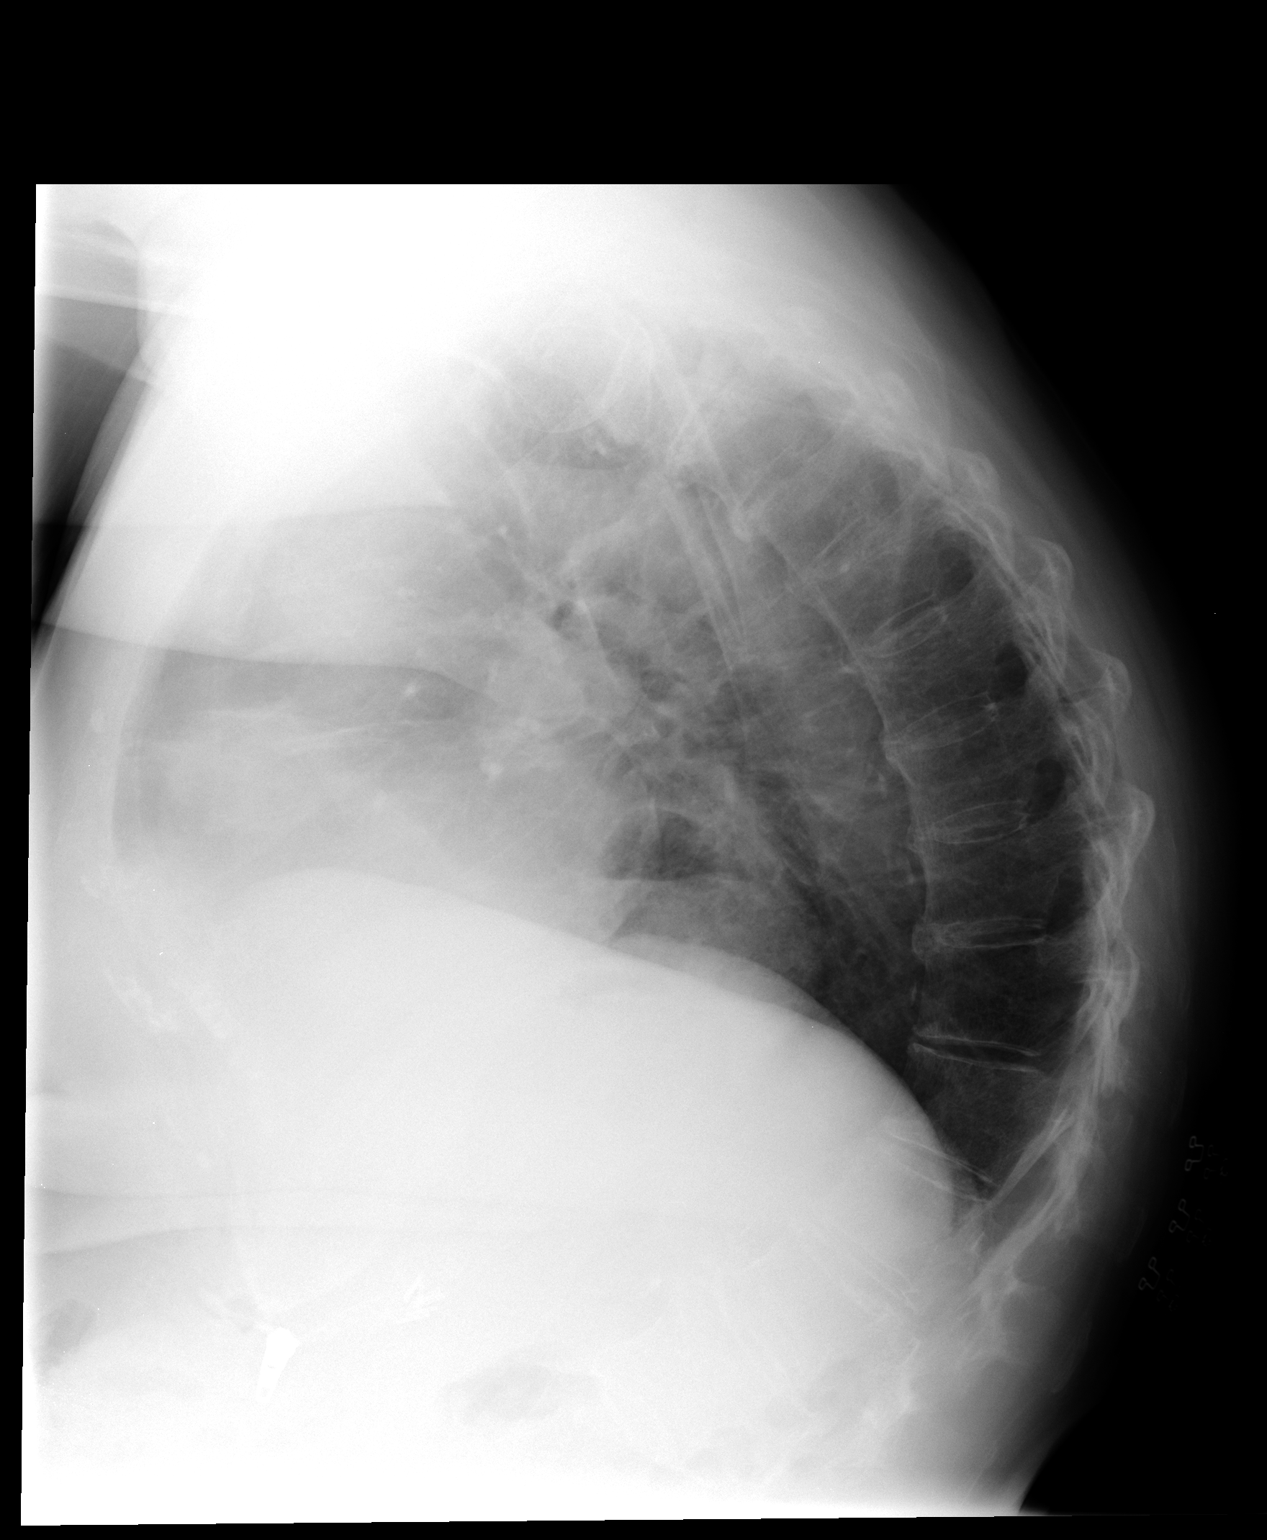

[2 of 2 positions shown; findings below may reference images not displayed]

FINDINGS: Normal mediastinum and heart silhouette.  Moderate size
hiatal hernia noted.  No effusion, infiltrate, or pneumothorax.
There is kyphosis of the thoracic spine.
IMPRESSION: 1..  No acute cardiopulmonary findings.

2.  Hiatal hernia.
3.  Kyphosis.

## 2013-10-19 ENCOUNTER — Telehealth: Payer: Self-pay | Admitting: Family Medicine

## 2013-10-19 MED ORDER — GLIPIZIDE 5 MG PO TABS: ORAL_TABLET | ORAL | Status: DC

## 2013-10-19 NOTE — Telephone Encounter (Signed)
pts glipiZIDE (GLUCOTROL) 5 MG tablet  Has run out, been having issues with getting this ordered Can we send her in at least 8-10 days worth to [facility] use till they can get hers mailed to her?   Her sugars have been running really high without this med for the past week

## 2013-10-19 NOTE — Telephone Encounter (Signed)
Medication sent to [facility] and Mail order. Left message notifying patient on voicemail.

## 2013-10-25 ENCOUNTER — Other Ambulatory Visit: Payer: Self-pay | Admitting: *Deleted

## 2013-10-25 MED ORDER — OMEPRAZOLE 20 MG PO CPDR: 20.0000 mg | DELAYED_RELEASE_CAPSULE | Freq: Every day | ORAL | Status: DC

## 2013-10-29 IMAGING — NM NM MYOCAR SINGLE W/SPECT W/WALL MOTION & EF
2 series · 12 of 12 positions shown · non-contrast
Comparison: none

nm myoview pharmacologic stress

Ordering Physician: TALISKA SOHIER
Mouny Physician: [REDACTED]al Data: 84-year-old woman with multiple cardiovascular risk
factors and dyspnea on exertion.
NUCLEAR MEDICINE ADENOSINE STRESS MYOVIEW STUDY WITH SPECT AND LEFT
VENTRIUCLAR EJECTION FRACTION
Radionuclide Data: One-day rest/stress protocol performed with
[DATE] mCi of 5c-77m Myoview.
Stress Data: Regadenoson infusion proceeded without incident.
There was a moderate increase in heart rate but no change in
systolic blood pressure with drug administration.  No arrhythmias
noted.
EKG: Normal sinus rhythm; delayed R-wave progression; decreased
voltage; rightward axis; inferior T-wave inversion.  No significant
change with pharmacologic stress.
Scintigraphic Data: Acquisition notable for imaging with the
patient's arms at her side.  The left ventricular size was normal.
On tomographic images reconstructed in standard planes, there was a
small and mild defect in the basilar and mid inferolateral segment
with no reversibility.  The gated reconstruction demonstrated
normal regional and global LV systolic function as well as normal
systolic accentuation of activity throughout.  Estimated ejection
fraction was 66%.

[Series 1: cs cardiac tc hi dose · 6.41mm/px · 6 of 512 frames shown]
[frame 43/512]
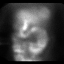
[frame 128/512]
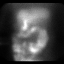
[frame 214/512]
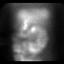
[frame 299/512]
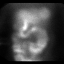
[frame 384/512]
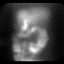
[frame 470/512]
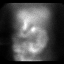

[Series 1: cr cardiac tc low dose · 6.41mm/px · 6 of 64 frames shown]
[frame 6/64]
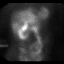
[frame 16/64]
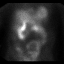
[frame 27/64]
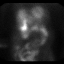
[frame 38/64]
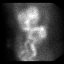
[frame 48/64]
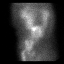
[frame 59/64]
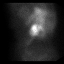

[12 of 12 positions shown; findings below may reference images not displayed]

IMPRESSION: Negative pharmacologic stress nuclear myocardial study revealing
normal left ventricular size and function, a small area of probable
soft tissue attenuation without convincing evidence for ischemia or
infarction.  Other findings as noted.

## 2013-12-20 ENCOUNTER — Ambulatory Visit (INDEPENDENT_AMBULATORY_CARE_PROVIDER_SITE_OTHER): Payer: Medicare Other | Admitting: Family Medicine

## 2013-12-20 ENCOUNTER — Ambulatory Visit (HOSPITAL_COMMUNITY)
Admission: RE | Admit: 2013-12-20 | Discharge: 2013-12-20 | Disposition: A | Discharge status: Home / Self Care | Payer: Medicare Other | Source: Ambulatory Visit | Attending: Family Medicine | Admitting: Family Medicine

## 2013-12-20 ENCOUNTER — Encounter: Payer: Self-pay | Admitting: Family Medicine

## 2013-12-20 VITALS — BP 110/72 | Ht 64.0 in | Wt 179.0 lb

## 2013-12-20 DIAGNOSIS — E119 Type 2 diabetes mellitus without complications: Secondary | ICD-10-CM

## 2013-12-20 DIAGNOSIS — M79672 Pain in left foot: Secondary | ICD-10-CM

## 2013-12-20 DIAGNOSIS — I951 Orthostatic hypotension: Secondary | ICD-10-CM

## 2013-12-20 DIAGNOSIS — I10 Essential (primary) hypertension: Secondary | ICD-10-CM

## 2013-12-20 DIAGNOSIS — M79609 Pain in unspecified limb: Secondary | ICD-10-CM

## 2013-12-20 DIAGNOSIS — M7989 Other specified soft tissue disorders: Secondary | ICD-10-CM | POA: Insufficient documentation

## 2013-12-20 LAB — POCT GLYCOSYLATED HEMOGLOBIN (HGB A1C): Hemoglobin A1C: 7.2

## 2013-12-20 MED ORDER — VERAPAMIL HCL ER 120 MG PO TBCR: 120.0000 mg | EXTENDED_RELEASE_TABLET | Freq: Every day | ORAL | Status: DC

## 2013-12-20 NOTE — Progress Notes (Signed)
   Subjective:    Patient ID: Victoria Vasquez, female    DOB: Apr 26, 1928, 80 y.o.   MRN: [id number]  Diabetes She presents for her follow-up diabetic visit. She has type 2 diabetes mellitus. Her disease course has been stable. There are no hypoglycemic associated symptoms. There are no diabetic associated symptoms. There are no hypoglycemic complications. Symptoms are stable. There are no diabetic complications. There are no known risk factors for coronary artery disease. When asked about current treatments, none were reported. She is compliant with treatment all of the time.  A1C is 7.2.  Patient states she is still experiencing weakness. This is worse when she stands up and when she tries to walk she feels exact of all of her energy   Left foot swelling is noted also. This has been present over 2 weeks now. No triggers she states she has pain discomfort when she walks around pain is in the ankle and the mid forefoot  Denies blood in stool Went over all of her medicines she does not use Symbicort currently only intermittently because of the cost   Review of Systems     Objective:   Physical Exam Diabetic foot exam completed Lungs are clear hearts regular Extremities no edema Left foot tenderness and pain Neurologic grossly normal       Assessment & Plan:  Diabetes-stable. No need to change medication. Watch diet closely. Stay physically active  Macular degeneration legally blind has family drive her around  Orthostatic hypotension reduce verapamil to 120 mg if patient not doing better within the next several weeks followup for recheck otherwise followup 3 months  Left foot pain probable traumatic arthritis from previous injury will do x-ray if persistent problems may need bone scan

## 2014-01-04 ENCOUNTER — Telehealth: Payer: Self-pay | Admitting: Cardiovascular Disease

## 2014-01-04 MED ORDER — LISINOPRIL 20 MG PO TABS: 20.0000 mg | ORAL_TABLET | Freq: Every day | ORAL | Status: DC

## 2014-01-04 NOTE — Telephone Encounter (Signed)
Please see refill bin / tgs  °

## 2014-01-19 ENCOUNTER — Other Ambulatory Visit: Payer: Self-pay | Admitting: *Deleted

## 2014-01-19 MED ORDER — LISINOPRIL 20 MG PO TABS: 20.0000 mg | ORAL_TABLET | Freq: Every day | ORAL | Status: DC

## 2014-01-25 ENCOUNTER — Other Ambulatory Visit: Payer: Self-pay | Admitting: *Deleted

## 2014-01-25 MED ORDER — ATORVASTATIN CALCIUM 10 MG PO TABS: 10.0000 mg | ORAL_TABLET | Freq: Every day | ORAL | Status: DC

## 2014-02-20 ENCOUNTER — Telehealth: Payer: Self-pay | Admitting: Family Medicine

## 2014-02-20 NOTE — Telephone Encounter (Signed)
Please make sure that pt is taking script for diabetes as prescribed

## 2014-02-20 NOTE — Telephone Encounter (Signed)
Patient states she is taking her Glipizide as prescribed- one tab in the morning and a half tab in evening.

## 2014-02-20 NOTE — Telephone Encounter (Signed)
Call pt make sure she is taking diabetic meds as prescibed, if haviung any low spells let us know

## 2014-02-21 ENCOUNTER — Telehealth: Payer: Self-pay | Admitting: Family Medicine

## 2014-02-21 NOTE — Telephone Encounter (Signed)
Rx faxed to pharmacy  

## 2014-02-21 NOTE — Telephone Encounter (Signed)
Patient needs Rx for diabetic test strips to [facility].

## 2014-02-28 LAB — HM DIABETES EYE EXAM

## 2014-03-03 ENCOUNTER — Ambulatory Visit: Payer: Medicare Other | Admitting: Nurse Practitioner

## 2014-03-13 ENCOUNTER — Encounter: Payer: Self-pay | Admitting: Family Medicine

## 2014-03-27 ENCOUNTER — Encounter: Payer: Self-pay | Admitting: Family Medicine

## 2014-03-27 ENCOUNTER — Ambulatory Visit (INDEPENDENT_AMBULATORY_CARE_PROVIDER_SITE_OTHER): Payer: Medicare Other | Admitting: Family Medicine

## 2014-03-27 VITALS — BP 124/80 | Temp 97.8°F | Ht 64.0 in | Wt 182.2 lb

## 2014-03-27 DIAGNOSIS — R0609 Other forms of dyspnea: Secondary | ICD-10-CM

## 2014-03-27 DIAGNOSIS — R059 Cough, unspecified: Secondary | ICD-10-CM

## 2014-03-27 DIAGNOSIS — Z23 Encounter for immunization: Secondary | ICD-10-CM

## 2014-03-27 DIAGNOSIS — R05 Cough: Secondary | ICD-10-CM

## 2014-03-27 MED ORDER — LOSARTAN POTASSIUM 100 MG PO TABS: 100.0000 mg | ORAL_TABLET | Freq: Every day | ORAL | Status: DC

## 2014-03-27 MED ORDER — BUDESONIDE-FORMOTEROL FUMARATE 160-4.5 MCG/ACT IN AERO: INHALATION_SPRAY | RESPIRATORY_TRACT | Status: DC

## 2014-03-27 NOTE — Progress Notes (Signed)
   Subjective:    Patient ID: Victoria Vasquez, female    DOB: [birth year], 80 y.o.   MRN: [id number]  Cough This is a new problem. The current episode started more than 1 month ago. The problem has been unchanged. The cough is non-productive. Associated symptoms include shortness of breath and wheezing. Nothing aggravates the symptoms. She has tried OTC cough suppressant for the symptoms. The treatment provided no relief.  this been going on for several weeks denies any weight loss denies any loss of appetite Patient states that she has no other concerns at this time.  She denies excessive sweats fever chills denies coughing up blood denies chest pain  Review of Systems  Constitutional: Negative for activity change, appetite change and fatigue.  Respiratory: Positive for cough, shortness of breath and wheezing.   Endocrine: Negative for polydipsia and polyphagia.  Genitourinary: Negative for frequency.  Neurological: Negative for weakness.  Psychiatric/Behavioral: Negative for confusion.       Objective:   Physical Exam  Constitutional: She appears well-nourished. No distress.  Cardiovascular: Normal rate, regular rhythm and normal heart sounds.   No murmur heard. Pulmonary/Chest: Effort normal and breath sounds normal. No respiratory distress.  Musculoskeletal: She exhibits no edema.  Lymphadenopathy:    She has no cervical adenopathy.  Neurological: She is alert. She exhibits normal muscle tone.  Psychiatric: Her behavior is normal.  Vitals reviewed.         Assessment & Plan:  cough-although this could be related to lisinopril it could also be related to COPD I do believe this patient should get a chest x-ray she needs to resume her Symbicort use and we will switch from lisinopril to losartan we will follow her up again in several weeks' time

## 2014-03-28 ENCOUNTER — Other Ambulatory Visit: Payer: Self-pay | Admitting: *Deleted

## 2014-03-28 ENCOUNTER — Ambulatory Visit (HOSPITAL_COMMUNITY)
Admission: RE | Admit: 2014-03-28 | Discharge: 2014-03-28 | Disposition: A | Discharge status: Home / Self Care | Payer: Medicare Other | Source: Ambulatory Visit | Attending: Family Medicine | Admitting: Family Medicine

## 2014-03-28 DIAGNOSIS — R0609 Other forms of dyspnea: Secondary | ICD-10-CM | POA: Insufficient documentation

## 2014-03-28 MED ORDER — LOSARTAN POTASSIUM 100 MG PO TABS: 100.0000 mg | ORAL_TABLET | Freq: Every day | ORAL | Status: DC

## 2014-03-28 NOTE — Progress Notes (Signed)
Patient notified and verbalized understanding of the test results. No further questions. 

## 2014-05-01 ENCOUNTER — Ambulatory Visit (INDEPENDENT_AMBULATORY_CARE_PROVIDER_SITE_OTHER): Payer: Medicare Other | Admitting: Family Medicine

## 2014-05-01 ENCOUNTER — Encounter: Payer: Self-pay | Admitting: Family Medicine

## 2014-05-01 VITALS — BP 136/90 | Temp 98.3°F | Ht 64.0 in | Wt 183.0 lb

## 2014-05-01 DIAGNOSIS — J301 Allergic rhinitis due to pollen: Secondary | ICD-10-CM

## 2014-05-01 DIAGNOSIS — R05 Cough: Secondary | ICD-10-CM

## 2014-05-01 DIAGNOSIS — K219 Gastro-esophageal reflux disease without esophagitis: Secondary | ICD-10-CM

## 2014-05-01 DIAGNOSIS — R059 Cough, unspecified: Secondary | ICD-10-CM | POA: Insufficient documentation

## 2014-05-01 MED ORDER — AZITHROMYCIN 250 MG PO TABS: ORAL_TABLET | ORAL | Status: DC

## 2014-05-01 MED ORDER — FLUTICASONE PROPIONATE 50 MCG/ACT NA SUSP: 2.0000 | Freq: Every day | NASAL | Status: DC

## 2014-05-01 MED ORDER — ESOMEPRAZOLE MAGNESIUM 40 MG PO CPDR: 40.0000 mg | DELAYED_RELEASE_CAPSULE | Freq: Every day | ORAL | Status: DC

## 2014-05-01 NOTE — Progress Notes (Signed)
   Subjective:    Patient ID: Victoria Vasquez, female    DOB: Feb 21, 1928, 80 y.o.   MRN: [id number]  Cough This is a recurrent problem. Episode onset: Since September. The problem has been waxing and waning. The problem occurs every few minutes. The cough is non-productive. Associated symptoms include a sore throat. Associated symptoms comments: Acid reflux. The symptoms are aggravated by lying down. Treatments tried: inhaler and was told to quit taking Lisinopril. The treatment provided mild relief. Her past medical history is significant for asthma and COPD.    She's had this cough going on for multiple weeks. Chest x-ray was negative. She does relate postnasal drip. She also relates regurgitation of reflux does not feel omeprazole doing a good job cover her. She denies any high fevers.  Review of Systems  HENT: Positive for sore throat.   Respiratory: Positive for cough.   denies chest pressure tightness pain     Objective:   Physical Exam Frequent dry cough noted while patient was sitting with [place] not respiratory distress lungs are clear heart regular pulse normal extremities no edema sinus nontender       Assessment & Plan:  Cough is noted. This is multi factorial. I believe there is some component of allergic rhinitis along with reflux she also has reactive airway. She is to continue the inhaler. We will change to Nexium. She will also is Flonase. Recheck here in a few weeks. Also 1 round of antibiotics. If not significantly improved by the time she follows up then referral to pulmonology.

## 2014-05-08 ENCOUNTER — Other Ambulatory Visit: Payer: Self-pay | Admitting: Family Medicine

## 2014-05-17 ENCOUNTER — Ambulatory Visit: Payer: Medicare Other | Admitting: Family Medicine

## 2014-05-18 ENCOUNTER — Encounter: Payer: Self-pay | Admitting: Family Medicine

## 2014-05-18 ENCOUNTER — Ambulatory Visit (INDEPENDENT_AMBULATORY_CARE_PROVIDER_SITE_OTHER): Payer: Medicare Other | Admitting: Family Medicine

## 2014-05-18 VITALS — BP 128/80 | Temp 98.1°F | Ht 63.0 in | Wt 181.0 lb

## 2014-05-18 DIAGNOSIS — E785 Hyperlipidemia, unspecified: Secondary | ICD-10-CM

## 2014-05-18 DIAGNOSIS — E119 Type 2 diabetes mellitus without complications: Secondary | ICD-10-CM

## 2014-05-18 DIAGNOSIS — Z23 Encounter for immunization: Secondary | ICD-10-CM

## 2014-05-18 DIAGNOSIS — M8580 Other specified disorders of bone density and structure, unspecified site: Secondary | ICD-10-CM

## 2014-05-18 DIAGNOSIS — R05 Cough: Secondary | ICD-10-CM

## 2014-05-18 DIAGNOSIS — R059 Cough, unspecified: Secondary | ICD-10-CM

## 2014-05-18 LAB — POCT GLYCOSYLATED HEMOGLOBIN (HGB A1C): Hemoglobin A1C: 8.9

## 2014-05-18 MED ORDER — PANTOPRAZOLE SODIUM 40 MG PO TBEC: 40.0000 mg | DELAYED_RELEASE_TABLET | Freq: Every day | ORAL | Status: DC

## 2014-05-18 NOTE — Progress Notes (Signed)
   Subjective:    Patient ID: Victoria Vasquez, female    DOB: Dec 25, 1927, 80 y.o.   MRN: [id number]  Hypertension This is a chronic problem. The current episode started more than 1 year ago. Pertinent negatives include no chest pain or shortness of breath.  Follow up on BP. Was taking lisinopril and it was changed to losartan because of cough.  No other concerns today.  Pt states nexium cost $733 for 3 months. Her part was over $100. Pt would like to know if it can be changed.  Patient is due to have her A1c checked Patient states her coughing is doing much better She states her breathing is doing good She denies any chest tightness pressure pain shortness of breath or high fevers 25 minutes spent with patient covering multiple issues greater than half time spent discussing these issues and answering patient's questions  Review of Systems  Constitutional: Positive for fatigue. Negative for fever, activity change and appetite change.  HENT: Negative for congestion.   Respiratory: Negative for cough and shortness of breath.   Cardiovascular: Negative for chest pain.  Gastrointestinal: Negative for abdominal pain.  Endocrine: Negative for polydipsia and polyphagia.  Genitourinary: Negative for frequency.  Neurological: Negative for weakness.  Psychiatric/Behavioral: Negative for confusion.       Objective:   Physical Exam  Constitutional: She appears well-nourished. No distress.  Cardiovascular: Normal rate, regular rhythm and normal heart sounds.   No murmur heard. Pulmonary/Chest: Effort normal and breath sounds normal. No respiratory distress.  Musculoskeletal: She exhibits no edema.  Lymphadenopathy:    She has no cervical adenopathy.  Neurological: She is alert. She exhibits normal muscle tone.  Psychiatric: Her behavior is normal.  Vitals reviewed.         Assessment & Plan:  #1 HTN-use verapamil 120 mg one per day #2 osteopenia history needs bone density #3 Prevnar  13 given today #4 diabetes better control needed. Patient states she can do much better on diet. She is to do that.continue current measures watch diet closely, she will send [place] some sugar readings in the next few weeks May need to add additional medicine #5 prescribed Protonix hopefully this is cheaper then nexium #6 follow-up in 3 months check A1c at that time

## 2014-05-31 ENCOUNTER — Telehealth: Payer: Self-pay | Admitting: Family Medicine

## 2014-05-31 NOTE — Telephone Encounter (Signed)
See chart for sugar readings  All if any questions  [phone]

## 2014-06-01 NOTE — Telephone Encounter (Signed)
I reviewed her readings I rec to increase her dose of glipizide to 5 mg qam and qpm (60, 5 refills- may do 90 day if pt desires) send [place] some readings in a few weeks

## 2014-06-02 MED ORDER — GLIPIZIDE 5 MG PO TABS: ORAL_TABLET | ORAL | Status: DC

## 2014-06-02 NOTE — Telephone Encounter (Signed)
Discussed with patient. Patient advised the Dr Nicki Reaper reviewed her readings and rec to increase her dose of glipizide to 5 mg qam and qpm (60, 5 refills- may do 90 day if pt desires) send [place] some readings in a few weeks.90 day Rx sent in electronically to pharmacy. Patient verbalized understanding.

## 2014-06-02 NOTE — Addendum Note (Signed)
Addended by: Dairl Ponder on: 06/02/2014 08:59 AM   Modules accepted: Orders

## 2014-06-13 ENCOUNTER — Other Ambulatory Visit (HOSPITAL_COMMUNITY): Payer: Medicare Other

## 2014-07-03 ENCOUNTER — Telehealth: Payer: Self-pay | Admitting: Family Medicine

## 2014-07-03 NOTE — Telephone Encounter (Signed)
I received the patient's readings regarding her blood sugars. Morning blood sugars are in the 120s to 150 range with an occasional at 175 evening readings 129 summer at 170/190 some her it to 50. We may have to go up on her medications but at this present moment I do not recommend adding or increasing her medicine. I would like for this patient to be healthy with her diet. Try to minimize starches and sugars as best as possible. Nurses, advised patient to stay on current dose watch diet closely follow-up in the spring (mid March through April) for office visit and A1c

## 2014-07-04 NOTE — Telephone Encounter (Signed)
Discussed with patient. Pt has office visit scheduled in April.

## 2014-08-04 ENCOUNTER — Other Ambulatory Visit: Payer: Self-pay | Admitting: Family Medicine

## 2014-08-07 ENCOUNTER — Telehealth: Payer: Self-pay | Admitting: Family Medicine

## 2014-08-07 MED ORDER — GLIPIZIDE 5 MG PO TABS: ORAL_TABLET | ORAL | Status: DC

## 2014-08-07 NOTE — Telephone Encounter (Signed)
Medication sent to pharmacy. Patient was notified.  

## 2014-08-07 NOTE — Telephone Encounter (Signed)
Pt is needing a refill on her glipazide pt has an appt for 08/30/14.  [employer]

## 2014-08-15 ENCOUNTER — Other Ambulatory Visit: Payer: Self-pay | Admitting: Cardiology

## 2014-08-19 ENCOUNTER — Other Ambulatory Visit: Payer: Self-pay | Admitting: Cardiology

## 2014-08-21 ENCOUNTER — Other Ambulatory Visit: Payer: Self-pay | Admitting: Family Medicine

## 2014-08-25 ENCOUNTER — Other Ambulatory Visit: Payer: Self-pay

## 2014-08-25 MED ORDER — VERAPAMIL HCL ER 120 MG PO TBCR: 120.0000 mg | EXTENDED_RELEASE_TABLET | Freq: Every day | ORAL | Status: DC

## 2014-08-30 ENCOUNTER — Encounter: Payer: Self-pay | Admitting: Family Medicine

## 2014-08-30 ENCOUNTER — Ambulatory Visit (INDEPENDENT_AMBULATORY_CARE_PROVIDER_SITE_OTHER): Payer: PPO | Admitting: Family Medicine

## 2014-08-30 VITALS — BP 130/86 | Ht 63.0 in | Wt 184.0 lb

## 2014-08-30 DIAGNOSIS — E785 Hyperlipidemia, unspecified: Secondary | ICD-10-CM | POA: Diagnosis not present

## 2014-08-30 DIAGNOSIS — E119 Type 2 diabetes mellitus without complications: Secondary | ICD-10-CM

## 2014-08-30 DIAGNOSIS — I10 Essential (primary) hypertension: Secondary | ICD-10-CM | POA: Diagnosis not present

## 2014-08-30 DIAGNOSIS — H3530 Unspecified macular degeneration: Secondary | ICD-10-CM

## 2014-08-30 LAB — POCT GLYCOSYLATED HEMOGLOBIN (HGB A1C): Hemoglobin A1C: 7.8

## 2014-08-30 MED ORDER — ATORVASTATIN CALCIUM 10 MG PO TABS: ORAL_TABLET | ORAL | Status: DC

## 2014-08-30 MED ORDER — AMLODIPINE BESYLATE 5 MG PO TABS: 5.0000 mg | ORAL_TABLET | Freq: Every day | ORAL | Status: DC

## 2014-08-30 MED ORDER — LOSARTAN POTASSIUM 100 MG PO TABS: ORAL_TABLET | ORAL | Status: DC

## 2014-08-30 MED ORDER — PANTOPRAZOLE SODIUM 40 MG PO TBEC: 40.0000 mg | DELAYED_RELEASE_TABLET | Freq: Every day | ORAL | Status: DC

## 2014-08-30 MED ORDER — GLIPIZIDE 5 MG PO TABS: 5.0000 mg | ORAL_TABLET | Freq: Two times a day (BID) | ORAL | Status: DC

## 2014-08-30 NOTE — Progress Notes (Signed)
   Subjective:    Patient ID: Victoria Vasquez, female    DOB: 12-27-1927, 81 y.o.   MRN: [id number]  Diabetes She presents for her follow-up diabetic visit. She has type 2 diabetes mellitus. Pertinent negatives for hypoglycemia include no confusion. Pertinent negatives for diabetes include no chest pain, no fatigue, no polydipsia, no polyphagia and no weakness. Risk factors for coronary artery disease include diabetes mellitus, dyslipidemia, hypertension and obesity. Current diabetic treatment includes oral agent (monotherapy). She is compliant with treatment all of the time.  Hypertension This is a chronic problem. The current episode started more than 1 year ago. The problem has been gradually worsening since onset. The problem is uncontrolled. Pertinent negatives include no chest pain. Risk factors for coronary artery disease include diabetes mellitus and dyslipidemia. Past treatments include angiotensin blockers and calcium channel blockers. The current treatment provides moderate improvement. Compliance problems include diet.   Hyperlipidemia This is a chronic problem. The current episode started more than 1 year ago. The problem is controlled. Recent lipid tests were reviewed and are normal. Pertinent negatives include no chest pain. Current antihyperlipidemic treatment includes statins. The current treatment provides significant improvement of lipids. There are no compliance problems.       Review of Systems  Constitutional: Negative for activity change, appetite change and fatigue.  HENT: Negative for congestion.   Respiratory: Negative for cough.   Cardiovascular: Negative for chest pain.  Gastrointestinal: Negative for abdominal pain.  Endocrine: Negative for polydipsia and polyphagia.  Neurological: Negative for weakness.  Psychiatric/Behavioral: Negative for confusion.   On today's visit systolic blood pressure in the 160/94 range which is too high for her.    Objective:   Physical Exam  Constitutional: She appears well-nourished. No distress.  Cardiovascular: Normal rate, regular rhythm and normal heart sounds.   No murmur heard. Pulmonary/Chest: Effort normal and breath sounds normal. No respiratory distress.  Musculoskeletal: She exhibits no edema.  Lymphadenopathy:    She has no cervical adenopathy.  Neurological: She is alert. She exhibits normal muscle tone.  Psychiatric: Her behavior is normal.  Vitals reviewed.         Assessment & Plan:  1. Diabetes mellitus without complication Diabetes good control continue current measures. Improved compared where it was medications reordered - POCT glycosylated hemoglobin (Hb A1C) - Microalbumin, urine  2. Macular degeneration Follows with a nice specialist able to care for herself currently with family's help  3. Hyperlipidemia Continue current medication watch diet check lab work - Lipid panel - Hepatic function panel - Basic metabolic panel - Microalbumin, urine  4. Essential hypertension Add amlodipine. Stop verapamil. Order knee medicine. Continue other measures. Follow-up in a proximally 2-3 months to recheck blood pressure - Hepatic function panel - Basic metabolic panel - Microalbumin, urine

## 2014-09-02 LAB — HEPATIC FUNCTION PANEL
ALT: 15 IU/L (ref 0–32)
AST: 17 IU/L (ref 0–40)
Albumin: 4.4 g/dL (ref 3.5–4.7)
Alkaline Phosphatase: 115 IU/L (ref 39–117)
Bilirubin Total: 0.4 mg/dL (ref 0.0–1.2)
Bilirubin, Direct: 0.1 mg/dL (ref 0.00–0.40)
TOTAL PROTEIN: 6.9 g/dL (ref 6.0–8.5)

## 2014-09-02 LAB — BASIC METABOLIC PANEL
BUN/Creatinine Ratio: 12 (ref 11–26)
BUN: 13 mg/dL (ref 8–27)
CO2: 24 mmol/L (ref 18–29)
Calcium: 9 mg/dL (ref 8.7–10.3)
Chloride: 102 mmol/L (ref 97–108)
Creatinine, Ser: 1.06 mg/dL — ABNORMAL HIGH (ref 0.57–1.00)
GFR calc non Af Amer: 48 mL/min/{1.73_m2} — ABNORMAL LOW (ref >59)
GFR, EST AFRICAN AMERICAN: 56 mL/min/{1.73_m2} — AB (ref >59)
Glucose: 166 mg/dL — ABNORMAL HIGH (ref 65–99)
Potassium: 4.6 mmol/L (ref 3.5–5.2)
SODIUM: 142 mmol/L (ref 134–144)

## 2014-09-02 LAB — LIPID PANEL
Chol/HDL Ratio: 3.6 ratio units (ref 0.0–4.4)
Cholesterol, Total: 174 mg/dL (ref 100–199)
HDL: 48 mg/dL (ref >39)
LDL Calculated: 82 mg/dL (ref 0–99)
Triglycerides: 220 mg/dL — ABNORMAL HIGH (ref 0–149)
VLDL Cholesterol Cal: 44 mg/dL — ABNORMAL HIGH (ref 5–40)

## 2014-09-02 LAB — MICROALBUMIN, URINE: Microalbumin, Urine: 63.5 ug/mL — ABNORMAL HIGH (ref 0.0–17.0)

## 2014-09-04 ENCOUNTER — Encounter: Payer: Self-pay | Admitting: Family Medicine

## 2014-09-04 DIAGNOSIS — E1121 Type 2 diabetes mellitus with diabetic nephropathy: Secondary | ICD-10-CM | POA: Insufficient documentation

## 2014-10-26 ENCOUNTER — Encounter: Payer: Self-pay | Admitting: Family Medicine

## 2014-10-26 ENCOUNTER — Ambulatory Visit (INDEPENDENT_AMBULATORY_CARE_PROVIDER_SITE_OTHER): Payer: PPO | Admitting: Family Medicine

## 2014-10-26 ENCOUNTER — Telehealth: Payer: Self-pay | Admitting: Family Medicine

## 2014-10-26 VITALS — BP 110/80 | Temp 97.9°F | Ht 63.0 in | Wt 182.4 lb

## 2014-10-26 DIAGNOSIS — L0211 Cutaneous abscess of neck: Secondary | ICD-10-CM | POA: Diagnosis not present

## 2014-10-26 MED ORDER — DOXYCYCLINE HYCLATE 100 MG PO CAPS: 100.0000 mg | ORAL_CAPSULE | Freq: Two times a day (BID) | ORAL | Status: DC

## 2014-10-26 NOTE — Telephone Encounter (Signed)
rec ov here first then this can be referred if need be

## 2014-10-26 NOTE — Telephone Encounter (Signed)
Patient scheduled office visit to discuss

## 2014-10-26 NOTE — Progress Notes (Signed)
   Subjective:    Patient ID: Victoria Vasquez, female    DOB: 04/02/1928, 80 y.o.   MRN: [id number]  HPI Patient is here today for a knot on the left side of her neck. Area is sore, red and itchy. This has been present for about 5 years now. Treatments tried: none.  Patient states that she is had a cyst there for several years over the past couple days is become warm tender draining some and significantly enlarged. She denies fevers chills or sweats Patient has no other concerns at this time.  Review of Systems     Objective:   Physical Exam Lungs are clear hearts regular has abscess left side of neck  The location of this is suctioned where it would be best for subspecialist to do the incision and drainage     Assessment & Plan:  Patient is to see [facility] on Friday, Dr. Redmond Baseman, show up at 2:40 PM. Patient aware. She will do warm compresses 20 minutes every 3 hours, doxycycline twice a day next 10 days.

## 2014-10-26 NOTE — Telephone Encounter (Signed)
Pt has a growth on her neck that has been growing and is starting to bother her. Pt wants to know if she should come to [place] or see someone else about it.

## 2014-11-27 ENCOUNTER — Other Ambulatory Visit: Payer: Self-pay | Admitting: Family Medicine

## 2014-11-28 ENCOUNTER — Ambulatory Visit (INDEPENDENT_AMBULATORY_CARE_PROVIDER_SITE_OTHER): Payer: PPO | Admitting: Family Medicine

## 2014-11-28 ENCOUNTER — Encounter: Payer: Self-pay | Admitting: Family Medicine

## 2014-11-28 VITALS — BP 136/80 | Ht 63.0 in | Wt 182.0 lb

## 2014-11-28 DIAGNOSIS — H3530 Unspecified macular degeneration: Secondary | ICD-10-CM | POA: Diagnosis not present

## 2014-11-28 DIAGNOSIS — I10 Essential (primary) hypertension: Secondary | ICD-10-CM

## 2014-11-28 DIAGNOSIS — K219 Gastro-esophageal reflux disease without esophagitis: Secondary | ICD-10-CM

## 2014-11-28 DIAGNOSIS — E785 Hyperlipidemia, unspecified: Secondary | ICD-10-CM

## 2014-11-28 DIAGNOSIS — E119 Type 2 diabetes mellitus without complications: Secondary | ICD-10-CM

## 2014-11-28 LAB — POCT GLYCOSYLATED HEMOGLOBIN (HGB A1C): Hemoglobin A1C: 7.6

## 2014-11-28 NOTE — Progress Notes (Signed)
   Subjective:    Patient ID: Victoria Vasquez, female    DOB: Nov 02, 1927, 81 y.o.   MRN: [id number]  Diabetes She presents for her follow-up diabetic visit. She has type 2 diabetes mellitus. Risk factors for coronary artery disease include diabetes mellitus, dyslipidemia, hypertension, obesity, post-menopausal and sedentary lifestyle. Current diabetic treatment includes oral agent (monotherapy). She is compliant with treatment all of the time. Her weight is stable. She is following a diabetic diet. She has not had a previous visit with a dietitian. She does not see a podiatrist.Eye exam current: due visit in sept.   She states her sugars run anywhere between 130s and 170s she ran it's hard for her to get an accurate reading because of her macular degeneration her daughter does help her with her health  She denies back pain. Denies any falls denies depression Review of Systems Occasionally has shortness of breath but the albuterol helps she also uses Symbicort twice a day she denies chest pressure pain denies hemoptysis denies fever chills sweats    Objective:   Physical Exam Lungs clear heart regular pulse normal kyphosis noted extremities no edema blood pressure good       Assessment & Plan:  1. Diabetes mellitus without complication S5K is higher than what I would like to see. She will record her sugar readings and send it back to [place] for review at a couple weeks May need adjustments on medications - POCT glycosylated hemoglobin (Hb A1C)  2. Essential hypertension Blood pressure under good control continue current measures  3. Gastroesophageal reflux disease without esophagitis Reflux controlled with medicine continue current measures  4. Hyperlipidemia Recent lab work looked good no need to repeat currently continue current measures watch diet  5. Macular degeneration Progressively worse. The patient will have her daughter call her insurance company to find out if they would help  her get a glucometer with voice capabilities so she would know her readings easier. She will let us know what she finds out and whether or not we need to do any other type of interventions  Finally this patient would benefit from bone density test it's been 3 years since previous 1

## 2014-11-29 ENCOUNTER — Ambulatory Visit: Payer: PPO | Admitting: Family Medicine

## 2014-12-01 ENCOUNTER — Telehealth: Payer: Self-pay

## 2014-12-01 DIAGNOSIS — M810 Age-related osteoporosis without current pathological fracture: Secondary | ICD-10-CM

## 2014-12-01 NOTE — Telephone Encounter (Signed)
-----   Message from Kathyrn Drown, MD sent at 11/28/2014 11:18 AM EDT ----- Patient needs bone density testing for osteoporosis screening please help set up

## 2014-12-01 NOTE — Telephone Encounter (Signed)
[facility] (order in the system)

## 2014-12-04 NOTE — Telephone Encounter (Signed)
Spoke with patient and informed her per Dr.Scott - a bone density testing for osteoporosis screening is needed. Patient verbalized understanding. Informed patient that we would schedule appointment and inform her of the date and time. Patient  Stated that a Tuesday morning or Friday morning would be the best times for her to go. Called [facility] and tried to schedule an appointment 4 times with no answer. Will call again to try to schedule appointment.

## 2014-12-05 NOTE — Telephone Encounter (Signed)
Called patient to inform her that her appointment for her Bone Density Scan at [facility] is set up for Tuesday 12/11/13 at 8:45am. Instructed patient to take a list of medication and not to take calcium supplements 24-48 hrs before appointment. Patient verbalized understanding.

## 2014-12-12 ENCOUNTER — Encounter: Payer: Self-pay | Admitting: Family Medicine

## 2014-12-12 ENCOUNTER — Ambulatory Visit (HOSPITAL_COMMUNITY)
Admission: RE | Admit: 2014-12-12 | Discharge: 2014-12-12 | Disposition: A | Discharge status: Home / Self Care | Payer: PPO | Source: Ambulatory Visit | Attending: Family Medicine | Admitting: Family Medicine

## 2014-12-12 DIAGNOSIS — Z780 Asymptomatic menopausal state: Secondary | ICD-10-CM | POA: Insufficient documentation

## 2014-12-12 DIAGNOSIS — R29890 Loss of height: Secondary | ICD-10-CM | POA: Diagnosis not present

## 2014-12-12 DIAGNOSIS — M8580 Other specified disorders of bone density and structure, unspecified site: Secondary | ICD-10-CM | POA: Diagnosis not present

## 2014-12-18 ENCOUNTER — Telehealth: Payer: Self-pay | Admitting: Family Medicine

## 2014-12-18 NOTE — Telephone Encounter (Signed)
Pt dropped off her sugar readings for the dr. Eston Esters in basket.

## 2014-12-20 NOTE — Telephone Encounter (Signed)
[facility]

## 2014-12-20 NOTE — Telephone Encounter (Signed)
I would recommend that the patient adjust her medication. Glipizide use 1-1/2 tablet in the morning and use 1 tablet at supper if having any low sugar spells (below 80) to notify us. Keep regular follow-ups. If patient would like to send [place] more readings in approximately 2 weeks to review that would be fine

## 2014-12-20 NOTE — Telephone Encounter (Signed)
Notified patient recommend that the patient adjust her medication. Glipizide use 1-1/2 tablet in the morning and use 1 tablet at supper if having any low sugar spells (below 80) to notify us. Keep regular follow-ups. If patient would like to send [place] more readings in approximately 2 weeks to review that would be fine. Patient verbalized understanding.

## 2015-01-16 ENCOUNTER — Other Ambulatory Visit: Payer: Self-pay | Admitting: Family Medicine

## 2015-02-05 ENCOUNTER — Other Ambulatory Visit: Payer: Self-pay | Admitting: Family Medicine

## 2015-02-15 ENCOUNTER — Telehealth: Payer: Self-pay | Admitting: Family Medicine

## 2015-02-15 NOTE — Telephone Encounter (Signed)
Pt dropped off readings for her glucose meter, see folder at your corner

## 2015-02-18 NOTE — Telephone Encounter (Signed)
Nurse's-please tell the patient reviewed over her sugar readings. Her glucose is at suppertime or higher than what I would like. Please asked the patient-verify that she is taking 1-1/2 tablet in the morning and 1 at suppertime. If she is in fact taking it that way then instruct her to to do 2 tablets in the morning and 1 at suppertime. If she has not been taking it that way please let me know. I would recommend that she monitor her sugar as she has been in send [place] more readings in 2 weeks. She has an appointment at the end of October that she should keep. Be certain to change her prescription in Epic so that it reflects how she is currently taking it .

## 2015-02-19 ENCOUNTER — Other Ambulatory Visit: Payer: Self-pay | Admitting: *Deleted

## 2015-02-19 MED ORDER — GLIPIZIDE 5 MG PO TABS: ORAL_TABLET | ORAL | Status: DC

## 2015-02-19 NOTE — Telephone Encounter (Signed)
Discussed with pt. She is taking one and a half qam and one at supper. She will increase to 2 qam and one at supper. Send readings in two weeks. Med list updated and new script sent to [place] [facility]. Pt verbalized understanding.

## 2015-02-26 ENCOUNTER — Other Ambulatory Visit: Payer: Self-pay | Admitting: Family Medicine

## 2015-03-07 LAB — HM DIABETES EYE EXAM

## 2015-03-13 ENCOUNTER — Ambulatory Visit: Payer: PPO | Admitting: Family Medicine

## 2015-03-20 ENCOUNTER — Encounter: Payer: Self-pay | Admitting: *Deleted

## 2015-03-21 ENCOUNTER — Telehealth: Payer: Self-pay | Admitting: Family Medicine

## 2015-03-21 NOTE — Telephone Encounter (Signed)
Pt dropped off her sugar readings. Message in box.

## 2015-03-22 NOTE — Telephone Encounter (Signed)
The patient's glucoses show fasting sugars moderately elevated 141-173 and evening glucoses 184-maximum 250. These readings are too high. I recommend patient to be seen in November. Check hemoglobin A1c when she comes. Bring all medications with her to that visit.

## 2015-03-23 NOTE — Telephone Encounter (Signed)
Discussed with pt. Pt transferred to front to schedule ov in November.

## 2015-03-27 ENCOUNTER — Encounter: Payer: Self-pay | Admitting: Family Medicine

## 2015-03-27 ENCOUNTER — Ambulatory Visit (INDEPENDENT_AMBULATORY_CARE_PROVIDER_SITE_OTHER): Payer: PPO | Admitting: Family Medicine

## 2015-03-27 VITALS — BP 110/74 | Ht 63.0 in | Wt 184.0 lb

## 2015-03-27 DIAGNOSIS — E119 Type 2 diabetes mellitus without complications: Secondary | ICD-10-CM | POA: Diagnosis not present

## 2015-03-27 DIAGNOSIS — Z23 Encounter for immunization: Secondary | ICD-10-CM | POA: Diagnosis not present

## 2015-03-27 LAB — POCT GLYCOSYLATED HEMOGLOBIN (HGB A1C): Hemoglobin A1C: 8.7

## 2015-03-27 MED ORDER — GLIPIZIDE 5 MG PO TABS: ORAL_TABLET | ORAL | Status: DC

## 2015-03-27 NOTE — Progress Notes (Signed)
   Subjective:    Patient ID: Victoria Vasquez, female    DOB: 1927-08-18, 81 y.o.   MRN: [id number]  Diabetes She presents for her follow-up diabetic visit. She has type 2 diabetes mellitus. Pertinent negatives for hypoglycemia include no confusion. Pertinent negatives for diabetes include no chest pain, no fatigue, no polydipsia, no polyphagia and no weakness. She is compliant with treatment all of the time. She is following a diabetic diet. She never participates in exercise. Her breakfast blood glucose range is generally >200 mg/dl. She sees a podiatrist.Eye exam is current.  A1C today 8.7  patient states she tries to eat healthy but she has to eat whatever she can fix easily because of her age Dry Cough off and on for 1 - 2 weeks.  Flu vaccine today.  patient states cough comes at this time year typically uses steroid inhaler and it helps Review of Systems  Constitutional: Negative for activity change, appetite change and fatigue.  HENT: Negative for congestion.   Respiratory: Negative for cough.   Cardiovascular: Negative for chest pain.  Gastrointestinal: Negative for abdominal pain.  Endocrine: Negative for polydipsia and polyphagia.  Neurological: Negative for weakness.  Psychiatric/Behavioral: Negative for confusion.       Objective:   Physical Exam  lungs clear heart regular extremities no edema blood pressure good diabetic foot exam patient does have some changes see documentation documentation       Assessment & Plan:   patient does not one to be on insulin or other expensive medicine she wants to try increasing glipizide 2 tablets in the morning 2 in the evening she also desires to send [place] some readings in a few weeks time possibly will go on medications if no response with this.   patient seen retina specialist. They are to send [place] what they find. She is having some difficulty with vision in her eye doctor said there something going on with her retina.   I believe the  pulmonary issue is related to change in seasons her lungs sound clear if she starts having mucoid drainage or increased coughing or other problems we will need further workup including x-rays possible other medications

## 2015-05-29 ENCOUNTER — Other Ambulatory Visit: Payer: Self-pay | Admitting: Family Medicine

## 2015-05-29 ENCOUNTER — Other Ambulatory Visit: Payer: Self-pay | Admitting: *Deleted

## 2015-05-29 MED ORDER — PANTOPRAZOLE SODIUM 40 MG PO TBEC: 40.0000 mg | DELAYED_RELEASE_TABLET | Freq: Every day | ORAL | Status: DC

## 2015-06-18 ENCOUNTER — Telehealth: Payer: Self-pay | Admitting: Family Medicine

## 2015-06-18 DIAGNOSIS — E785 Hyperlipidemia, unspecified: Secondary | ICD-10-CM

## 2015-06-18 DIAGNOSIS — E119 Type 2 diabetes mellitus without complications: Secondary | ICD-10-CM

## 2015-06-18 DIAGNOSIS — I10 Essential (primary) hypertension: Secondary | ICD-10-CM

## 2015-06-18 DIAGNOSIS — Z79899 Other long term (current) drug therapy: Secondary | ICD-10-CM

## 2015-06-18 NOTE — Telephone Encounter (Signed)
Patient has appointment on 2/13 for diabetic checkup and she would like labs done before hand.

## 2015-06-18 NOTE — Telephone Encounter (Signed)
Lipid, liver, metabolic 7, hemoglobin A1c, urine ACR 

## 2015-06-18 NOTE — Telephone Encounter (Signed)
Orders ready. Pt notified.  

## 2015-06-18 NOTE — Telephone Encounter (Signed)
bw orders. Pt notified.

## 2015-06-22 DIAGNOSIS — I10 Essential (primary) hypertension: Secondary | ICD-10-CM | POA: Diagnosis not present

## 2015-06-22 DIAGNOSIS — E785 Hyperlipidemia, unspecified: Secondary | ICD-10-CM | POA: Diagnosis not present

## 2015-06-22 DIAGNOSIS — Z79899 Other long term (current) drug therapy: Secondary | ICD-10-CM | POA: Diagnosis not present

## 2015-06-22 DIAGNOSIS — E119 Type 2 diabetes mellitus without complications: Secondary | ICD-10-CM | POA: Diagnosis not present

## 2015-06-23 LAB — MICROALBUMIN / CREATININE URINE RATIO
CREATININE, UR: 145.1 mg/dL
MICROALB/CREAT RATIO: 57 mg/g creat — ABNORMAL HIGH (ref 0.0–30.0)
MICROALBUM., U, RANDOM: 82.7 ug/mL

## 2015-06-23 LAB — HEPATIC FUNCTION PANEL
ALBUMIN: 4.7 g/dL (ref 3.5–4.7)
ALT: 19 IU/L (ref 0–32)
AST: 21 IU/L (ref 0–40)
Alkaline Phosphatase: 98 IU/L (ref 39–117)
BILIRUBIN, DIRECT: 0.12 mg/dL (ref 0.00–0.40)
Bilirubin Total: 0.5 mg/dL (ref 0.0–1.2)
TOTAL PROTEIN: 7 g/dL (ref 6.0–8.5)

## 2015-06-23 LAB — LIPID PANEL
CHOL/HDL RATIO: 3.5 ratio (ref 0.0–4.4)
Cholesterol, Total: 189 mg/dL (ref 100–199)
HDL: 54 mg/dL (ref >39)
LDL Calculated: 99 mg/dL (ref 0–99)
Triglycerides: 182 mg/dL — ABNORMAL HIGH (ref 0–149)
VLDL Cholesterol Cal: 36 mg/dL (ref 5–40)

## 2015-06-23 LAB — BASIC METABOLIC PANEL
BUN / CREAT RATIO: 15 (ref 11–26)
BUN: 17 mg/dL (ref 8–27)
CO2: 25 mmol/L (ref 18–29)
CREATININE: 1.16 mg/dL — AB (ref 0.57–1.00)
Calcium: 9.2 mg/dL (ref 8.7–10.3)
Chloride: 103 mmol/L (ref 96–106)
GFR calc Af Amer: 50 mL/min/{1.73_m2} — ABNORMAL LOW (ref >59)
GFR calc non Af Amer: 43 mL/min/{1.73_m2} — ABNORMAL LOW (ref >59)
GLUCOSE: 163 mg/dL — AB (ref 65–99)
Potassium: 4.6 mmol/L (ref 3.5–5.2)
Sodium: 143 mmol/L (ref 134–144)

## 2015-06-23 LAB — HEMOGLOBIN A1C
ESTIMATED AVERAGE GLUCOSE: 226 mg/dL
HEMOGLOBIN A1C: 9.5 % — AB (ref 4.8–5.6)

## 2015-06-25 ENCOUNTER — Other Ambulatory Visit: Payer: Self-pay | Admitting: Family Medicine

## 2015-06-29 DIAGNOSIS — H40052 Ocular hypertension, left eye: Secondary | ICD-10-CM | POA: Diagnosis not present

## 2015-06-29 DIAGNOSIS — H40051 Ocular hypertension, right eye: Secondary | ICD-10-CM | POA: Diagnosis not present

## 2015-07-02 ENCOUNTER — Ambulatory Visit: Payer: PPO | Admitting: Family Medicine

## 2015-07-05 ENCOUNTER — Encounter: Payer: Self-pay | Admitting: Family Medicine

## 2015-07-05 ENCOUNTER — Ambulatory Visit (INDEPENDENT_AMBULATORY_CARE_PROVIDER_SITE_OTHER): Payer: PPO | Admitting: Family Medicine

## 2015-07-05 VITALS — BP 136/84 | Ht 63.0 in | Wt 183.0 lb

## 2015-07-05 DIAGNOSIS — E118 Type 2 diabetes mellitus with unspecified complications: Secondary | ICD-10-CM | POA: Diagnosis not present

## 2015-07-05 DIAGNOSIS — E1121 Type 2 diabetes mellitus with diabetic nephropathy: Secondary | ICD-10-CM

## 2015-07-05 DIAGNOSIS — I10 Essential (primary) hypertension: Secondary | ICD-10-CM

## 2015-07-05 DIAGNOSIS — H409 Unspecified glaucoma: Secondary | ICD-10-CM | POA: Diagnosis not present

## 2015-07-05 DIAGNOSIS — H3530 Unspecified macular degeneration: Secondary | ICD-10-CM

## 2015-07-05 MED ORDER — METFORMIN HCL 500 MG PO TABS: 500.0000 mg | ORAL_TABLET | Freq: Two times a day (BID) | ORAL | Status: DC

## 2015-07-05 MED ORDER — LOSARTAN POTASSIUM 50 MG PO TABS: 50.0000 mg | ORAL_TABLET | Freq: Every day | ORAL | Status: DC

## 2015-07-05 NOTE — Progress Notes (Signed)
   Subjective:    Patient ID: Victoria Vasquez, female    DOB: 12/13/1927, 82 y.o.   MRN: [id number]  Diabetes She presents for her follow-up diabetic visit. She has type 2 diabetes mellitus. No MedicAlert identification noted. She has not had a previous visit with a dietitian. She does not see a podiatrist.Eye exam is current.   Patient states has concerns of blood sugar.   her sugars run high both in the morning and later in the day. She states that she does not eat healthy partly because of limitations she faces with her vision Recent labs drawn on 06/22/15   we went over the lab work what's most concerning is his he elevation of the A1c  Review of Systems  she denies any sweats chills nausea vomiting chest pressure pain discomfort    Objective:   Physical Exam  lungs are clear hearts regular pulse normal BP good extremities no edema skin warm dry diabetic foot exam normal  25 minutes was spent with the patient. Greater than half the time was spent in discussion and answering questions and counseling regarding the issues that the patient came in for today.      Assessment & Plan:   diabetic education  start metformin I encouraged her to consider insulin but she does not want to go on insulin currently we will recheck A1c in 3 months  Subpar control  when the patient follows up in 3 months if A1c not significantly improved then start insulin  May benefit from seeing endocrinology as well  hyperlipidemia good control  Orthostatic hypotension reduce Cozaar -from 100 mg - new dose 50 mg  significant vision issues currently she is getting along at home but this could change

## 2015-07-12 ENCOUNTER — Encounter: Payer: Self-pay | Admitting: Family Medicine

## 2015-07-19 ENCOUNTER — Telehealth: Payer: Self-pay | Admitting: Family Medicine

## 2015-07-19 DIAGNOSIS — E119 Type 2 diabetes mellitus without complications: Secondary | ICD-10-CM

## 2015-07-19 NOTE — Telephone Encounter (Signed)
metFORMIN (GLUCOPHAGE) 500 MG tablet   Pt states she just started this med it is causing her to have abd pain and diarrhea   Please advise

## 2015-07-20 ENCOUNTER — Telehealth: Payer: Self-pay | Admitting: Family Medicine

## 2015-07-20 MED ORDER — GLUCOSE BLOOD VI STRP: ORAL_STRIP | Status: DC

## 2015-07-20 NOTE — Telephone Encounter (Signed)
Notified patient unfortunately there are no other low-cost alternatives to this medication. Next alternatives would be long-acting insulin but I believe the patient would benefit from input with a endocrinologist-diabetes specialists. Dr. Nicki Reaper believes this patient would benefit from a consultation with the endocrinologist here in town (Dr. Dorris Fetch). Referral put in system. Patient verbalized understanding and agreed.

## 2015-07-20 NOTE — Telephone Encounter (Signed)
Script resent to pharmacy. Notified patient that insurance will only pay for once a day testing. Patient verbalized understanding.

## 2015-07-20 NOTE — Telephone Encounter (Signed)
ONE TOUCH ULTRA TEST test strip   [place] [facility]   Pt states she has been testing twice a day as per your request She has ran out and needs more but the pharmacy stated it was too  Early. Can we correct this and send in a new script

## 2015-07-20 NOTE — Telephone Encounter (Signed)
Unfortunately there are no other low-cost alternatives to this medication. Next alternatives would be long-acting insulin but I believe the patient would benefit from input with a endocrinologist-diabetes specialists. I believe this patient would benefit from a consultation with the endocrinologist here in town. (When we had talked with the patient about the possibility of insulin there was challenges and the patient did not want to start insulin )Dr Dorris Fetch is an exceptionally smart and nice endocrinologist that I believe can help her with her diabetes. If she would be willing to see Dr. Dorris Fetch we can help set up the appointment.

## 2015-07-21 ENCOUNTER — Other Ambulatory Visit: Payer: Self-pay | Admitting: Family Medicine

## 2015-07-23 ENCOUNTER — Encounter: Payer: Self-pay | Admitting: Family Medicine

## 2015-08-16 ENCOUNTER — Encounter: Payer: PPO | Attending: Family Medicine | Admitting: Nutrition

## 2015-08-16 VITALS — Ht 63.0 in | Wt 180.0 lb

## 2015-08-16 DIAGNOSIS — Z794 Long term (current) use of insulin: Secondary | ICD-10-CM | POA: Insufficient documentation

## 2015-08-16 DIAGNOSIS — E1165 Type 2 diabetes mellitus with hyperglycemia: Secondary | ICD-10-CM

## 2015-08-16 DIAGNOSIS — E119 Type 2 diabetes mellitus without complications: Secondary | ICD-10-CM | POA: Diagnosis not present

## 2015-08-16 DIAGNOSIS — E118 Type 2 diabetes mellitus with unspecified complications: Secondary | ICD-10-CM

## 2015-08-16 DIAGNOSIS — IMO0002 Reserved for concepts with insufficient information to code with codable children: Secondary | ICD-10-CM

## 2015-08-16 DIAGNOSIS — E669 Obesity, unspecified: Secondary | ICD-10-CM

## 2015-08-16 NOTE — Progress Notes (Signed)
  Medical Nutrition Therapy:  Appt start time: 6503 end time:  1630.   Assessment:  Primary concerns today:  Diabetes. Type 2. She is here with her daughter. She lives by herself. Doesn't drive. A1C was 9.5%. Currently on  Metformin 500 mg BID and Glimperide 5 mg BID. She notes she sometimes gets dizzy and light headed but unsure if low blood sugars or not. She notes she eats all the time and craves sweets and snacks around the clock. Tests blood sugars in am daily.  BS vary upper 100's to 200's. Eats three meals and 2-3 snacks daily. LIkes to stay up late and eat popcorn. Goes out to eat often with her female friends or neighbors. Take medications as prescribed. Not able to exercise much. Likes to make vegetable soup but doesn't cook a lot of food since it's just her in the house. Diet is excessive in calories, protein and fat and low in fresh fruits and vegetables. Drinks Diet sodas a lot and doesn't drink much water. Willing to change her eating habits to improve blood sugars. Doesn't want to go on insulin.  Preferred Learning Style:   No preference indicated   Learning Readiness:   Ready  Change in progress   MEDICATIONS: See lis   DIETARY INTAKE:  24-hr recall:  B ( AM): Cherrios, OR eggs and bacon,  Diet coke, or crystal light, coffee  Snk ( AM): clemeties Or popcorn   L ( PM):  Sausage biscuit, Diet coke OR  Pork loin,  Or Veg soup;  Snk ( PM): mis D ( PM):  Veg soup with 1 slice bread, craackers,  Snk ( PM): popcorn Beverages: Juice, water, diet sodas  Usual physical activity: ADL  Estimated energy needs: 1200-1500 calories 170 g carbohydrates 112 g protein 42 g fat  Progress Towards Goal(s):  In progress.   Nutritional Diagnosis:  NB-1.1 Food and nutrition-related knowledge deficit As related to Diabetes .  As evidenced by A1C 9.5%..    Intervention:  Nutrition and Diabetes education provided on My Plate, CHO counting, meal planning, portion sizes, timing of meals,  avoiding snacks between meals unless having a low blood sugar, target ranges for A1C and blood sugars, signs/symptoms and treatment of hyper/hypoglycemia, monitoring blood sugars, taking medications as prescribed, benefits of exercising 30 minutes per day and prevention of complications of DM.  Goals: 1 Follow Plate Method 2. Cut out snacks between meals 3. Eat 2-3 carb choices per meal 4. Eat meals at times discussed B) 6-8 am L) 12-2 pm and D)5-7 pm. 5. Do not eat after 7 pm unless veggies. 6. Cut out diet sodas and only drink water 7. Get A1C down to 7.5% 8. Test blood sugar in am AND before going to bed at time a few times per week.    Teaching Method Utilized:  Auditory Hands on  Handouts given during visit include: The Plate Method  Meal Plan Card Diabetes Instructions    Barriers to learning/adherence to lifestyle change:  Demonstrated degree of understanding via:  Teach Back   Monitoring/Evaluation:  Dietary intake, exercise, meal planning, SBG, and body weight in 1 month(s).

## 2015-08-17 NOTE — Patient Instructions (Signed)
Goals: 1 Follow Plate Method 2. Cut out snacks between meals 3. Eat 2-3 carb choices per meal 4. Eat meals at times discussed B) 6-8 am L) 12-2 pm and D)5-7 pm. 5. Do not eat after 7 pm unless veggies. 6. Cut out diet sodas and only drink water 7. Get A1C down to 7.5% 8. Test blood sugar in am AND before going to bed at time a few times per week.

## 2015-08-20 ENCOUNTER — Ambulatory Visit (INDEPENDENT_AMBULATORY_CARE_PROVIDER_SITE_OTHER): Payer: PPO | Admitting: "Endocrinology

## 2015-08-20 ENCOUNTER — Encounter: Payer: Self-pay | Admitting: "Endocrinology

## 2015-08-20 VITALS — BP 142/86 | HR 86 | Ht 63.0 in | Wt 180.0 lb

## 2015-08-20 DIAGNOSIS — E1122 Type 2 diabetes mellitus with diabetic chronic kidney disease: Secondary | ICD-10-CM | POA: Diagnosis not present

## 2015-08-20 DIAGNOSIS — E1165 Type 2 diabetes mellitus with hyperglycemia: Secondary | ICD-10-CM | POA: Diagnosis not present

## 2015-08-20 DIAGNOSIS — N183 Chronic kidney disease, stage 3 (moderate): Secondary | ICD-10-CM | POA: Diagnosis not present

## 2015-08-20 DIAGNOSIS — E785 Hyperlipidemia, unspecified: Secondary | ICD-10-CM | POA: Diagnosis not present

## 2015-08-20 DIAGNOSIS — IMO0002 Reserved for concepts with insufficient information to code with codable children: Secondary | ICD-10-CM

## 2015-08-20 DIAGNOSIS — I10 Essential (primary) hypertension: Secondary | ICD-10-CM | POA: Diagnosis not present

## 2015-08-20 MED ORDER — GLUCOSE BLOOD VI STRP: ORAL_STRIP | Status: DC

## 2015-08-20 NOTE — Patient Instructions (Signed)

## 2015-08-20 NOTE — Progress Notes (Signed)
Subjective:    Patient ID: Victoria Vasquez, female    DOB: 10-27-1927. Patient is being seen in consultation for management of diabetes requested by  Sallee Lange, MD  Past Medical History  Diagnosis Date  . Diabetes mellitus, type II ([place])   . Hypertension   . Hyperlipidemia   . Fatigue   . Shingles   . COPD (chronic obstructive pulmonary disease) ([facility])     Exertional dyspnea; PFTs in 2002: Moderate small airway obstruction, mildly reduced lung volumes and DLCO, nl ABG  . Scoliosis   . Chest pain     Evaluation prior to 2003 reportedly including cath-records never located; Echo in 2009: Mild LVH, LAE2, nl EF, moderate LAE, MR1-2  . Parotid mass 2003    Right-2003  . Asthma   . Legally blind 1 1 2015  . Macular degeneration of both eyes 1 1 2015   Past Surgical History  Procedure Laterality Date  . Cholecystectomy  2003  . Appendectomy  1951  . Colonoscopy  04/16/2006    [place]-Normal colon.  Exam limited due to retained particulate matter in the lumen of the colon, polyps less than 1 cm would have been easily missed.  Otherwise, no polyps, masses, inflammatory changes or vascular ectasia seen/ Normal retroflexed view of the rectum..  recommend repeat exam at 03/2011  . Orif  1996  . Orif ankle fracture  1992   Social History   Social History  . Marital Status: Widowed    Spouse Name: N/A  . Number of Children: 3  . Years of Education: N/A   Occupational History  . Retired    Social History Main Topics  . Smoking status: Passive Smoke Exposure - Never Smoker  . Smokeless tobacco: None  . Alcohol Use: No  . Drug Use: No  . Sexual Activity: Not Asked   Other Topics Concern  . None   Social History Narrative   Widowed   3 children   No regular exercise   Outpatient Encounter Prescriptions as of 08/20/2015  Medication Sig  . amLODipine (NORVASC) 5 MG tablet TAKE ONE TABLET DAILY FOR BLOOD PRESSURE.  Marland Kitchen aspirin 81 MG tablet Take 81 mg by mouth daily.  Marland Kitchen  atorvastatin (LIPITOR) 10 MG tablet TAKE ONE TABLET BY MOUTH EVERY DAY.  Marland Kitchen glipiZIDE (GLUCOTROL) 5 MG tablet Take 5 mg by mouth 2 (two) times daily with a meal.  . latanoprost (XALATAN) 0.005 % ophthalmic solution Place 1 drop into the right eye at bedtime.  Marland Kitchen losartan (COZAAR) 50 MG tablet Take 1 tablet (50 mg total) by mouth daily.  . Multiple Vitamins-Minerals (ICAPS) CAPS Take by mouth 2 (two) times daily. Reported on 07/05/2015  . pantoprazole (PROTONIX) 40 MG tablet Take 1 tablet (40 mg total) by mouth daily.  . SYMBICORT 160-4.5 MCG/ACT inhaler USE 2 PUFFS TWICE DAILY FOR ASTHMA. RINSE MOUTH AFTER USE.  . [DISCONTINUED] glipiZIDE (GLUCOTROL) 5 MG tablet Take 2 tablets qam and 2 tablets at supper  . glucose blood (ONE TOUCH TEST STRIPS) test strip Use as instructed to test blood glucose 4 times a day  . ONE TOUCH ULTRA TEST test strip USE TO TEST ONCE DAILY.  . [DISCONTINUED] metFORMIN (GLUCOPHAGE) 500 MG tablet Take 1 tablet (500 mg total) by mouth 2 (two) times daily with a meal. (Patient not taking: Reported on 08/16/2015)   No facility-administered encounter medications on file as of 08/20/2015.   ALLERGIES: Allergies  Allergen Reactions  . Lisinopril Cough  cough   VACCINATION STATUS: Immunization History  Administered Date(s) Administered  . Influenza Split 03/07/2013  . Influenza,inj,Quad PF,36+ Mos 03/27/2014, 03/27/2015  . Pneumococcal Conjugate-13 05/18/2014    Diabetes She presents for her initial diabetic visit. She has type 2 diabetes mellitus. Onset time: She was diagnosed at approximate age of 34 years. Her disease course has been worsening. There are no hypoglycemic associated symptoms. Pertinent negatives for hypoglycemia include no confusion, headaches, pallor or seizures. Associated symptoms include fatigue, polydipsia and polyuria. Pertinent negatives for diabetes include no chest pain and no polyphagia. There are no hypoglycemic complications. Symptoms are  worsening. Risk factors for coronary artery disease include diabetes mellitus, dyslipidemia, hypertension, obesity and sedentary lifestyle. Her weight is decreasing steadily. She is following a generally unhealthy diet. Meal planning includes avoidance of concentrated sweets (She recently change her diet significantly avoiding processed and concentrated carbohydrates.). She has had a previous visit with a dietitian. She never participates in exercise. Home blood sugar record trend: Recently she started to monitor blood glucose regularly her average blood glucose for the last 30 days is 164. She has seen Jearld Fenton, CDE . Her breakfast blood glucose range is generally 140-180 mg/dl. An ACE inhibitor/angiotensin II receptor blocker is being taken. Eye exam is current.  Hyperlipidemia This is a chronic problem. The current episode started more than 1 year ago. Pertinent negatives include no chest pain, myalgias or shortness of breath. Current antihyperlipidemic treatment includes statins. Risk factors for coronary artery disease include dyslipidemia, diabetes mellitus, hypertension, obesity and a sedentary lifestyle.  Hypertension This is a chronic problem. The current episode started more than 1 year ago. Pertinent negatives include no chest pain, headaches, palpitations or shortness of breath. Past treatments include angiotensin blockers.      Review of Systems  Constitutional: Positive for fatigue. Negative for fever, chills and unexpected weight change.  HENT: Negative for trouble swallowing and voice change.   Eyes: Negative for visual disturbance.  Respiratory: Negative for cough, shortness of breath and wheezing.   Cardiovascular: Negative for chest pain, palpitations and leg swelling.  Gastrointestinal: Negative for nausea, vomiting and diarrhea.  Endocrine: Positive for polydipsia and polyuria. Negative for cold intolerance, heat intolerance and polyphagia.  Musculoskeletal: Positive for  back pain, arthralgias and gait problem. Negative for myalgias.  Skin: Negative for color change, pallor, rash and wound.  Neurological: Negative for seizures and headaches.  Psychiatric/Behavioral: Negative for suicidal ideas and confusion.    Objective:    BP 142/86 mmHg  Pulse 86  Ht '5\' 3"'$  (1.6 m)  Wt 180 lb (81.647 kg)  BMI 31.89 kg/m2  SpO2 98%  Wt Readings from Last 3 Encounters:  08/20/15 180 lb (81.647 kg)  08/16/15 180 lb (81.647 kg)  07/05/15 183 lb (83.008 kg)    Physical Exam  Constitutional: She is oriented to person, place, and time. She appears well-developed.  HENT:  Head: Normocephalic and atraumatic.  Eyes: EOM are normal.  Neck: Normal range of motion. Neck supple. No tracheal deviation present. No thyromegaly present.  Cardiovascular: Normal rate and regular rhythm.   Pulmonary/Chest: Effort normal and breath sounds normal.  Abdominal: Soft. Bowel sounds are normal. There is no tenderness. There is no guarding.  Musculoskeletal: Normal range of motion. She exhibits no edema.  Neurological: She is alert and oriented to person, place, and time. She has normal reflexes. No cranial nerve deficit. Coordination normal.  Skin: Skin is warm and dry. No rash noted. No erythema. No pallor.  Psychiatric:  She has a normal mood and affect. Judgment normal.     CMP ( most recent) CMP     Component Value Date/Time   NA 143 06/22/2015 0904   NA 143 08/17/2013 0846   K 4.6 06/22/2015 0904   CL 103 06/22/2015 0904   CO2 25 06/22/2015 0904   GLUCOSE 163* 06/22/2015 0904   GLUCOSE 134* 08/17/2013 0846   BUN 17 06/22/2015 0904   BUN 16 08/17/2013 0846   CREATININE 1.16* 06/22/2015 0904   CREATININE 1.05 08/17/2013 0846   CALCIUM 9.2 06/22/2015 0904   PROT 7.0 06/22/2015 0904   PROT 6.6 08/17/2013 0846   PROT 5.7 01/22/2012 1316   ALBUMIN 4.7 06/22/2015 0904   ALBUMIN 4.2 08/17/2013 0846   AST 21 06/22/2015 0904   AST 14 01/22/2012 1316   ALT 19 06/22/2015 0904    ALKPHOS 98 06/22/2015 0904   ALKPHOS 48 01/22/2012 1316   BILITOT 0.5 06/22/2015 0904   BILITOT 0.5 08/17/2013 0846   BILITOT 0.4 01/22/2012 1316   GFRNONAA 43* 06/22/2015 0904   GFRAA 50* 06/22/2015 0904     Diabetic Labs (most recent): Lab Results  Component Value Date   HGBA1C 9.5* 06/22/2015   HGBA1C 8.7 03/27/2015   HGBA1C 7.6 11/28/2014     Lipid Panel ( most recent) Lipid Panel     Component Value Date/Time   CHOL 189 06/22/2015 0904   CHOL 261* 08/17/2013 0846   TRIG 182* 06/22/2015 0904   TRIG 177 09/04/2008   HDL 54 06/22/2015 0904   HDL 44 08/17/2013 0846   CHOLHDL 3.5 06/22/2015 0904   CHOLHDL 5.9 08/17/2013 0846   VLDL 42* 08/17/2013 0846   LDLCALC 99 06/22/2015 0904   LDLCALC 175* 08/17/2013 0846   LDLCALC 100 09/04/2008      Assessment & Plan:   1. Uncontrolled type 2 diabetes mellitus with stage 3 chronic kidney disease, without long-term current use of insulin ([place]) - Patient has currently uncontrolled symptomatic type 2 DM since   82 years of age,  with most recent A1c of 9.5 %. Recent labs reviewed.  -She does not report major complications, however patient remains at a high risk for more acute and chronic complications of diabetes which include CAD, CVA, CKD, retinopathy, and neuropathy. These are all discussed in detail with the patient.  - I have counseled the patient on diet management and weight loss, by adopting a carbohydrate restricted/protein rich diet.  - Suggestion is made for patient to avoid simple carbohydrates   from their diet including Cakes , Desserts, Ice Cream,  Soda (  diet and regular) , Sweet Tea , Candies,  Chips, Cookies, Artificial Sweeteners,   and "Sugar-free" Products . This will help patient to have stable blood glucose profile and potentially avoid unintended weight gain.  - I encouraged the patient to switch to  unprocessed or minimally processed complex starch and increased protein intake (animal or plant source),  fruits, and vegetables.  - Patient is advised to stick to a routine mealtimes to eat 3 meals  a day and avoid unnecessary snacks ( to snack only to correct hypoglycemia).  - The patient will be scheduled with Jearld Fenton, RDN, CDE for individualized DM education.  - I have approached patient with the following individualized plan to manage diabetes and patient agrees:   - I  Will initiate strict monitoring of glucose  AC and HS, for a week. - If she has significant glycemic burden, she will be considered  for short term insulin treatment, or adding Tradjenta/Januvia. -For now, I will lower her glipizide to 5 mg by mouth twice a day. -She is not a candidate for metformin nor SGLT2 inhibitors due to CK D.  - Patient specific target  A1c;  LDL, HDL, Triglycerides, and  Waist Circumference were discussed in detail.  2) BP/HTN: Controlled. Continue current medications including ACEI/ARB. 3) Lipids/HPL:  Controlled, continue statins. 4)  Weight/Diet: CDE Consult will be initiated , exercise, and detailed carbohydrates information provided.  5) Chronic Care/Health Maintenance:  -Patient is on ACEI/ARB and Statin medications and encouraged to continue to follow up with Ophthalmology, Podiatrist at least yearly or according to recommendations, and advised to   stay away from smoking. I have recommended yearly flu vaccine and pneumonia vaccination at least every 5 years; moderate intensity exercise for up to 150 minutes weekly; and  sleep for at least 7 hours a day.  - 60 minutes of time was spent on the care of this patient , 50% of which was applied for counseling on diabetes complications and their preventions.  - Patient to bring meter and  blood glucose logs during their next visit.   - I advised patient to maintain close follow up with Sallee Lange, MD for primary care needs.  Follow up plan: - Return in about 1 week (around 08/27/2015) for diabetes, high blood pressure, high cholesterol,  follow up with meter and logs- no labs.  Glade Lloyd, MD Phone: [phone]  Fax: [phone]   08/20/2015, 11:34 AM

## 2015-08-27 ENCOUNTER — Encounter: Payer: Self-pay | Admitting: "Endocrinology

## 2015-08-27 ENCOUNTER — Ambulatory Visit (INDEPENDENT_AMBULATORY_CARE_PROVIDER_SITE_OTHER): Payer: PPO | Admitting: "Endocrinology

## 2015-08-27 VITALS — BP 141/84 | HR 75 | Ht 63.0 in | Wt 179.0 lb

## 2015-08-27 DIAGNOSIS — I10 Essential (primary) hypertension: Secondary | ICD-10-CM | POA: Diagnosis not present

## 2015-08-27 DIAGNOSIS — E785 Hyperlipidemia, unspecified: Secondary | ICD-10-CM | POA: Diagnosis not present

## 2015-08-27 DIAGNOSIS — E1165 Type 2 diabetes mellitus with hyperglycemia: Secondary | ICD-10-CM | POA: Diagnosis not present

## 2015-08-27 DIAGNOSIS — E1122 Type 2 diabetes mellitus with diabetic chronic kidney disease: Secondary | ICD-10-CM

## 2015-08-27 DIAGNOSIS — N183 Chronic kidney disease, stage 3 (moderate): Secondary | ICD-10-CM | POA: Diagnosis not present

## 2015-08-27 DIAGNOSIS — IMO0002 Reserved for concepts with insufficient information to code with codable children: Secondary | ICD-10-CM

## 2015-08-27 MED ORDER — LINAGLIPTIN 5 MG PO TABS: 5.0000 mg | ORAL_TABLET | Freq: Every day | ORAL | Status: DC

## 2015-08-27 NOTE — Progress Notes (Signed)
Subjective:    Patient ID: Victoria Vasquez, female    DOB: 06/15/1927. Patient is being seen in consultation for management of diabetes requested by  Sallee Lange, MD  Past Medical History  Diagnosis Date  . Diabetes mellitus, type II ([place])   . Hypertension   . Hyperlipidemia   . Fatigue   . Shingles   . COPD (chronic obstructive pulmonary disease) ([facility])     Exertional dyspnea; PFTs in 2002: Moderate small airway obstruction, mildly reduced lung volumes and DLCO, nl ABG  . Scoliosis   . Chest pain     Evaluation prior to 2003 reportedly including cath-records never located; Echo in 2009: Mild LVH, LAE2, nl EF, moderate LAE, MR1-2  . Parotid mass 2003    Right-2003  . Asthma   . Legally blind 1 1 2015  . Macular degeneration of both eyes 1 1 2015   Past Surgical History  Procedure Laterality Date  . Cholecystectomy  2003  . Appendectomy  1951  . Colonoscopy  04/16/2006    [place]-Normal colon.  Exam limited due to retained particulate matter in the lumen of the colon, polyps less than 1 cm would have been easily missed.  Otherwise, no polyps, masses, inflammatory changes or vascular ectasia seen/ Normal retroflexed view of the rectum..  recommend repeat exam at 03/2011  . Orif  1996  . Orif ankle fracture  1992   Social History   Social History  . Marital Status: Widowed    Spouse Name: N/A  . Number of Children: 3  . Years of Education: N/A   Occupational History  . Retired    Social History Main Topics  . Smoking status: Passive Smoke Exposure - Never Smoker  . Smokeless tobacco: None  . Alcohol Use: No  . Drug Use: No  . Sexual Activity: Not Asked   Other Topics Concern  . None   Social History Narrative   Widowed   3 children   No regular exercise   Outpatient Encounter Prescriptions as of 08/27/2015  Medication Sig  . amLODipine (NORVASC) 5 MG tablet TAKE ONE TABLET DAILY FOR BLOOD PRESSURE.  Marland Kitchen aspirin 81 MG tablet Take 81 mg by mouth daily.  Marland Kitchen  atorvastatin (LIPITOR) 10 MG tablet TAKE ONE TABLET BY MOUTH EVERY DAY.  Marland Kitchen glipiZIDE (GLUCOTROL) 5 MG tablet Take 5 mg by mouth 2 (two) times daily with a meal.  . glucose blood (ONE TOUCH TEST STRIPS) test strip Use as instructed to test blood glucose 4 times a day  . latanoprost (XALATAN) 0.005 % ophthalmic solution Place 1 drop into the right eye at bedtime.  Marland Kitchen linagliptin (TRADJENTA) 5 MG TABS tablet Take 1 tablet (5 mg total) by mouth daily.  Marland Kitchen losartan (COZAAR) 50 MG tablet Take 1 tablet (50 mg total) by mouth daily.  . Multiple Vitamins-Minerals (ICAPS) CAPS Take by mouth 2 (two) times daily. Reported on 07/05/2015  . ONE TOUCH ULTRA TEST test strip USE TO TEST ONCE DAILY.  . pantoprazole (PROTONIX) 40 MG tablet Take 1 tablet (40 mg total) by mouth daily.  . SYMBICORT 160-4.5 MCG/ACT inhaler USE 2 PUFFS TWICE DAILY FOR ASTHMA. RINSE MOUTH AFTER USE.   No facility-administered encounter medications on file as of 08/27/2015.   ALLERGIES: Allergies  Allergen Reactions  . Lisinopril Cough    cough   VACCINATION STATUS: Immunization History  Administered Date(s) Administered  . Influenza Split 03/07/2013  . Influenza,inj,Quad PF,36+ Mos 03/27/2014, 03/27/2015  . Pneumococcal Conjugate-13  05/18/2014    Diabetes She presents for her initial diabetic visit. She has type 2 diabetes mellitus. Onset time: She was diagnosed at approximate age of 44 years. Her disease course has been improving. There are no hypoglycemic associated symptoms. Pertinent negatives for hypoglycemia include no confusion, headaches, pallor or seizures. Associated symptoms include fatigue, polydipsia and polyuria. Pertinent negatives for diabetes include no chest pain and no polyphagia. There are no hypoglycemic complications. Symptoms are improving. Risk factors for coronary artery disease include diabetes mellitus, dyslipidemia, hypertension, obesity and sedentary lifestyle. Her weight is stable. She is following a  generally unhealthy diet. Meal planning includes avoidance of concentrated sweets (She recently change her diet significantly avoiding processed and concentrated carbohydrates.). She has had a previous visit with a dietitian. She never participates in exercise. Home blood sugar record trend: Recently she started to monitor blood glucose regularly her average blood glucose for the last 7 days is 199. She has seen Jearld Fenton, CDE . Her breakfast blood glucose range is generally 140-180 mg/dl. Her lunch blood glucose range is generally 180-200 mg/dl. Her dinner blood glucose range is generally 180-200 mg/dl. Her overall blood glucose range is 180-200 mg/dl. An ACE inhibitor/angiotensin II receptor blocker is being taken. Eye exam is current.  Hyperlipidemia This is a chronic problem. The current episode started more than 1 year ago. Pertinent negatives include no chest pain, myalgias or shortness of breath. Current antihyperlipidemic treatment includes statins. Risk factors for coronary artery disease include dyslipidemia, diabetes mellitus, hypertension, obesity and a sedentary lifestyle.  Hypertension This is a chronic problem. The current episode started more than 1 year ago. Pertinent negatives include no chest pain, headaches, palpitations or shortness of breath. Past treatments include angiotensin blockers.      Review of Systems  Constitutional: Positive for fatigue. Negative for fever, chills and unexpected weight change.  HENT: Negative for trouble swallowing and voice change.   Eyes: Negative for visual disturbance.  Respiratory: Negative for cough, shortness of breath and wheezing.   Cardiovascular: Negative for chest pain, palpitations and leg swelling.  Gastrointestinal: Negative for nausea, vomiting and diarrhea.  Endocrine: Positive for polydipsia and polyuria. Negative for cold intolerance, heat intolerance and polyphagia.  Musculoskeletal: Positive for back pain, arthralgias and  gait problem. Negative for myalgias.  Skin: Negative for color change, pallor, rash and wound.  Neurological: Negative for seizures and headaches.  Psychiatric/Behavioral: Negative for suicidal ideas and confusion.    Objective:    BP 141/84 mmHg  Pulse 75  Ht '5\' 3"'$  (1.6 m)  Wt 179 lb (81.194 kg)  BMI 31.72 kg/m2  SpO2 96%  Wt Readings from Last 3 Encounters:  08/27/15 179 lb (81.194 kg)  08/20/15 180 lb (81.647 kg)  08/16/15 180 lb (81.647 kg)    Physical Exam  Constitutional: She is oriented to person, place, and time. She appears well-developed.  HENT:  Head: Normocephalic and atraumatic.  Eyes: EOM are normal.  Neck: Normal range of motion. Neck supple. No tracheal deviation present. No thyromegaly present.  Cardiovascular: Normal rate and regular rhythm.   Pulmonary/Chest: Effort normal and breath sounds normal.  Abdominal: Soft. Bowel sounds are normal. There is no tenderness. There is no guarding.  Musculoskeletal: Normal range of motion. She exhibits no edema.  Neurological: She is alert and oriented to person, place, and time. She has normal reflexes. No cranial nerve deficit. Coordination normal.  Skin: Skin is warm and dry. No rash noted. No erythema. No pallor.  Psychiatric: She has  a normal mood and affect. Judgment normal.     CMP ( most recent) CMP     Component Value Date/Time   NA 143 06/22/2015 0904   NA 143 08/17/2013 0846   K 4.6 06/22/2015 0904   CL 103 06/22/2015 0904   CO2 25 06/22/2015 0904   GLUCOSE 163* 06/22/2015 0904   GLUCOSE 134* 08/17/2013 0846   BUN 17 06/22/2015 0904   BUN 16 08/17/2013 0846   CREATININE 1.16* 06/22/2015 0904   CREATININE 1.05 08/17/2013 0846   CALCIUM 9.2 06/22/2015 0904   PROT 7.0 06/22/2015 0904   PROT 6.6 08/17/2013 0846   PROT 5.7 01/22/2012 1316   ALBUMIN 4.7 06/22/2015 0904   ALBUMIN 4.2 08/17/2013 0846   AST 21 06/22/2015 0904   AST 14 01/22/2012 1316   ALT 19 06/22/2015 0904   ALKPHOS 98 06/22/2015  0904   ALKPHOS 48 01/22/2012 1316   BILITOT 0.5 06/22/2015 0904   BILITOT 0.5 08/17/2013 0846   BILITOT 0.4 01/22/2012 1316   GFRNONAA 43* 06/22/2015 0904   GFRAA 50* 06/22/2015 0904     Diabetic Labs (most recent): Lab Results  Component Value Date   HGBA1C 9.5* 06/22/2015   HGBA1C 8.7 03/27/2015   HGBA1C 7.6 11/28/2014     Lipid Panel ( most recent) Lipid Panel     Component Value Date/Time   CHOL 189 06/22/2015 0904   CHOL 261* 08/17/2013 0846   TRIG 182* 06/22/2015 0904   TRIG 177 09/04/2008   HDL 54 06/22/2015 0904   HDL 44 08/17/2013 0846   CHOLHDL 3.5 06/22/2015 0904   CHOLHDL 5.9 08/17/2013 0846   VLDL 42* 08/17/2013 0846   LDLCALC 99 06/22/2015 0904   LDLCALC 175* 08/17/2013 0846   LDLCALC 100 09/04/2008      Assessment & Plan:   1. Uncontrolled type 2 diabetes mellitus with stage 3 chronic kidney disease, without long-term current use of insulin ([place]) - Patient has currently uncontrolled symptomatic type 2 DM since   82 years of age,  with most recent A1c of 9.5 %. Recent labs reviewed.  -She does not report major complications, however patient remains at a high risk for more acute and chronic complications of diabetes which include CAD, CVA, CKD, retinopathy, and neuropathy. These are all discussed in detail with the patient.  - I have counseled the patient on diet management and weight loss, by adopting a carbohydrate restricted/protein rich diet.  - Suggestion is made for patient to avoid simple carbohydrates   from their diet including Cakes , Desserts, Ice Cream,  Soda (  diet and regular) , Sweet Tea , Candies,  Chips, Cookies, Artificial Sweeteners,   and "Sugar-free" Products . This will help patient to have stable blood glucose profile and potentially avoid unintended weight gain.  - I encouraged the patient to switch to  unprocessed or minimally processed complex starch and increased protein intake (animal or plant source), fruits, and  vegetables.  - Patient is advised to stick to a routine mealtimes to eat 3 meals  a day and avoid unnecessary snacks ( to snack only to correct hypoglycemia).  - The patient will be scheduled with Jearld Fenton, RDN, CDE for individualized DM education.  - I have approached patient with the following individualized plan to manage diabetes and patient agrees:   - She came with better blood glucose profile averaging 199 over the last 7 days. -  She is at risk of hypoglycemia, not a good candidate to initiate  insulin on. -For now, I will continue her glipizide at 5 mg by mouth twice a day. - I will add Tradjenta 5 mg by mouth every morning with breakfast. I discussed side effects and precautions with her. -She is not a candidate for metformin nor SGLT2 inhibitors due to CKD.  - Patient specific target  A1c;  LDL, HDL, Triglycerides, and  Waist Circumference were discussed in detail.  2) BP/HTN: Controlled. Continue current medications including ACEI/ARB. 3) Lipids/HPL:  Controlled, continue statins. 4)  Weight/Diet: CDE Consult will be initiated , exercise, and detailed carbohydrates information provided.  5) Chronic Care/Health Maintenance:  -Patient is on ACEI/ARB and Statin medications and encouraged to continue to follow up with Ophthalmology, Podiatrist at least yearly or according to recommendations, and advised to   stay away from smoking. I have recommended yearly flu vaccine and pneumonia vaccination at least every 5 years; moderate intensity exercise for up to 150 minutes weekly; and  sleep for at least 7 hours a day.  - 6mnutes of time was spent on the care of this patient , 50% of which was applied for counseling on diabetes complications and their preventions.  - Patient to bring meter and  blood glucose logs during their next visit.   - I advised patient to maintain close follow up with SSallee Lange MD for primary care needs.  Follow up plan: - Return in about 10 weeks  (around 11/05/2015) for diabetes, high blood pressure, high cholesterol, follow up with pre-visit labs, meter, and logs.  GGlade Lloyd MD Phone: [phone] Fax: [phone]  08/27/2015, 11:28 AM

## 2015-08-27 NOTE — Patient Instructions (Signed)

## 2015-08-30 ENCOUNTER — Other Ambulatory Visit: Payer: Self-pay | Admitting: Family Medicine

## 2015-09-20 ENCOUNTER — Encounter: Payer: Self-pay | Admitting: Nutrition

## 2015-09-20 ENCOUNTER — Encounter: Payer: PPO | Attending: Family Medicine | Admitting: Nutrition

## 2015-09-20 VITALS — Ht 63.0 in | Wt 179.4 lb

## 2015-09-20 DIAGNOSIS — E118 Type 2 diabetes mellitus with unspecified complications: Secondary | ICD-10-CM

## 2015-09-20 DIAGNOSIS — IMO0002 Reserved for concepts with insufficient information to code with codable children: Secondary | ICD-10-CM

## 2015-09-20 DIAGNOSIS — Z794 Long term (current) use of insulin: Secondary | ICD-10-CM | POA: Insufficient documentation

## 2015-09-20 DIAGNOSIS — E119 Type 2 diabetes mellitus without complications: Secondary | ICD-10-CM | POA: Insufficient documentation

## 2015-09-20 DIAGNOSIS — E1165 Type 2 diabetes mellitus with hyperglycemia: Secondary | ICD-10-CM

## 2015-09-20 NOTE — Patient Instructions (Signed)
Goals: Keep up the good job!! 1 Follow Plate Method 2. Eat 2-3 carb choices per meal 4. Eat meals at times discussed B) 6-8 am L) 12-2 pm and D)5-7 pm. 6.  Get A1C down to 7.5% 8. Test blood sugar in am AND before going to bed at time a few times per week.

## 2015-09-20 NOTE — Progress Notes (Signed)
  Medical Nutrition Therapy:  Appt start time: 3383 end time:  1630.   Assessment:  Primary concerns today:  Diabetes. Type 2. On Trajenta and 5 mg of Glipizide BID. No low blood sugar.  FBS 99-125 mg/dl. Cut snacks, sweets. Trajenta and Glipide 5 mg BID instead of 10 mg BID. Has cut out diet sodas, juices, tea and junk food and snacks. Meter brought in. AVG bs 150-170's for 30 days. BS doing much better. Feels good. Gets hungry at times. LImited mobility due to back problems. Fixes a lot of meals at home. Diet is doing very well and fairly well balanced.  Preferred Learning Style:   No preference indicated   Learning Readiness:   Ready  Change in progress   MEDICATIONS: See lis   DIETARY INTAKE:  24-hr recall:  B ( AM): Cherrios, OR eggs and bacon,  Diet coke, or crystal light, coffee  Snk ( AM):   L ( PM): Hamburger, l/t/onion and hasbrowns and clementines, water   Snk ( PM):  D ( PM):  Cabbage, peas, cornbread, [place] and toss salad, water Snk ( PM):  Beverages:  Usual physical activity: ADL  Estimated energy needs: 1200-1500 calories 170 g carbohydrates 112 g protein 42 g fat  Progress Towards Goal(s):  In progress.   Nutritional Diagnosis:  NB-1.1 Food and nutrition-related knowledge deficit As related to Diabetes .  As evidenced by A1C 9.5%..    Intervention:  Nutrition and Diabetes education provided on My Plate, CHO counting, meal planning, portion sizes, timing of meals, avoiding snacks between meals unless having a low blood sugar, target ranges for A1C and blood sugars, signs/symptoms and treatment of hyper/hypoglycemia, monitoring blood sugars, taking medications as prescribed, benefits of exercising 30 minutes per day and prevention of complications of DM.  Goals: Keep up the good job!! 1 Follow Plate Method 2. Eat 2-3 carb choices per meal 4. Eat meals at times discussed B) 6-8 am L) 12-2 pm and D)5-7 pm. 6.  Get A1C down to 7.5% 8. Test blood sugar in  am AND before going to bed at time a few times per week.    Teaching Method Utilized:  Auditory Hands on  Handouts given during visit include: The Plate Method  Meal Plan Card Diabetes Instructions   Barriers to learning/adherence to lifestyle change:  Demonstrated degree of understanding via:  Teach Back   Monitoring/Evaluation:  Dietary intake, exercise, meal planning, SBG, and body weight in 1 month(s).

## 2015-09-21 ENCOUNTER — Other Ambulatory Visit: Payer: Self-pay | Admitting: Family Medicine

## 2015-10-02 ENCOUNTER — Ambulatory Visit (INDEPENDENT_AMBULATORY_CARE_PROVIDER_SITE_OTHER): Payer: PPO | Admitting: Family Medicine

## 2015-10-02 ENCOUNTER — Encounter: Payer: Self-pay | Admitting: Family Medicine

## 2015-10-02 VITALS — BP 112/74 | Ht 63.0 in | Wt 178.8 lb

## 2015-10-02 DIAGNOSIS — I10 Essential (primary) hypertension: Secondary | ICD-10-CM

## 2015-10-02 MED ORDER — AMLODIPINE BESYLATE 2.5 MG PO TABS
2.5000 mg | ORAL_TABLET | Freq: Every day | ORAL | Status: DC
Start: 2015-10-02 — End: 2015-12-31

## 2015-10-02 NOTE — Progress Notes (Signed)
   Subjective:    Patient ID: Victoria Vasquez, female    DOB: 06-07-1927, 82 y.o.   MRN: [id number]  Hypertension This is a chronic problem. The current episode started more than 1 year ago. Pertinent negatives include no chest pain. Risk factors for coronary artery disease include diabetes mellitus and post-menopausal state. Treatments tried: cozaar, norvasc. There are no compliance problems.    Currently seeing Dr Dorris Fetch for diabetes-sugars running better Patient states occasionally when she stands up she gets a little bit dizzy. Is able to do limited walking without too much trouble but if she tries to walk too far she gets hips and low back discomfort patient does have moderate kyphosis  Review of Systems  Constitutional: Negative for activity change, appetite change and fatigue.  HENT: Negative for congestion.   Respiratory: Negative for cough.   Cardiovascular: Negative for chest pain.  Gastrointestinal: Negative for abdominal pain.  Endocrine: Negative for polydipsia and polyphagia.  Neurological: Negative for weakness.  Psychiatric/Behavioral: Negative for confusion.       Objective:   Physical Exam  Constitutional: She appears well-nourished. No distress.  Cardiovascular: Normal rate, regular rhythm and normal heart sounds.   No murmur heard. Pulmonary/Chest: Effort normal and breath sounds normal. No respiratory distress.  Musculoskeletal: She exhibits no edema.  Lymphadenopathy:    She has no cervical adenopathy.  Neurological: She is alert. She exhibits normal muscle tone.  Psychiatric: Her behavior is normal.  Vitals reviewed.         Assessment & Plan:  Diabetes followed by Dr. Dorris Fetch. Diabetic foot exam normal Hypertension-blood pressure drops too much when she stands reduce amlodipine new dose 2.5 mg daily continue losartan Recheck patient in 4 months Healthy diet recommended

## 2015-11-02 ENCOUNTER — Other Ambulatory Visit: Payer: Self-pay | Admitting: "Endocrinology

## 2015-11-02 DIAGNOSIS — E1165 Type 2 diabetes mellitus with hyperglycemia: Secondary | ICD-10-CM | POA: Diagnosis not present

## 2015-11-02 DIAGNOSIS — E1122 Type 2 diabetes mellitus with diabetic chronic kidney disease: Secondary | ICD-10-CM | POA: Diagnosis not present

## 2015-11-02 DIAGNOSIS — N183 Chronic kidney disease, stage 3 (moderate): Secondary | ICD-10-CM | POA: Diagnosis not present

## 2015-11-03 LAB — COMPREHENSIVE METABOLIC PANEL
ALT: 11 IU/L (ref 0–32)
AST: 19 IU/L (ref 0–40)
Albumin/Globulin Ratio: 2.1 (ref 1.2–2.2)
Albumin: 4.7 g/dL (ref 3.5–4.7)
Alkaline Phosphatase: 84 IU/L (ref 39–117)
BUN/Creatinine Ratio: 16 (ref 12–28)
BUN: 20 mg/dL (ref 8–27)
Bilirubin Total: 0.4 mg/dL (ref 0.0–1.2)
CALCIUM: 9.3 mg/dL (ref 8.7–10.3)
CO2: 21 mmol/L (ref 18–29)
CREATININE: 1.26 mg/dL — AB (ref 0.57–1.00)
Chloride: 105 mmol/L (ref 96–106)
GFR, EST AFRICAN AMERICAN: 45 mL/min/{1.73_m2} — AB (ref >59)
GFR, EST NON AFRICAN AMERICAN: 39 mL/min/{1.73_m2} — AB (ref >59)
Globulin, Total: 2.2 g/dL (ref 1.5–4.5)
Glucose: 120 mg/dL — ABNORMAL HIGH (ref 65–99)
POTASSIUM: 4.7 mmol/L (ref 3.5–5.2)
Sodium: 146 mmol/L — ABNORMAL HIGH (ref 134–144)
TOTAL PROTEIN: 6.9 g/dL (ref 6.0–8.5)

## 2015-11-03 LAB — HGB A1C W/O EAG: Hgb A1c MFr Bld: 7 % — ABNORMAL HIGH (ref 4.8–5.6)

## 2015-11-09 ENCOUNTER — Encounter: Payer: PPO | Attending: Family Medicine | Admitting: Nutrition

## 2015-11-09 ENCOUNTER — Ambulatory Visit (INDEPENDENT_AMBULATORY_CARE_PROVIDER_SITE_OTHER): Payer: PPO | Admitting: "Endocrinology

## 2015-11-09 ENCOUNTER — Encounter: Payer: Self-pay | Admitting: Nutrition

## 2015-11-09 ENCOUNTER — Encounter: Payer: Self-pay | Admitting: "Endocrinology

## 2015-11-09 VITALS — Ht 63.0 in | Wt 174.0 lb

## 2015-11-09 VITALS — BP 146/85 | HR 75 | Ht 63.0 in | Wt 174.0 lb

## 2015-11-09 DIAGNOSIS — IMO0002 Reserved for concepts with insufficient information to code with codable children: Secondary | ICD-10-CM

## 2015-11-09 DIAGNOSIS — E1122 Type 2 diabetes mellitus with diabetic chronic kidney disease: Secondary | ICD-10-CM | POA: Diagnosis not present

## 2015-11-09 DIAGNOSIS — N183 Chronic kidney disease, stage 3 (moderate): Secondary | ICD-10-CM | POA: Diagnosis not present

## 2015-11-09 DIAGNOSIS — E1165 Type 2 diabetes mellitus with hyperglycemia: Secondary | ICD-10-CM

## 2015-11-09 DIAGNOSIS — Z794 Long term (current) use of insulin: Secondary | ICD-10-CM | POA: Insufficient documentation

## 2015-11-09 DIAGNOSIS — I10 Essential (primary) hypertension: Secondary | ICD-10-CM | POA: Diagnosis not present

## 2015-11-09 DIAGNOSIS — E669 Obesity, unspecified: Secondary | ICD-10-CM

## 2015-11-09 DIAGNOSIS — E785 Hyperlipidemia, unspecified: Secondary | ICD-10-CM | POA: Diagnosis not present

## 2015-11-09 DIAGNOSIS — E119 Type 2 diabetes mellitus without complications: Secondary | ICD-10-CM | POA: Insufficient documentation

## 2015-11-09 NOTE — Progress Notes (Signed)
Subjective:    Patient ID: Victoria Vasquez, female    DOB: 03-02-1928. Patient is being seen in consultation for management of diabetes requested by  Sallee Lange, MD  Past Medical History  Diagnosis Date  . Diabetes mellitus, type II ([place])   . Hypertension   . Hyperlipidemia   . Fatigue   . Shingles   . COPD (chronic obstructive pulmonary disease) ([facility])     Exertional dyspnea; PFTs in 2002: Moderate small airway obstruction, mildly reduced lung volumes and DLCO, nl ABG  . Scoliosis   . Chest pain     Evaluation prior to 2003 reportedly including cath-records never located; Echo in 2009: Mild LVH, LAE2, nl EF, moderate LAE, MR1-2  . Parotid mass 2003    Right-2003  . Asthma   . Legally blind 1 1 2015  . Macular degeneration of both eyes 1 1 2015  . Chronic kidney disease    Past Surgical History  Procedure Laterality Date  . Cholecystectomy  2003  . Appendectomy  1951  . Colonoscopy  04/16/2006    [place]-Normal colon.  Exam limited due to retained particulate matter in the lumen of the colon, polyps less than 1 cm would have been easily missed.  Otherwise, no polyps, masses, inflammatory changes or vascular ectasia seen/ Normal retroflexed view of the rectum..  recommend repeat exam at 03/2011  . Orif  1996  . Orif ankle fracture  1992   Social History   Social History  . Marital Status: Widowed    Spouse Name: N/A  . Number of Children: 3  . Years of Education: N/A   Occupational History  . Retired    Social History Main Topics  . Smoking status: Passive Smoke Exposure - Never Smoker  . Smokeless tobacco: None  . Alcohol Use: No  . Drug Use: No  . Sexual Activity: Not Asked   Other Topics Concern  . None   Social History Narrative   Widowed   3 children   No regular exercise   Outpatient Encounter Prescriptions as of 11/09/2015  Medication Sig  . glipiZIDE (GLUCOTROL) 5 MG tablet Take 5 mg by mouth daily with breakfast.  . amLODipine (NORVASC) 2.5 MG  tablet Take 1 tablet (2.5 mg total) by mouth daily.  Marland Kitchen aspirin 81 MG tablet Take 81 mg by mouth daily.  Marland Kitchen atorvastatin (LIPITOR) 10 MG tablet TAKE ONE TABLET BY MOUTH EVERY DAY.  Marland Kitchen glucose blood (ONE TOUCH TEST STRIPS) test strip Use as instructed to test blood glucose 4 times a day  . latanoprost (XALATAN) 0.005 % ophthalmic solution Place 1 drop into the right eye at bedtime.  Marland Kitchen linagliptin (TRADJENTA) 5 MG TABS tablet Take 1 tablet (5 mg total) by mouth daily.  Marland Kitchen losartan (COZAAR) 50 MG tablet Take 1 tablet (50 mg total) by mouth daily.  . Multiple Vitamins-Minerals (ICAPS) CAPS Take by mouth 2 (two) times daily. Reported on 07/05/2015  . ONE TOUCH ULTRA TEST test strip USE TO TEST ONCE DAILY.  . pantoprazole (PROTONIX) 40 MG tablet Take 1 tablet (40 mg total) by mouth daily.  . SYMBICORT 160-4.5 MCG/ACT inhaler USE 2 PUFFS TWICE DAILY FOR ASTHMA. RINSE MOUTH AFTER USE.  . [DISCONTINUED] glipiZIDE (GLUCOTROL) 5 MG tablet TAKE 2 TABLETS BY MOUTH EACH MORNING AND 2 TABLETS AT SUPPER. (Patient taking differently: TAKE 1 TABLET BY MOUTH EACH MORNING AND 1TABLET AT SUPPER.)   No facility-administered encounter medications on file as of 11/09/2015.   ALLERGIES:  Allergies  Allergen Reactions  . Lisinopril Cough    cough   VACCINATION STATUS: Immunization History  Administered Date(s) Administered  . Influenza Split 03/07/2013  . Influenza,inj,Quad PF,36+ Mos 03/27/2014, 03/27/2015  . Pneumococcal Conjugate-13 05/18/2014    Diabetes She presents for her initial diabetic visit. She has type 2 diabetes mellitus. Onset time: She was diagnosed at approximate age of 82 years. Her disease course has been improving. There are no hypoglycemic associated symptoms. Pertinent negatives for hypoglycemia include no confusion, headaches, pallor or seizures. Associated symptoms include fatigue, polydipsia and polyuria. Pertinent negatives for diabetes include no chest pain and no polyphagia. There are no  hypoglycemic complications. Symptoms are improving. Risk factors for coronary artery disease include diabetes mellitus, dyslipidemia, hypertension, obesity and sedentary lifestyle. Her weight is decreasing steadily. She is following a generally unhealthy diet. Meal planning includes avoidance of concentrated sweets (She recently change her diet significantly avoiding processed and concentrated carbohydrates.). She has had a previous visit with a dietitian. She never participates in exercise. Home blood sugar record trend: Recently she started to monitor blood glucose regularly her average blood glucose for the last 7 days is 199. She has seen Jearld Fenton, CDE . Her breakfast blood glucose range is generally 140-180 mg/dl. Her dinner blood glucose range is generally 140-180 mg/dl. Her overall blood glucose range is 140-180 mg/dl. An ACE inhibitor/angiotensin II receptor blocker is being taken. Eye exam is current.  Hyperlipidemia This is a chronic problem. The current episode started more than 1 year ago. Pertinent negatives include no chest pain, myalgias or shortness of breath. Current antihyperlipidemic treatment includes statins. Risk factors for coronary artery disease include dyslipidemia, diabetes mellitus, hypertension, obesity and a sedentary lifestyle.  Hypertension This is a chronic problem. The current episode started more than 1 year ago. Pertinent negatives include no chest pain, headaches, palpitations or shortness of breath. Past treatments include angiotensin blockers.      Review of Systems  Constitutional: Positive for fatigue. Negative for fever, chills and unexpected weight change.  HENT: Negative for trouble swallowing and voice change.   Eyes: Negative for visual disturbance.  Respiratory: Negative for cough, shortness of breath and wheezing.   Cardiovascular: Negative for chest pain, palpitations and leg swelling.  Gastrointestinal: Negative for nausea, vomiting and diarrhea.   Endocrine: Positive for polydipsia and polyuria. Negative for cold intolerance, heat intolerance and polyphagia.  Musculoskeletal: Positive for back pain, arthralgias and gait problem. Negative for myalgias.  Skin: Negative for color change, pallor, rash and wound.  Neurological: Negative for seizures and headaches.  Psychiatric/Behavioral: Negative for suicidal ideas and confusion.    Objective:    BP 146/85 mmHg  Pulse 75  Ht '5\' 3"'$  (1.6 m)  Wt 174 lb (78.926 kg)  BMI 30.83 kg/m2  Wt Readings from Last 3 Encounters:  11/09/15 174 lb (78.926 kg)  10/02/15 178 lb 12.8 oz (81.103 kg)  09/20/15 179 lb 6.4 oz (81.375 kg)    Physical Exam  Constitutional: She is oriented to person, place, and time. She appears well-developed.  HENT:  Head: Normocephalic and atraumatic.  Eyes: EOM are normal.  Neck: Normal range of motion. Neck supple. No tracheal deviation present. No thyromegaly present.  Cardiovascular: Normal rate and regular rhythm.   Pulmonary/Chest: Effort normal and breath sounds normal.  Abdominal: Soft. Bowel sounds are normal. There is no tenderness. There is no guarding.  Musculoskeletal: Normal range of motion. She exhibits no edema.  Neurological: She is alert and oriented to person,  place, and time. She has normal reflexes. No cranial nerve deficit. Coordination normal.  Skin: Skin is warm and dry. No rash noted. No erythema. No pallor.  Psychiatric: She has a normal mood and affect. Judgment normal.    CMP     Component Value Date/Time   NA 146* 11/02/2015 0826   NA 143 08/17/2013 0846   K 4.7 11/02/2015 0826   CL 105 11/02/2015 0826   CO2 21 11/02/2015 0826   GLUCOSE 120* 11/02/2015 0826   GLUCOSE 134* 08/17/2013 0846   BUN 20 11/02/2015 0826   BUN 16 08/17/2013 0846   CREATININE 1.26* 11/02/2015 0826   CREATININE 1.05 08/17/2013 0846   CALCIUM 9.3 11/02/2015 0826   PROT 6.9 11/02/2015 0826   PROT 6.6 08/17/2013 0846   PROT 5.7 01/22/2012 1316    ALBUMIN 4.7 11/02/2015 0826   ALBUMIN 4.2 08/17/2013 0846   AST 19 11/02/2015 0826   AST 14 01/22/2012 1316   ALT 11 11/02/2015 0826   ALKPHOS 84 11/02/2015 0826   ALKPHOS 48 01/22/2012 1316   BILITOT 0.4 11/02/2015 0826   BILITOT 0.5 08/17/2013 0846   BILITOT 0.4 01/22/2012 1316   GFRNONAA 39* 11/02/2015 0826   GFRAA 45* 11/02/2015 0826    Lab Results  Component Value Date   HGBA1C 7.0* 11/02/2015   HGBA1C 9.5* 06/22/2015   HGBA1C 8.7 03/27/2015     Lipid Panel     Component Value Date/Time   CHOL 189 06/22/2015 0904   CHOL 261* 08/17/2013 0846   TRIG 182* 06/22/2015 0904   TRIG 177 09/04/2008   HDL 54 06/22/2015 0904   HDL 44 08/17/2013 0846   CHOLHDL 3.5 06/22/2015 0904   CHOLHDL 5.9 08/17/2013 0846   VLDL 42* 08/17/2013 0846   LDLCALC 99 06/22/2015 0904   LDLCALC 175* 08/17/2013 0846   LDLCALC 100 09/04/2008      Assessment & Plan:   1. Uncontrolled type 2 diabetes mellitus with stage 3 chronic kidney disease, without long-term current use of insulin ([place]) - Patient has currently uncontrolled symptomatic type 2 DM since   82 years of age,  with most recent A1c of 7% improving from 9.5 %. Recent labs reviewed.  -She does not report major complications, however patient remains at a high risk for more acute and chronic complications of diabetes which include CAD, CVA, CKD, retinopathy, and neuropathy. These are all discussed in detail with the patient.  - I have counseled the patient on diet management and weight loss, by adopting a carbohydrate restricted/protein rich diet.  - Suggestion is made for patient to avoid simple carbohydrates   from their diet including Cakes , Desserts, Ice Cream,  Soda (  diet and regular) , Sweet Tea , Candies,  Chips, Cookies, Artificial Sweeteners,   and "Sugar-free" Products . This will help patient to have stable blood glucose profile and potentially avoid unintended weight gain.  - I encouraged the patient to switch to   unprocessed or minimally processed complex starch and increased protein intake (animal or plant source), fruits, and vegetables.  - Patient is advised to stick to a routine mealtimes to eat 3 meals  a day and avoid unnecessary snacks ( to snack only to correct hypoglycemia).  - The patient will be scheduled with Jearld Fenton, RDN, CDE for individualized DM education.  - I have approached patient with the following individualized plan to manage diabetes and patient agrees:   - She came with better blood glucose profile averaging 142  over the last 7 days. -  She is at risk of hypoglycemia, not a good candidate to initiate insulin on. -For now, I will lower her glipizide to 5 mg by mouth only one time with breakfast. - I will continue Tradjenta 5 mg by mouth every morning with breakfast. I discussed side effects and precautions with her. -She is not a candidate for metformin nor SGLT2 inhibitors due to CKD.  - Patient specific target  A1c;  LDL, HDL, Triglycerides, and  Waist Circumference were discussed in detail.  2) BP/HTN: Controlled. Continue current medications including ACEI/ARB. 3) Lipids/HPL:  Controlled, continue statins. 4)  Weight/Diet: CDE Consult in progress , exercise, and detailed carbohydrates information provided.  5) Chronic Care/Health Maintenance:  -Patient is on ACEI/ARB and Statin medications and encouraged to continue to follow up with Ophthalmology, Podiatrist at least yearly or according to recommendations, and advised to   stay away from smoking. I have recommended yearly flu vaccine and pneumonia vaccination at least every 5 years; moderate intensity exercise for up to 150 minutes weekly; and  sleep for at least 7 hours a day.  - 10mnutes of time was spent on the care of this patient , 50% of which was applied for counseling on diabetes complications and their preventions.  - Patient to bring meter and  blood glucose logs during their next visit.   - I advised  patient to maintain close follow up with SSallee Lange MD for primary care needs.  Follow up plan: - Return in about 3 months (around 02/09/2016) for follow up with pre-visit labs, meter, and logs.  GGlade Lloyd MD Phone: [phone] Fax: [phone]  11/09/2015, 10:44 AM

## 2015-11-09 NOTE — Patient Instructions (Signed)
Goals: Keep up the good job!! 1 Follow Plate Method 2. Eat 2-3 carb choices per meal 4. Eat meals at times discussed B) 6-8 am L) 12-2 pm and D)5-7 pm. 6.  Keep A1C down to 7.5% or lower. 8. Test blood sugar in am AND before going to bed at time a few times per week.

## 2015-11-09 NOTE — Patient Instructions (Signed)

## 2015-11-09 NOTE — Progress Notes (Signed)
  Medical Nutrition Therapy:  Appt start time: 1100 end time:  1130.   Assessment:  Primary concerns today:  Diabetes. Type 2.  Follow up. A1C down to 7% from 9.5%. Saw Dr. Dorris Fetch . On Trajenta and now Glipizide reduced to 5 mg daily in am instead of twice a day. Testing BID.Marland Kitchen No low blood sugar.  FBS 99-125 mg/dl. Lost 9 lbs since last visit. Cut snacks, sweets. Has cut out diet sodas, juices, tea and junk food and snacks. Meter brought in. AVG bs 154 mg/dl for last 30 days. BS doing much better. Feels good. Gets hungry at times. LImited mobility due to back problems. Fixes a lot of meals at home. Diet is doing very well and fairly well balanced.  Preferred Learning Style:   No preference indicated   Learning Readiness:   Ready  Change in progress   MEDICATIONS: See lis   DIETARY INTAKE:  24-hr recall:  B ( AM): Cherrios, OR eggs and bacon,  Diet coke, or crystal light, coffee  Snk ( AM):   L ( PM): Meat, 2 vegetables and frruit, water Snk ( PM):  D ( PM):  Meat, 2 vegetables, fruit, water Snk ( PM):  Beverages: water Usual physical activity: ADL  Estimated energy needs: 1200-1500 calories 170 g carbohydrates 112 g protein 42 g fat  Progress Towards Goal(s):  In progress.   Nutritional Diagnosis:  NB-1.1 Food and nutrition-related knowledge deficit As related to Diabetes .  As evidenced by A1C 9.5%..    Intervention:  Nutrition and Diabetes education provided on My Plate, CHO counting, meal planning, portion sizes, timing of meals, avoiding snacks between meals unless having a low blood sugar, target ranges for A1C and blood sugars, signs/symptoms and treatment of hyper/hypoglycemia, monitoring blood sugars, taking medications as prescribed, benefits of exercising 30 minutes per day and prevention of complications of DM.  Goals: Keep up the good job!! 1 Follow Plate Method 2. Eat 2-3 carb choices per meal 4. Eat meals at times discussed B) 6-8 am L) 12-2 pm and D)5-7  pm. 6.  Keep A1C down to 7.5% or lower. 8. Test blood sugar in am AND before going to bed at time a few times per week.    Teaching Method Utilized:  Auditory Hands on  Handouts given during visit include: The Plate Method  Meal Plan Card Diabetes Instructions   Barriers to learning/adherence to lifestyle change:  Demonstrated degree of understanding via:  Teach Back   Monitoring/Evaluation:  Dietary intake, exercise, meal planning, SBG, and body weight in PRN.  She is doing well. If needed in the future, please call.

## 2015-11-26 ENCOUNTER — Other Ambulatory Visit: Payer: Self-pay | Admitting: Family Medicine

## 2015-11-26 ENCOUNTER — Other Ambulatory Visit: Payer: Self-pay | Admitting: "Endocrinology

## 2015-11-30 DIAGNOSIS — H401122 Primary open-angle glaucoma, left eye, moderate stage: Secondary | ICD-10-CM | POA: Diagnosis not present

## 2015-11-30 DIAGNOSIS — H401111 Primary open-angle glaucoma, right eye, mild stage: Secondary | ICD-10-CM | POA: Diagnosis not present

## 2015-12-31 ENCOUNTER — Other Ambulatory Visit: Payer: Self-pay | Admitting: Family Medicine

## 2016-01-01 DIAGNOSIS — H353122 Nonexudative age-related macular degeneration, left eye, intermediate dry stage: Secondary | ICD-10-CM | POA: Diagnosis not present

## 2016-01-01 DIAGNOSIS — H353211 Exudative age-related macular degeneration, right eye, with active choroidal neovascularization: Secondary | ICD-10-CM | POA: Diagnosis not present

## 2016-01-24 ENCOUNTER — Encounter: Payer: Self-pay | Admitting: Family Medicine

## 2016-01-24 ENCOUNTER — Ambulatory Visit (INDEPENDENT_AMBULATORY_CARE_PROVIDER_SITE_OTHER): Payer: PPO | Admitting: Family Medicine

## 2016-01-24 VITALS — BP 120/78 | Ht 63.0 in | Wt 172.6 lb

## 2016-01-24 DIAGNOSIS — N183 Chronic kidney disease, stage 3 (moderate): Secondary | ICD-10-CM

## 2016-01-24 DIAGNOSIS — R1084 Generalized abdominal pain: Secondary | ICD-10-CM | POA: Diagnosis not present

## 2016-01-24 DIAGNOSIS — I10 Essential (primary) hypertension: Secondary | ICD-10-CM | POA: Diagnosis not present

## 2016-01-24 DIAGNOSIS — Z23 Encounter for immunization: Secondary | ICD-10-CM

## 2016-01-24 DIAGNOSIS — E1165 Type 2 diabetes mellitus with hyperglycemia: Secondary | ICD-10-CM

## 2016-01-24 DIAGNOSIS — E1122 Type 2 diabetes mellitus with diabetic chronic kidney disease: Secondary | ICD-10-CM | POA: Diagnosis not present

## 2016-01-24 DIAGNOSIS — IMO0002 Reserved for concepts with insufficient information to code with codable children: Secondary | ICD-10-CM

## 2016-01-24 NOTE — Progress Notes (Signed)
   Subjective:    Patient ID: Victoria Vasquez, female    DOB: 1927/08/22, 82 y.o.   MRN: [id number]  Hypertension  This is a chronic problem. The current episode started more than 1 year ago. Risk factors for coronary artery disease include dyslipidemia, diabetes mellitus, sedentary lifestyle and post-menopausal state. Treatments tried: norvasc, losartin. There are no compliance problems.    Patient relates intermittent abdominal discomfort denies high fever chills sweats denies nausea vomiting diarrhea. States epigastric pain does not radiate into her back or into her sides does not get severe but comes and goes she wonders if it could be related to her new diabetic medicine Patient having leg pain and wonders if she can stop her lipitor-have a lot of aches and pains in her legs she wonders if it's due to Lipitor we talked about this it certainly could be related to that but it could also be related to age  Review of Systems  Constitutional: Negative for activity change and appetite change.  HENT: Negative for congestion.   Gastrointestinal: Positive for abdominal pain. Negative for nausea and vomiting.  Neurological: Negative for weakness.  Psychiatric/Behavioral: Negative for confusion.       Objective:   Physical Exam  Constitutional: She appears well-nourished. No distress.  HENT:  Head: Normocephalic.  Cardiovascular: Normal rate, regular rhythm and normal heart sounds.   No murmur heard. Pulmonary/Chest: Effort normal and breath sounds normal.  Abdominal: Soft. There is tenderness.  Musculoskeletal: She exhibits no edema.  Lymphadenopathy:    She has no cervical adenopathy.  Neurological: She is alert.  Psychiatric: Her behavior is normal.  Vitals reviewed.   Diabetic foot exam completed mild neuropathy in the toes      Assessment & Plan:  Hypertension decent control continue current measures  Diabetes with renal disease overall doing well with the plan that Dr. Dorris Fetch  has going for her  Gen. abdominal pain I doubt that this epigastric discomfort that comes and goes is related to her diabetic medicine but it could be some therefore check lipase liver and CBC. Await the results of this.  Pneumonia vaccine  Per patient request stop Lipitor on follow-up see how she is doing in 4 weeks consider restarting Lipitor if of leg pain hasn't changed any

## 2016-01-25 LAB — CBC WITH DIFFERENTIAL/PLATELET
Basophils Absolute: 0 10*3/uL (ref 0.0–0.2)
Basos: 0 %
EOS (ABSOLUTE): 0.2 10*3/uL (ref 0.0–0.4)
EOS: 4 %
HEMATOCRIT: 39.2 % (ref 34.0–46.6)
HEMOGLOBIN: 12.9 g/dL (ref 11.1–15.9)
Immature Grans (Abs): 0 10*3/uL (ref 0.0–0.1)
Immature Granulocytes: 0 %
LYMPHS ABS: 1.8 10*3/uL (ref 0.7–3.1)
Lymphs: 32 %
MCH: 29.8 pg (ref 26.6–33.0)
MCHC: 32.9 g/dL (ref 31.5–35.7)
MCV: 91 fL (ref 79–97)
MONOCYTES: 12 %
Monocytes Absolute: 0.7 10*3/uL (ref 0.1–0.9)
Neutrophils Absolute: 2.9 10*3/uL (ref 1.4–7.0)
Neutrophils: 52 %
Platelets: 190 10*3/uL (ref 150–379)
RBC: 4.33 x10E6/uL (ref 3.77–5.28)
RDW: 13.9 % (ref 12.3–15.4)
WBC: 5.6 10*3/uL (ref 3.4–10.8)

## 2016-01-25 LAB — HEPATIC FUNCTION PANEL
ALBUMIN: 4.6 g/dL (ref 3.5–4.7)
ALK PHOS: 73 IU/L (ref 39–117)
ALT: 12 IU/L (ref 0–32)
AST: 19 IU/L (ref 0–40)
BILIRUBIN TOTAL: 0.5 mg/dL (ref 0.0–1.2)
Bilirubin, Direct: 0.15 mg/dL (ref 0.00–0.40)
Total Protein: 7 g/dL (ref 6.0–8.5)

## 2016-01-25 LAB — LIPASE: Lipase: 47 U/L (ref 0–59)

## 2016-01-26 ENCOUNTER — Other Ambulatory Visit: Payer: Self-pay | Admitting: Family Medicine

## 2016-02-01 ENCOUNTER — Ambulatory Visit: Payer: PPO | Admitting: Family Medicine

## 2016-02-07 DIAGNOSIS — E1122 Type 2 diabetes mellitus with diabetic chronic kidney disease: Secondary | ICD-10-CM | POA: Diagnosis not present

## 2016-02-07 DIAGNOSIS — N183 Chronic kidney disease, stage 3 (moderate): Secondary | ICD-10-CM | POA: Diagnosis not present

## 2016-02-07 DIAGNOSIS — E1165 Type 2 diabetes mellitus with hyperglycemia: Secondary | ICD-10-CM | POA: Diagnosis not present

## 2016-02-08 LAB — CMP14+EGFR
ALBUMIN: 4.7 g/dL (ref 3.5–4.7)
ALK PHOS: 72 IU/L (ref 39–117)
ALT: 12 IU/L (ref 0–32)
AST: 16 IU/L (ref 0–40)
Albumin/Globulin Ratio: 2.1 (ref 1.2–2.2)
BILIRUBIN TOTAL: 0.5 mg/dL (ref 0.0–1.2)
BUN / CREAT RATIO: 17 (ref 12–28)
BUN: 20 mg/dL (ref 8–27)
CHLORIDE: 104 mmol/L (ref 96–106)
CO2: 25 mmol/L (ref 18–29)
Calcium: 9.4 mg/dL (ref 8.7–10.3)
Creatinine, Ser: 1.2 mg/dL — ABNORMAL HIGH (ref 0.57–1.00)
GFR calc Af Amer: 48 mL/min/{1.73_m2} — ABNORMAL LOW (ref >59)
GFR calc non Af Amer: 41 mL/min/{1.73_m2} — ABNORMAL LOW (ref >59)
GLUCOSE: 129 mg/dL — AB (ref 65–99)
Globulin, Total: 2.2 g/dL (ref 1.5–4.5)
POTASSIUM: 4.8 mmol/L (ref 3.5–5.2)
Sodium: 142 mmol/L (ref 134–144)
Total Protein: 6.9 g/dL (ref 6.0–8.5)

## 2016-02-08 LAB — HEMOGLOBIN A1C
Est. average glucose Bld gHb Est-mCnc: 140 mg/dL
HEMOGLOBIN A1C: 6.5 % — AB (ref 4.8–5.6)

## 2016-02-15 ENCOUNTER — Ambulatory Visit: Payer: PPO | Admitting: "Endocrinology

## 2016-02-15 ENCOUNTER — Encounter: Payer: Self-pay | Admitting: "Endocrinology

## 2016-02-15 ENCOUNTER — Ambulatory Visit (INDEPENDENT_AMBULATORY_CARE_PROVIDER_SITE_OTHER): Payer: PPO | Admitting: "Endocrinology

## 2016-02-15 VITALS — BP 152/88 | HR 65 | Ht 63.0 in | Wt 172.0 lb

## 2016-02-15 DIAGNOSIS — E785 Hyperlipidemia, unspecified: Secondary | ICD-10-CM

## 2016-02-15 DIAGNOSIS — E1165 Type 2 diabetes mellitus with hyperglycemia: Secondary | ICD-10-CM

## 2016-02-15 DIAGNOSIS — I10 Essential (primary) hypertension: Secondary | ICD-10-CM | POA: Diagnosis not present

## 2016-02-15 DIAGNOSIS — E1122 Type 2 diabetes mellitus with diabetic chronic kidney disease: Secondary | ICD-10-CM

## 2016-02-15 DIAGNOSIS — IMO0002 Reserved for concepts with insufficient information to code with codable children: Secondary | ICD-10-CM

## 2016-02-15 DIAGNOSIS — N183 Chronic kidney disease, stage 3 (moderate): Secondary | ICD-10-CM | POA: Diagnosis not present

## 2016-02-15 NOTE — Patient Instructions (Signed)

## 2016-02-15 NOTE — Progress Notes (Signed)
Subjective:    Patient ID: Victoria Vasquez, female    DOB: 1927-06-17. Patient is being seen in consultation for management of diabetes requested by  Sallee Lange, MD  Past Medical History:  Diagnosis Date  . Asthma   . Chest pain    Evaluation prior to 2003 reportedly including cath-records never located; Echo in 2009: Mild LVH, LAE2, nl EF, moderate LAE, MR1-2  . Chronic kidney disease   . COPD (chronic obstructive pulmonary disease) (HCC)    Exertional dyspnea; PFTs in 2002: Moderate small airway obstruction, mildly reduced lung volumes and DLCO, nl ABG  . Diabetes mellitus, type II ([place])   . Fatigue   . Hyperlipidemia   . Hypertension   . Legally blind 1 1 2015  . Macular degeneration of both eyes 1 1 2015  . Parotid mass 2003   Right-2003  . Scoliosis   . Shingles    Past Surgical History:  Procedure Laterality Date  . APPENDECTOMY  1951  . CHOLECYSTECTOMY  2003  . COLONOSCOPY  04/16/2006   [place]-Normal colon.  Exam limited due to retained particulate matter in the lumen of the colon, polyps less than 1 cm would have been easily missed.  Otherwise, no polyps, masses, inflammatory changes or vascular ectasia seen/ Normal retroflexed view of the rectum..  recommend repeat exam at 03/2011  . ORIF  1996  . ORIF ANKLE FRACTURE  1992   Social History   Social History  . Marital status: Widowed    Spouse name: N/A  . Number of children: 3  . Years of education: N/A   Occupational History  . Retired    Social History Main Topics  . Smoking status: Passive Smoke Exposure - Never Smoker  . Smokeless tobacco: Never Used  . Alcohol use No  . Drug use: No  . Sexual activity: Not Asked   Other Topics Concern  . None   Social History Narrative   Widowed   3 children   No regular exercise   Outpatient Encounter Prescriptions as of 02/15/2016  Medication Sig  . amLODipine (NORVASC) 2.5 MG tablet TAKE ONE TABLET DAILY FOR BLOOD PRESSURE.  Marland Kitchen aspirin 81 MG tablet Take  81 mg by mouth daily.  Marland Kitchen glipiZIDE (GLUCOTROL) 5 MG tablet Take 5 mg by mouth daily with breakfast.  . glucose blood (ONE TOUCH TEST STRIPS) test strip Use as instructed to test blood glucose 4 times a day  . latanoprost (XALATAN) 0.005 % ophthalmic solution Place 1 drop into the right eye at bedtime.  Marland Kitchen losartan (COZAAR) 50 MG tablet TAKE ONE TABLET BY MOUTH DAILY.  . Multiple Vitamins-Minerals (ICAPS) CAPS Take by mouth 2 (two) times daily. Reported on 07/05/2015  . ONE TOUCH ULTRA TEST test strip USE TO TEST ONCE DAILY.  . pantoprazole (PROTONIX) 40 MG tablet TAKE 1 TABLET BY MOUTH ONCE DAILY.  . SYMBICORT 160-4.5 MCG/ACT inhaler USE 2 PUFFS TWICE DAILY FOR ASTHMA. RINSE MOUTH AFTER USE.  . TRADJENTA 5 MG TABS tablet TAKE 1 TABLET BY MOUTH ONCE A DAY.   No facility-administered encounter medications on file as of 02/15/2016.    ALLERGIES: Allergies  Allergen Reactions  . Lisinopril Cough    cough   VACCINATION STATUS: Immunization History  Administered Date(s) Administered  . Influenza Split 03/07/2013  . Influenza,inj,Quad PF,36+ Mos 03/27/2014, 03/27/2015, 01/24/2016  . Pneumococcal Conjugate-13 05/18/2014  . Pneumococcal Polysaccharide-23 01/24/2016    Diabetes  She presents for her initial diabetic visit. She has  type 2 diabetes mellitus. Onset time: She was diagnosed at approximate age of 28 years. Her disease course has been improving. There are no hypoglycemic associated symptoms. Pertinent negatives for hypoglycemia include no confusion, headaches, pallor or seizures. Associated symptoms include fatigue, polydipsia and polyuria. Pertinent negatives for diabetes include no chest pain and no polyphagia. There are no hypoglycemic complications. Symptoms are improving. Risk factors for coronary artery disease include diabetes mellitus, dyslipidemia, hypertension, obesity and sedentary lifestyle. Her weight is decreasing steadily. She is following a generally unhealthy diet. Meal  planning includes avoidance of concentrated sweets (She recently change her diet significantly avoiding processed and concentrated carbohydrates.). She has had a previous visit with a dietitian. She never participates in exercise. Home blood sugar record trend: Recently she started to monitor blood glucose regularly her average blood glucose for the last 7 days is 199. She has seen Jearld Fenton, CDE . Her breakfast blood glucose range is generally 140-180 mg/dl. Her dinner blood glucose range is generally 140-180 mg/dl. Her overall blood glucose range is 140-180 mg/dl. An ACE inhibitor/angiotensin II receptor blocker is being taken. Eye exam is current.  Hyperlipidemia  This is a chronic problem. The current episode started more than 1 year ago. Pertinent negatives include no chest pain, myalgias or shortness of breath. Current antihyperlipidemic treatment includes statins. Risk factors for coronary artery disease include dyslipidemia, diabetes mellitus, hypertension, obesity and a sedentary lifestyle.  Hypertension  This is a chronic problem. The current episode started more than 1 year ago. Pertinent negatives include no chest pain, headaches, palpitations or shortness of breath. Past treatments include angiotensin blockers.      Review of Systems  Constitutional: Positive for fatigue. Negative for chills, fever and unexpected weight change.  HENT: Negative for trouble swallowing and voice change.   Eyes: Negative for visual disturbance.  Respiratory: Negative for cough, shortness of breath and wheezing.   Cardiovascular: Negative for chest pain, palpitations and leg swelling.  Gastrointestinal: Negative for diarrhea, nausea and vomiting.  Endocrine: Positive for polydipsia and polyuria. Negative for cold intolerance, heat intolerance and polyphagia.  Musculoskeletal: Positive for arthralgias, back pain and gait problem. Negative for myalgias.  Skin: Negative for color change, pallor, rash and  wound.  Neurological: Negative for seizures and headaches.  Psychiatric/Behavioral: Negative for confusion and suicidal ideas.    Objective:    BP (!) 152/88   Pulse 65   Ht '5\' 3"'$  (1.6 m)   Wt 172 lb (78 kg)   BMI 30.47 kg/m   Wt Readings from Last 3 Encounters:  02/15/16 172 lb (78 kg)  01/24/16 172 lb 9.6 oz (78.3 kg)  11/09/15 174 lb (78.9 kg)    Physical Exam  Constitutional: She is oriented to person, place, and time. She appears well-developed.  HENT:  Head: Normocephalic and atraumatic.  Eyes: EOM are normal.  Neck: Normal range of motion. Neck supple. No tracheal deviation present. No thyromegaly present.  Cardiovascular: Normal rate and regular rhythm.   Pulmonary/Chest: Effort normal and breath sounds normal.  Abdominal: Soft. Bowel sounds are normal. There is no tenderness. There is no guarding.  Musculoskeletal: Normal range of motion. She exhibits no edema.  Neurological: She is alert and oriented to person, place, and time. She has normal reflexes. No cranial nerve deficit. Coordination normal.  Skin: Skin is warm and dry. No rash noted. No erythema. No pallor.  Psychiatric: She has a normal mood and affect. Judgment normal.    CMP     Component  Value Date/Time   NA 142 02/07/2016 0814   K 4.8 02/07/2016 0814   CL 104 02/07/2016 0814   CO2 25 02/07/2016 0814   GLUCOSE 129 (H) 02/07/2016 0814   GLUCOSE 134 (H) 08/17/2013 0846   BUN 20 02/07/2016 0814   CREATININE 1.20 (H) 02/07/2016 0814   CREATININE 1.05 08/17/2013 0846   CALCIUM 9.4 02/07/2016 0814   PROT 6.9 02/07/2016 0814   PROT 5.7 01/22/2012 1316   ALBUMIN 4.7 02/07/2016 0814   AST 16 02/07/2016 0814   AST 14 01/22/2012 1316   ALT 12 02/07/2016 0814   ALKPHOS 72 02/07/2016 0814   ALKPHOS 48 01/22/2012 1316   BILITOT 0.5 02/07/2016 0814   BILITOT 0.4 01/22/2012 1316   GFRNONAA 41 (L) 02/07/2016 0814   GFRAA 48 (L) 02/07/2016 0814    Lab Results  Component Value Date   HGBA1C 6.5 (H)  02/07/2016   HGBA1C 7.0 (H) 11/02/2015   HGBA1C 9.5 (H) 06/22/2015     Lipid Panel     Component Value Date/Time   CHOL 189 06/22/2015 0904   TRIG 182 (H) 06/22/2015 0904   TRIG 177 09/04/2008   HDL 54 06/22/2015 0904   CHOLHDL 3.5 06/22/2015 0904   CHOLHDL 5.9 08/17/2013 0846   VLDL 42 (H) 08/17/2013 0846   LDLCALC 99 06/22/2015 0904   LDLCALC 100 09/04/2008      Assessment & Plan:   1. Uncontrolled type 2 diabetes mellitus with stage 3 chronic kidney disease - Patient has currently uncontrolled symptomatic type 2 DM since   82 years of age,  with most recent A1c of 6.5%, Generally improving from 9.5 %. Recent labs reviewed.  - patient remains at a high risk for more acute and chronic complications of diabetes which include CAD, CVA, CKD, retinopathy, and neuropathy. These are all discussed in detail with the patient.  - I have counseled the patient on diet management and weight loss, by adopting a carbohydrate restricted/protein rich diet.  - Suggestion is made for patient to avoid simple carbohydrates   from their diet including Cakes , Desserts, Ice Cream,  Soda (  diet and regular) , Sweet Tea , Candies,  Chips, Cookies, Artificial Sweeteners,   and "Sugar-free" Products . This will help patient to have stable blood glucose profile and potentially avoid unintended weight gain.  - I encouraged the patient to switch to  unprocessed or minimally processed complex starch and increased protein intake (animal or plant source), fruits, and vegetables.  - Patient is advised to stick to a routine mealtimes to eat 3 meals  a day and avoid unnecessary snacks ( to snack only to correct hypoglycemia).  - The patient will be scheduled with Jearld Fenton, RDN, CDE for individualized DM education.  - I have approached patient with the following individualized plan to manage diabetes and patient agrees:   - She came with better blood glucose profile averaging 140 over the last 7 days. -   She is at risk of hypoglycemia, not a good candidate to initiate insulin on. -For now, I will continue on glipizide  5 mg by mouth only one time with breakfast. - I will continue Tradjenta 5 mg by mouth every morning with breakfast. I discussed side effects and precautions with her. - If she maintains A1c close to 6%, she will be taken off of glipizide. -She is not a candidate for metformin nor SGLT2 inhibitors due to CKD.  - Patient specific target  A1c;  LDL, HDL,  Triglycerides, and  Waist Circumference were discussed in detail.  2) BP/HTN: Controlled. Continue current medications including ACEI/ARB. 3) Lipids/HPL:  Controlled, continue statins. 4)  Weight/Diet: CDE Consult in progress , exercise, and detailed carbohydrates information provided.  5) Chronic Care/Health Maintenance:  -Patient is on ACEI/ARB and Statin medications and encouraged to continue to follow up with Ophthalmology, Podiatrist at least yearly or according to recommendations, and advised to   stay away from smoking. I have recommended yearly flu vaccine and pneumonia vaccination at least every 5 years; moderate intensity exercise for up to 150 minutes weekly; and  sleep for at least 7 hours a day.  - 25  minutes of time was spent on the care of this patient , 50% of which was applied for counseling on diabetes complications and their preventions.  - Patient to bring meter and  blood glucose logs during their next visit.   - I advised patient to maintain close follow up with Sallee Lange, MD for primary care needs.  Follow up plan: - Return in about 3 months (around 05/16/2016) for follow up with pre-visit labs, meter, and logs.  Glade Lloyd, MD Phone: [phone]  Fax: [phone]   02/15/2016, 9:44 AM

## 2016-02-21 ENCOUNTER — Ambulatory Visit (INDEPENDENT_AMBULATORY_CARE_PROVIDER_SITE_OTHER): Payer: PPO | Admitting: Family Medicine

## 2016-02-21 ENCOUNTER — Ambulatory Visit: Payer: PPO | Admitting: Family Medicine

## 2016-02-21 ENCOUNTER — Encounter: Payer: Self-pay | Admitting: Family Medicine

## 2016-02-21 VITALS — BP 122/84 | Ht 63.0 in | Wt 171.6 lb

## 2016-02-21 DIAGNOSIS — E784 Other hyperlipidemia: Secondary | ICD-10-CM

## 2016-02-21 DIAGNOSIS — E7849 Other hyperlipidemia: Secondary | ICD-10-CM

## 2016-02-21 DIAGNOSIS — I10 Essential (primary) hypertension: Secondary | ICD-10-CM | POA: Diagnosis not present

## 2016-02-21 NOTE — Progress Notes (Signed)
   Subjective:    Patient ID: Victoria Vasquez, female    DOB: Jan 01, 1928, 82 y.o.   MRN: [id number]  HPI Patient arrives for a follow up on blood pressure and to see how she is doing after stopping the Lipitor. Patient states it helped a little with body aches. The patient stopped Lipitor on the last visit she relates that she is feeling a little bit better and does not one to start back on a statin She states her diabetes is been under very good control She denies any chest tightness pressure pain she is try to watch her diet try to stay active She does get woozy and dizzy when she stands up  Review of Systems See above. No vomiting diarrhea fever chills chest tightness or shortness of breath    Objective:   Physical Exam Lungs clear heart regular pulse normal BP is good extremities no edema skin warm dry Patient's blood pressure proximally 128/70 sitting when she stood up 118/66      Assessment & Plan:  Hyperlipidemia per patient's request will stay off of statin Hypertension-we will stop amlodipine. Continue losartan. Follow-up 3-4 months  Diabetes under good control patient states it's a hardship to go back and forth to the diabetic doctor would like everything followed here at this point. We will notify Dr. Liliane Channel office.

## 2016-02-23 ENCOUNTER — Other Ambulatory Visit: Payer: Self-pay | Admitting: Family Medicine

## 2016-04-21 ENCOUNTER — Other Ambulatory Visit: Payer: Self-pay | Admitting: "Endocrinology

## 2016-05-22 ENCOUNTER — Ambulatory Visit: Payer: PPO | Admitting: "Endocrinology

## 2016-05-29 ENCOUNTER — Ambulatory Visit (INDEPENDENT_AMBULATORY_CARE_PROVIDER_SITE_OTHER): Payer: PPO | Admitting: Family Medicine

## 2016-05-29 ENCOUNTER — Encounter: Payer: Self-pay | Admitting: Family Medicine

## 2016-05-29 VITALS — BP 122/82 | Ht 63.0 in | Wt 168.4 lb

## 2016-05-29 DIAGNOSIS — E7849 Other hyperlipidemia: Secondary | ICD-10-CM

## 2016-05-29 DIAGNOSIS — H3530 Unspecified macular degeneration: Secondary | ICD-10-CM | POA: Diagnosis not present

## 2016-05-29 DIAGNOSIS — H401111 Primary open-angle glaucoma, right eye, mild stage: Secondary | ICD-10-CM | POA: Diagnosis not present

## 2016-05-29 DIAGNOSIS — I10 Essential (primary) hypertension: Secondary | ICD-10-CM

## 2016-05-29 DIAGNOSIS — K219 Gastro-esophageal reflux disease without esophagitis: Secondary | ICD-10-CM

## 2016-05-29 DIAGNOSIS — Z79899 Other long term (current) drug therapy: Secondary | ICD-10-CM

## 2016-05-29 DIAGNOSIS — E784 Other hyperlipidemia: Secondary | ICD-10-CM

## 2016-05-29 DIAGNOSIS — E119 Type 2 diabetes mellitus without complications: Secondary | ICD-10-CM

## 2016-05-29 DIAGNOSIS — M1711 Unilateral primary osteoarthritis, right knee: Secondary | ICD-10-CM

## 2016-05-29 DIAGNOSIS — H353212 Exudative age-related macular degeneration, right eye, with inactive choroidal neovascularization: Secondary | ICD-10-CM | POA: Diagnosis not present

## 2016-05-29 DIAGNOSIS — H401131 Primary open-angle glaucoma, bilateral, mild stage: Secondary | ICD-10-CM | POA: Diagnosis not present

## 2016-05-29 LAB — POCT GLYCOSYLATED HEMOGLOBIN (HGB A1C): HEMOGLOBIN A1C: 5.7

## 2016-05-29 NOTE — Patient Instructions (Signed)
Don't use Aleve  Please use Tylenol for your knee  Recheck here in 4 months  Reduce the glipizide- one in the am and 1/2 at supper  If low sugar spells please call

## 2016-05-29 NOTE — Progress Notes (Signed)
   Subjective:    Patient ID: Victoria Vasquez, female    DOB: 1927-06-28, 83 y.o.   MRN: [id number]  Hypertension  This is a chronic problem. The current episode started more than 1 year ago. The problem has been gradually improving since onset. There are no associated agents to hypertension. There are no known risk factors for coronary artery disease. Treatments tried: losartan. The current treatment provides moderate improvement. There are no compliance problems.    Patient has stopped taking Tradjenta. Removed off med list. Patient states Dr. Nicki Reaper told her she does not have to see Dr. Dorris Fetch anymore. She states her sugars for the most part been good staying near 100 and morning and on 110 later in the day she denies any excessive thirst urination denies blurry vision.  Patient is having right knee pain. Onset 2 months ago.  Uses Aleve is intermittently. Seems to help her son  Patient denies any low sugar spells.  Patient does get a fair amount of regurgitation but denies heartburn with it takes her medicine on a regular basis  She denies any cardiac symptoms or stroke symptoms and takes 81 mg aspirins without side effects  She also has macular degeneration her daughter helps her out some but otherwise everything is on her  Review of Systems She denies chest pain shortness of breath vomiting diarrhea excessive thirst diminished vision is consistent with macular degeneration right knee pain and discomfort consistent with arthritis    Objective:   Physical Exam Has significant kyphosis lungs are clear no crackles heart is regular pulses normal extremities no edema skin warm dry neurologic gross normal diabetic foot exam normal       Assessment & Plan:  Diabetes-A1c exceptionally good reduce glipizide to a half a tablet in the evening stick with a whole tablet in the morning if any low sugar spells she is to let us know  Hyperlipidemia previous labs reviewed [place] continue current  regimen  Hypertension good control continue current measures  COPD stable continue Symbicort  Reflux stable continue acid blocker as directed  Macular degeneration follow through with specialist  Osteoarthritis of anemia recommend Tylenol not Aleve.  Patient will do her lab work she will follow-up here in a proximally 4 months to recheck A1c patient states that she is not following through with diabetic doctor any longer

## 2016-06-12 DIAGNOSIS — H353211 Exudative age-related macular degeneration, right eye, with active choroidal neovascularization: Secondary | ICD-10-CM | POA: Diagnosis not present

## 2016-06-12 DIAGNOSIS — H35423 Microcystoid degeneration of retina, bilateral: Secondary | ICD-10-CM | POA: Diagnosis not present

## 2016-06-12 DIAGNOSIS — H43813 Vitreous degeneration, bilateral: Secondary | ICD-10-CM | POA: Diagnosis not present

## 2016-06-12 DIAGNOSIS — H353124 Nonexudative age-related macular degeneration, left eye, advanced atrophic with subfoveal involvement: Secondary | ICD-10-CM | POA: Diagnosis not present

## 2016-06-18 DIAGNOSIS — Z79899 Other long term (current) drug therapy: Secondary | ICD-10-CM | POA: Diagnosis not present

## 2016-06-18 DIAGNOSIS — E784 Other hyperlipidemia: Secondary | ICD-10-CM | POA: Diagnosis not present

## 2016-06-19 ENCOUNTER — Encounter: Payer: Self-pay | Admitting: Family Medicine

## 2016-06-19 DIAGNOSIS — H353211 Exudative age-related macular degeneration, right eye, with active choroidal neovascularization: Secondary | ICD-10-CM | POA: Diagnosis not present

## 2016-06-19 LAB — BASIC METABOLIC PANEL
BUN/Creatinine Ratio: 18 (ref 12–28)
BUN: 21 mg/dL (ref 8–27)
CALCIUM: 9.2 mg/dL (ref 8.7–10.3)
CHLORIDE: 102 mmol/L (ref 96–106)
CO2: 24 mmol/L (ref 18–29)
Creatinine, Ser: 1.2 mg/dL — ABNORMAL HIGH (ref 0.57–1.00)
GFR calc Af Amer: 47 mL/min/{1.73_m2} — ABNORMAL LOW (ref >59)
GFR calc non Af Amer: 41 mL/min/{1.73_m2} — ABNORMAL LOW (ref >59)
GLUCOSE: 114 mg/dL — AB (ref 65–99)
POTASSIUM: 4.4 mmol/L (ref 3.5–5.2)
Sodium: 143 mmol/L (ref 134–144)

## 2016-06-19 LAB — HEPATIC FUNCTION PANEL
ALT: 9 IU/L (ref 0–32)
AST: 18 IU/L (ref 0–40)
Albumin: 4.3 g/dL (ref 3.5–4.7)
Alkaline Phosphatase: 81 IU/L (ref 39–117)
BILIRUBIN TOTAL: 0.5 mg/dL (ref 0.0–1.2)
Bilirubin, Direct: 0.11 mg/dL (ref 0.00–0.40)
Total Protein: 6.6 g/dL (ref 6.0–8.5)

## 2016-06-19 LAB — LIPID PANEL
CHOL/HDL RATIO: 6.4 ratio — AB (ref 0.0–4.4)
Cholesterol, Total: 267 mg/dL — ABNORMAL HIGH (ref 100–199)
HDL: 42 mg/dL (ref >39)
LDL CALC: 189 mg/dL — AB (ref 0–99)
TRIGLYCERIDES: 182 mg/dL — AB (ref 0–149)
VLDL CHOLESTEROL CAL: 36 mg/dL (ref 5–40)

## 2016-06-26 ENCOUNTER — Ambulatory Visit: Payer: PPO | Admitting: Family Medicine

## 2016-07-08 ENCOUNTER — Other Ambulatory Visit: Payer: Self-pay | Admitting: Family Medicine

## 2016-07-24 DIAGNOSIS — H35031 Hypertensive retinopathy, right eye: Secondary | ICD-10-CM | POA: Diagnosis not present

## 2016-07-24 DIAGNOSIS — H353124 Nonexudative age-related macular degeneration, left eye, advanced atrophic with subfoveal involvement: Secondary | ICD-10-CM | POA: Diagnosis not present

## 2016-07-24 DIAGNOSIS — H43813 Vitreous degeneration, bilateral: Secondary | ICD-10-CM | POA: Diagnosis not present

## 2016-07-24 DIAGNOSIS — H353211 Exudative age-related macular degeneration, right eye, with active choroidal neovascularization: Secondary | ICD-10-CM | POA: Diagnosis not present

## 2016-07-31 ENCOUNTER — Other Ambulatory Visit: Payer: Self-pay | Admitting: Family Medicine

## 2016-08-25 ENCOUNTER — Other Ambulatory Visit: Payer: Self-pay | Admitting: Family Medicine

## 2016-09-04 DIAGNOSIS — H35423 Microcystoid degeneration of retina, bilateral: Secondary | ICD-10-CM | POA: Diagnosis not present

## 2016-09-04 DIAGNOSIS — H35033 Hypertensive retinopathy, bilateral: Secondary | ICD-10-CM | POA: Diagnosis not present

## 2016-09-04 DIAGNOSIS — H353124 Nonexudative age-related macular degeneration, left eye, advanced atrophic with subfoveal involvement: Secondary | ICD-10-CM | POA: Diagnosis not present

## 2016-09-04 DIAGNOSIS — H353211 Exudative age-related macular degeneration, right eye, with active choroidal neovascularization: Secondary | ICD-10-CM | POA: Diagnosis not present

## 2016-09-25 ENCOUNTER — Ambulatory Visit (INDEPENDENT_AMBULATORY_CARE_PROVIDER_SITE_OTHER): Payer: PPO | Admitting: Family Medicine

## 2016-09-25 ENCOUNTER — Ambulatory Visit (HOSPITAL_COMMUNITY)
Admission: RE | Admit: 2016-09-25 | Discharge: 2016-09-25 | Disposition: A | Discharge status: Home / Self Care | Payer: PPO | Source: Ambulatory Visit | Attending: Family Medicine | Admitting: Family Medicine

## 2016-09-25 ENCOUNTER — Encounter: Payer: Self-pay | Admitting: Family Medicine

## 2016-09-25 VITALS — BP 122/80 | Ht 63.0 in | Wt 170.8 lb

## 2016-09-25 DIAGNOSIS — M47816 Spondylosis without myelopathy or radiculopathy, lumbar region: Secondary | ICD-10-CM | POA: Insufficient documentation

## 2016-09-25 DIAGNOSIS — M2578 Osteophyte, vertebrae: Secondary | ICD-10-CM | POA: Diagnosis not present

## 2016-09-25 DIAGNOSIS — R05 Cough: Secondary | ICD-10-CM

## 2016-09-25 DIAGNOSIS — E119 Type 2 diabetes mellitus without complications: Secondary | ICD-10-CM

## 2016-09-25 DIAGNOSIS — M545 Low back pain, unspecified: Secondary | ICD-10-CM

## 2016-09-25 DIAGNOSIS — I951 Orthostatic hypotension: Secondary | ICD-10-CM | POA: Diagnosis not present

## 2016-09-25 DIAGNOSIS — R059 Cough, unspecified: Secondary | ICD-10-CM

## 2016-09-25 DIAGNOSIS — R918 Other nonspecific abnormal finding of lung field: Secondary | ICD-10-CM | POA: Diagnosis not present

## 2016-09-25 DIAGNOSIS — R079 Chest pain, unspecified: Secondary | ICD-10-CM | POA: Diagnosis not present

## 2016-09-25 DIAGNOSIS — K449 Diaphragmatic hernia without obstruction or gangrene: Secondary | ICD-10-CM | POA: Insufficient documentation

## 2016-09-25 DIAGNOSIS — M4186 Other forms of scoliosis, lumbar region: Secondary | ICD-10-CM | POA: Insufficient documentation

## 2016-09-25 LAB — POCT GLYCOSYLATED HEMOGLOBIN (HGB A1C): Hemoglobin A1C: 6.8

## 2016-09-25 MED ORDER — GLIPIZIDE 5 MG PO TABS: ORAL_TABLET | ORAL | 5 refills | Status: DC

## 2016-09-25 MED ORDER — LOSARTAN POTASSIUM 25 MG PO TABS: 25.0000 mg | ORAL_TABLET | Freq: Every day | ORAL | 1 refills | Status: DC

## 2016-09-25 NOTE — Progress Notes (Signed)
   Subjective:    Patient ID: Victoria Vasquez, female    DOB: 10-Jul-1927, 83 y.o.   MRN: [id number]  Diabetes  She presents for her follow-up diabetic visit. She has type 2 diabetes mellitus. Pertinent negatives for hypoglycemia include no confusion. Pertinent negatives for diabetes include no chest pain, no fatigue, no polydipsia, no polyphagia and no weakness. Risk factors for coronary artery disease include post-menopausal, sedentary lifestyle and diabetes mellitus. Current diabetic treatment includes oral agent (monotherapy). She is compliant with treatment all of the time. Her weight is stable. She is following a diabetic diet.  The patient relates low back pain she is concerned about the possibility of having problems with her kidneys but she denies any dysuria or urinary frequency she states whenever she stands more than 10-15 minutes she has back pain. Does not wake her up at night it does not radiate down the leg she suffers with severe kyphosis  She also relates mild orthostasis when she stands up she gets dizzy no passing out just feels weak and dizzy sometimes she has to sit down  She also relates coughing at night she stopped using her COPD medicine she denies shortness of breath  Review of Systems  Constitutional: Negative for activity change, appetite change and fatigue.  HENT: Negative for congestion.   Respiratory: Negative for cough.   Cardiovascular: Negative for chest pain.  Gastrointestinal: Negative for abdominal pain.  Endocrine: Negative for polydipsia and polyphagia.  Neurological: Negative for weakness.  Psychiatric/Behavioral: Negative for confusion.       Objective:   Physical Exam  Constitutional: She appears well-nourished. No distress.  Cardiovascular: Normal rate, regular rhythm and normal heart sounds.   No murmur heard. Pulmonary/Chest: Effort normal and breath sounds normal. No respiratory distress.  Musculoskeletal: She exhibits no edema.    Lymphadenopathy:    She has no cervical adenopathy.  Neurological: She is alert. She exhibits normal muscle tone.  Psychiatric: Her behavior is normal.  Vitals reviewed.  She denies any low sugar spells. She states she is not using the Symbicort much       Assessment & Plan:  Diabetes good control overall continue current measures Mild hypotensive aspect with standing reduce blood pressure medicine She does relate some weakness and fatigue She does relate low back pain and discomfort we will do x-rays Relates cough at nighttime we will do a chest x-ray

## 2016-09-25 NOTE — Patient Instructions (Signed)
I believe the fatigue is related to possibly your blood pressure medicine. Your blood pressure did drop some when you stood. Remember to reduce losartan-new dose 25 mg. Please do your x-rays. Follow-up in the fall.

## 2016-11-03 DIAGNOSIS — H35033 Hypertensive retinopathy, bilateral: Secondary | ICD-10-CM | POA: Diagnosis not present

## 2016-11-03 DIAGNOSIS — H353124 Nonexudative age-related macular degeneration, left eye, advanced atrophic with subfoveal involvement: Secondary | ICD-10-CM | POA: Diagnosis not present

## 2016-11-03 DIAGNOSIS — H353211 Exudative age-related macular degeneration, right eye, with active choroidal neovascularization: Secondary | ICD-10-CM | POA: Diagnosis not present

## 2016-11-03 DIAGNOSIS — H35423 Microcystoid degeneration of retina, bilateral: Secondary | ICD-10-CM | POA: Diagnosis not present

## 2016-11-22 ENCOUNTER — Other Ambulatory Visit: Payer: Self-pay | Admitting: Family Medicine

## 2016-11-26 DIAGNOSIS — H401111 Primary open-angle glaucoma, right eye, mild stage: Secondary | ICD-10-CM | POA: Diagnosis not present

## 2017-01-12 DIAGNOSIS — H353211 Exudative age-related macular degeneration, right eye, with active choroidal neovascularization: Secondary | ICD-10-CM | POA: Diagnosis not present

## 2017-01-12 DIAGNOSIS — H43813 Vitreous degeneration, bilateral: Secondary | ICD-10-CM | POA: Diagnosis not present

## 2017-01-12 DIAGNOSIS — H35423 Microcystoid degeneration of retina, bilateral: Secondary | ICD-10-CM | POA: Diagnosis not present

## 2017-01-12 DIAGNOSIS — H353124 Nonexudative age-related macular degeneration, left eye, advanced atrophic with subfoveal involvement: Secondary | ICD-10-CM | POA: Diagnosis not present

## 2017-02-12 ENCOUNTER — Encounter: Payer: Self-pay | Admitting: Family Medicine

## 2017-02-12 ENCOUNTER — Ambulatory Visit (INDEPENDENT_AMBULATORY_CARE_PROVIDER_SITE_OTHER): Payer: PPO | Admitting: Family Medicine

## 2017-02-12 VITALS — BP 130/80 | Ht 63.0 in | Wt 172.5 lb

## 2017-02-12 DIAGNOSIS — E1165 Type 2 diabetes mellitus with hyperglycemia: Secondary | ICD-10-CM | POA: Diagnosis not present

## 2017-02-12 DIAGNOSIS — N183 Chronic kidney disease, stage 3 (moderate): Secondary | ICD-10-CM | POA: Diagnosis not present

## 2017-02-12 DIAGNOSIS — Z23 Encounter for immunization: Secondary | ICD-10-CM

## 2017-02-12 DIAGNOSIS — IMO0002 Reserved for concepts with insufficient information to code with codable children: Secondary | ICD-10-CM

## 2017-02-12 DIAGNOSIS — K219 Gastro-esophageal reflux disease without esophagitis: Secondary | ICD-10-CM

## 2017-02-12 DIAGNOSIS — R1310 Dysphagia, unspecified: Secondary | ICD-10-CM

## 2017-02-12 DIAGNOSIS — E784 Other hyperlipidemia: Secondary | ICD-10-CM

## 2017-02-12 DIAGNOSIS — E7849 Other hyperlipidemia: Secondary | ICD-10-CM

## 2017-02-12 DIAGNOSIS — Z780 Asymptomatic menopausal state: Secondary | ICD-10-CM

## 2017-02-12 DIAGNOSIS — I10 Essential (primary) hypertension: Secondary | ICD-10-CM | POA: Diagnosis not present

## 2017-02-12 DIAGNOSIS — E1122 Type 2 diabetes mellitus with diabetic chronic kidney disease: Secondary | ICD-10-CM | POA: Diagnosis not present

## 2017-02-12 LAB — POCT GLYCOSYLATED HEMOGLOBIN (HGB A1C): Hemoglobin A1C: 6.9

## 2017-02-12 MED ORDER — BUDESONIDE-FORMOTEROL FUMARATE 160-4.5 MCG/ACT IN AERO: INHALATION_SPRAY | RESPIRATORY_TRACT | 5 refills | Status: DC

## 2017-02-12 MED ORDER — PANTOPRAZOLE SODIUM 40 MG PO TBEC: 40.0000 mg | DELAYED_RELEASE_TABLET | Freq: Every day | ORAL | 3 refills | Status: DC

## 2017-02-12 MED ORDER — LOSARTAN POTASSIUM 25 MG PO TABS: 25.0000 mg | ORAL_TABLET | Freq: Every day | ORAL | 1 refills | Status: DC

## 2017-02-12 MED ORDER — GLIPIZIDE 5 MG PO TABS: ORAL_TABLET | ORAL | 5 refills | Status: DC

## 2017-02-12 NOTE — Progress Notes (Signed)
   Subjective:    Patient ID: Victoria Vasquez, female    DOB: [birth year], 83 y.o.   MRN: [id number]  Diabetes  She presents for her follow-up diabetic visit. She has type 2 diabetes mellitus. Pertinent negatives for hypoglycemia include no confusion. Pertinent negatives for diabetes include no chest pain, no fatigue, no polydipsia, no polyphagia and no weakness. She has not had a previous visit with a dietitian. She does not see a podiatrist.Eye exam is current.  Patient does state intermittent low back pain She also states she usually walks with a cane but she tripped and fell at [employer] she relates burning in her knees but no severe pain Patient had a fall on yesterday.States she fell in a parking lot and scrapped her knees. Also has concerns of acid reflux  Results for orders placed or performed in visit on 02/12/17  POCT HgB A1C  Result Value Ref Range   Hemoglobin A1C 6.9   Patient feels that her diabetes under good control denies low sugar spells   Review of Systems  Constitutional: Negative for activity change, appetite change and fatigue.  HENT: Negative for congestion.   Respiratory: Negative for cough.   Cardiovascular: Negative for chest pain.  Gastrointestinal: Negative for abdominal pain.  Endocrine: Negative for polydipsia and polyphagia.  Skin: Negative for color change.  Neurological: Negative for weakness.  Psychiatric/Behavioral: Negative for confusion.  Her blood pressure under good control she watches salt intake she takes her medicine reflux issues but no severe burning but does get occasional dysphagia she does not want to go to GI currently she is due for bone density because of osteoporosis and she has hyperlipidemia for which she watches diaper cannot tolerate statins    Objective:   Physical Exam  Constitutional: She appears well-developed and well-nourished. No distress.  HENT:  Head: Normocephalic and atraumatic.  Eyes: Right eye exhibits no discharge.  Left eye exhibits no discharge.  Neck: No tracheal deviation present.  Cardiovascular: Normal rate, regular rhythm and normal heart sounds.   No murmur heard. Pulmonary/Chest: Effort normal and breath sounds normal. No respiratory distress. She has no wheezes. She has no rales.  Musculoskeletal: She exhibits no edema.  Lymphadenopathy:    She has no cervical adenopathy.  Neurological: She is alert. She exhibits normal muscle tone.  Skin: Skin is warm and dry. No erythema.  Psychiatric: Her behavior is normal.  Vitals reviewed.    25 minutes was spent with the patient. Greater than half the time was spent in discussion and answering questions and counseling regarding the issues that the patient came in for today.      Assessment & Plan:  Diabetes decent control continue current measures Blood pressure good control Orthostatics were negative Reflux under decent control Mild dysphagia warning signs discussed patient does not one ago see GI currently she will consider it Osteoporosis bone density ordered Hyperlipidemia cannot tolerate statins

## 2017-02-12 NOTE — Patient Instructions (Signed)
May use Melatonin near bedtimes needed for sleep try 3 mg

## 2017-02-16 DIAGNOSIS — E7849 Other hyperlipidemia: Secondary | ICD-10-CM | POA: Diagnosis not present

## 2017-02-16 DIAGNOSIS — I10 Essential (primary) hypertension: Secondary | ICD-10-CM | POA: Diagnosis not present

## 2017-02-17 LAB — BASIC METABOLIC PANEL
BUN / CREAT RATIO: 17 (ref 12–28)
BUN: 20 mg/dL (ref 8–27)
CO2: 25 mmol/L (ref 20–29)
Calcium: 8.7 mg/dL (ref 8.7–10.3)
Chloride: 106 mmol/L (ref 96–106)
Creatinine, Ser: 1.17 mg/dL — ABNORMAL HIGH (ref 0.57–1.00)
GFR calc non Af Amer: 42 mL/min/{1.73_m2} — ABNORMAL LOW (ref >59)
GFR, EST AFRICAN AMERICAN: 48 mL/min/{1.73_m2} — AB (ref >59)
Glucose: 126 mg/dL — ABNORMAL HIGH (ref 65–99)
Potassium: 4.2 mmol/L (ref 3.5–5.2)
Sodium: 144 mmol/L (ref 134–144)

## 2017-02-17 LAB — LIPID PANEL
Chol/HDL Ratio: 5.4 ratio — ABNORMAL HIGH (ref 0.0–4.4)
Cholesterol, Total: 222 mg/dL — ABNORMAL HIGH (ref 100–199)
HDL: 41 mg/dL (ref >39)
LDL CALC: 155 mg/dL — AB (ref 0–99)
Triglycerides: 130 mg/dL (ref 0–149)
VLDL CHOLESTEROL CAL: 26 mg/dL (ref 5–40)

## 2017-03-25 DIAGNOSIS — H25013 Cortical age-related cataract, bilateral: Secondary | ICD-10-CM | POA: Diagnosis not present

## 2017-03-25 DIAGNOSIS — H2513 Age-related nuclear cataract, bilateral: Secondary | ICD-10-CM | POA: Diagnosis not present

## 2017-03-25 DIAGNOSIS — H401111 Primary open-angle glaucoma, right eye, mild stage: Secondary | ICD-10-CM | POA: Diagnosis not present

## 2017-04-06 DIAGNOSIS — H35423 Microcystoid degeneration of retina, bilateral: Secondary | ICD-10-CM | POA: Diagnosis not present

## 2017-04-06 DIAGNOSIS — H353124 Nonexudative age-related macular degeneration, left eye, advanced atrophic with subfoveal involvement: Secondary | ICD-10-CM | POA: Diagnosis not present

## 2017-04-06 DIAGNOSIS — H43813 Vitreous degeneration, bilateral: Secondary | ICD-10-CM | POA: Diagnosis not present

## 2017-04-06 DIAGNOSIS — H353211 Exudative age-related macular degeneration, right eye, with active choroidal neovascularization: Secondary | ICD-10-CM | POA: Diagnosis not present

## 2017-04-24 ENCOUNTER — Telehealth: Payer: Self-pay | Admitting: Family Medicine

## 2017-04-24 NOTE — Telephone Encounter (Signed)
Tried calling her # again states # D/c and silence. Tried the dtrs phone and the vm has not been set up.

## 2017-04-24 NOTE — Telephone Encounter (Signed)
Pt is wanting to know if something can be called in for bronchitis. Please advise.

## 2017-04-24 NOTE — Telephone Encounter (Signed)
Tried calling number busy.

## 2017-04-24 NOTE — Telephone Encounter (Signed)
I spoke with the Dtr [name] and told her I needed more information on her mother.She states she sounds horrible and has a productive cough and wanted something called in for this. I recommended that the pt please be evaluated by the urgent care for this as she may need a chest xray and antibx's.

## 2017-05-14 ENCOUNTER — Ambulatory Visit: Payer: PPO | Admitting: Family Medicine

## 2017-05-14 VITALS — BP 130/82 | Ht 63.0 in | Wt 176.4 lb

## 2017-05-14 DIAGNOSIS — J019 Acute sinusitis, unspecified: Secondary | ICD-10-CM | POA: Diagnosis not present

## 2017-05-14 DIAGNOSIS — R05 Cough: Secondary | ICD-10-CM

## 2017-05-14 DIAGNOSIS — R0789 Other chest pain: Secondary | ICD-10-CM

## 2017-05-14 DIAGNOSIS — R059 Cough, unspecified: Secondary | ICD-10-CM

## 2017-05-14 MED ORDER — DICLOFENAC SODIUM 75 MG PO TBEC: 75.0000 mg | DELAYED_RELEASE_TABLET | Freq: Two times a day (BID) | ORAL | 0 refills | Status: DC

## 2017-05-14 MED ORDER — AMOXICILLIN 500 MG PO TABS: 500.0000 mg | ORAL_TABLET | Freq: Three times a day (TID) | ORAL | 0 refills | Status: DC

## 2017-05-14 NOTE — Patient Instructions (Addendum)
If not better in 7 days please call  We sent in medication for you  Your back should gradually get better over the next 7-10 days  The cough should go away as well  If not getting better please let us know and we will help set up further testing and a follow-up visit

## 2017-05-14 NOTE — Progress Notes (Signed)
   Subjective:    Patient ID: Victoria Vasquez, female    DOB: 12/05/1927, 83 y.o.   MRN: [id number]  HPI Patient arrives with c/o cough for 6 months and upper back pain for 4 days pain for 4 days helpes a lady get up felt a pop Constant pain First few days could nt move well A little better now musculoskeletal pain  Cough for weeks Worse at night Some wheeze Cough present for 3 months   Review of Systems  Constitutional: Negative for activity change and fever.  HENT: Positive for congestion. Negative for ear pain and rhinorrhea.   Eyes: Negative for discharge.  Respiratory: Positive for cough. Negative for shortness of breath and wheezing.   Cardiovascular: Negative for chest pain.       Objective:   Physical Exam  Constitutional: She appears well-developed.  HENT:  Head: Normocephalic.  Right Ear: External ear normal.  Left Ear: External ear normal.  Nose: Nose normal.  Mouth/Throat: Oropharynx is clear and moist. No oropharyngeal exudate.  Eyes: Right eye exhibits no discharge. Left eye exhibits no discharge.  Neck: Neck supple. No tracheal deviation present.  Cardiovascular: Normal rate and normal heart sounds.  No murmur heard. Pulmonary/Chest: Effort normal and breath sounds normal. She has no wheezes. She has no rales.  Lymphadenopathy:    She has no cervical adenopathy.  Skin: Skin is warm and dry.  Nursing note and vitals reviewed.  There is no tenderness on her spine there is subjective tenderness in the right posterior muscle/rib region       Assessment & Plan:  Region more than likely a pulled muscle if it does not get dramatically better over the next 7-10 days then chest x-ray and thoracic spine x-ray also may use diclofenac twice daily for the next 7-10 days notify us if not getting better  Moderate sinus symptoms recommend amoxicillin 10 days if ongoing troubles notify us  Increased frequency of urination at night glucose is been relatively decent  control if this continues recommend patient be referred for urology evaluation

## 2017-06-15 ENCOUNTER — Ambulatory Visit (INDEPENDENT_AMBULATORY_CARE_PROVIDER_SITE_OTHER): Payer: PPO | Admitting: Otolaryngology

## 2017-06-15 DIAGNOSIS — H6122 Impacted cerumen, left ear: Secondary | ICD-10-CM

## 2017-06-15 DIAGNOSIS — H903 Sensorineural hearing loss, bilateral: Secondary | ICD-10-CM | POA: Diagnosis not present

## 2017-07-11 ENCOUNTER — Other Ambulatory Visit: Payer: Self-pay | Admitting: Family Medicine

## 2017-07-13 DIAGNOSIS — H35033 Hypertensive retinopathy, bilateral: Secondary | ICD-10-CM | POA: Diagnosis not present

## 2017-07-13 DIAGNOSIS — H353211 Exudative age-related macular degeneration, right eye, with active choroidal neovascularization: Secondary | ICD-10-CM | POA: Diagnosis not present

## 2017-07-13 DIAGNOSIS — H35423 Microcystoid degeneration of retina, bilateral: Secondary | ICD-10-CM | POA: Diagnosis not present

## 2017-07-13 DIAGNOSIS — H353124 Nonexudative age-related macular degeneration, left eye, advanced atrophic with subfoveal involvement: Secondary | ICD-10-CM | POA: Diagnosis not present

## 2017-07-20 ENCOUNTER — Encounter: Payer: Self-pay | Admitting: Family Medicine

## 2017-07-20 ENCOUNTER — Ambulatory Visit (INDEPENDENT_AMBULATORY_CARE_PROVIDER_SITE_OTHER): Payer: PPO | Admitting: Family Medicine

## 2017-07-20 VITALS — Ht 63.0 in | Wt 180.0 lb

## 2017-07-20 DIAGNOSIS — M79671 Pain in right foot: Secondary | ICD-10-CM | POA: Diagnosis not present

## 2017-07-20 DIAGNOSIS — Z13220 Encounter for screening for lipoid disorders: Secondary | ICD-10-CM | POA: Diagnosis not present

## 2017-07-20 DIAGNOSIS — Z780 Asymptomatic menopausal state: Secondary | ICD-10-CM

## 2017-07-20 DIAGNOSIS — I10 Essential (primary) hypertension: Secondary | ICD-10-CM

## 2017-07-20 DIAGNOSIS — E7849 Other hyperlipidemia: Secondary | ICD-10-CM | POA: Diagnosis not present

## 2017-07-20 DIAGNOSIS — Z13820 Encounter for screening for osteoporosis: Secondary | ICD-10-CM

## 2017-07-20 DIAGNOSIS — Z79899 Other long term (current) drug therapy: Secondary | ICD-10-CM | POA: Diagnosis not present

## 2017-07-20 DIAGNOSIS — E119 Type 2 diabetes mellitus without complications: Secondary | ICD-10-CM | POA: Diagnosis not present

## 2017-07-20 LAB — POCT GLYCOSYLATED HEMOGLOBIN (HGB A1C): HEMOGLOBIN A1C: 6

## 2017-07-20 NOTE — Addendum Note (Signed)
Addended by: Sallee Lange A on: 07/20/2017 12:39 PM   Modules accepted: Level of Service

## 2017-07-20 NOTE — Progress Notes (Addendum)
Subjective:    Patient ID: Victoria Vasquez, female    DOB: 10-13-1927, 84 y.o.   MRN: [id number]  HPI Patient is here today to follow up on Dm. She is currently on Glipizide 5 mg one in am and 1/2 qhs. Eats healty,Does not get much exercises.She see an eye Specialist Dr Yolanda Bonine. Bp elevated today did take her bp medications,but has has some dizziness lately and has a dull head ache. Patient has intermittent foot pain well has some arthritic changes in her feet  The patient was seen today as part of a comprehensive diabetic check up.The patient relates medication compliance. No significant side effects to the medications. Denies any low glucose spells. Relates compliance with diet to a reasonable level. Patient does do labwork intermittently and understands the dangers of diabetes.   Patient for blood pressure check up. Patient relates compliance with meds. Todays BP reviewed with the patient. Patient denies issues with medication. Patient relates reasonable diet. Patient tries to minimize salt. Patient aware of BP goals.  Patient here for follow-up regarding cholesterol.  Patient does try to maintain a reasonable diet.  Patient does take the medication on a regular basis.  Denies missing a dose.  The patient denies any obvious side effects.  Prior blood work results reviewed with the patient.  The patient is aware of his cholesterol goals and the need to keep it under good control to lessen the risk of disease.  Patient with significant kyphosis seems to be getting worse with age is due for bone density  Diabetic foot exam was completed  Review of Systems  Constitutional: Negative for activity change, appetite change and fatigue.  HENT: Negative for congestion.   Respiratory: Negative for cough.   Cardiovascular: Negative for chest pain.  Gastrointestinal: Negative for abdominal pain.  Endocrine: Negative for polydipsia and polyphagia.  Skin: Negative for color change.  Neurological:  Negative for weakness.  Psychiatric/Behavioral: Negative for confusion.   Results for orders placed or performed in visit on 07/20/17  POCT glycosylated hemoglobin (Hb A1C)  Result Value Ref Range   Hemoglobin A1C 6.0        Objective:   Physical Exam  Constitutional: She appears well-developed and well-nourished. No distress.  HENT:  Head: Normocephalic and atraumatic.  Eyes: Right eye exhibits no discharge. Left eye exhibits no discharge.  Neck: No tracheal deviation present.  Cardiovascular: Normal rate, regular rhythm and normal heart sounds.  No murmur heard. Pulmonary/Chest: Effort normal and breath sounds normal. No respiratory distress. She has no wheezes. She has no rales.  Musculoskeletal: She exhibits no edema.  Lymphadenopathy:    She has no cervical adenopathy.  Neurological: She is alert. She exhibits normal muscle tone.  Skin: Skin is warm and dry. No erythema.  Psychiatric: Her behavior is normal.  Vitals reviewed.         Assessment & Plan:  1. High risk medication use Do her labs - Hepatic function panel - Basic metabolic panel  2. Lipid screening Lipid profile recommended diabetic - Lipid panel  3. Diabetes mellitus without complication ([place]) The patient was seen today as part of a comprehensive visit for diabetes. The importance of keeping her A1c at or below 7 was discussed. Importance of regular physical activity was discussed. Proper monitoring of glucose levels with glucometer discussed. The importance of adherence to medication as well as a controlled low starch/sugar diet was also discussed. Also discussion regarding the importance of diabetic foot checks including self check every  day. Also yearly diabetic eye exams recommended. The importance of keeping blood pressure under control and keeping LDL below 70 was also discussed. Also the importance of avoiding smoking. Standard follow-up visit recommended. Finally failure to follow good diabetic  measures including self effort and compliance with recommendations can certainly increase the risk of heart disease strokes kidney failure blindness loss of limb and early death was discussed with the patient. Patient denies any low sugar spells - POCT glycosylated hemoglobin (Hb A1C)  4. Essential hypertension HTN- Patient was seen today as part of a visit regarding hypertension. The importance of healthy diet and regular physical activity was discussed. The importance of compliance with medications discussed. Ideal goal is to keep blood pressure low elevated levels certainly below 700/17 when possible. The patient was counseled that keeping blood pressure under control lessen his risk of heart attack, stroke, kidney failure, and early death. The importance of regular follow-ups was discussed with the patient. Low-salt diet such as DASH recommended. Regular physical activity was recommended as well. Patient was advised to keep regular follow-ups.   5. Other hyperlipidemia Patient cannot tolerate statins  6. Right foot pain Referral to podiatry bunion along with arthritic changes also severe callus on the left side - Ambulatory referral to Podiatry  7. Post-menopausal Kyphosis bone density recommended - DG Bone Density  8. Screening for osteoporosis Bone density recommended - DG Bone Density  25 minutes was spent with the patient.  This statement verifies that 25 minutes was indeed spent with the patient. Greater than half the time was spent in discussion, counseling and answering questions  regarding the issues that the patient came in for today as reflected in the diagnosis (s) please refer to documentation for further details. Discussion regarding her diabetes.  Also discussing how she cannot tolerate statins, blood pressure under good control, kyphosis needing bone density, chronic cough recommending the Symbicort

## 2017-07-22 DIAGNOSIS — H903 Sensorineural hearing loss, bilateral: Secondary | ICD-10-CM | POA: Diagnosis not present

## 2017-07-29 DIAGNOSIS — Z13220 Encounter for screening for lipoid disorders: Secondary | ICD-10-CM | POA: Diagnosis not present

## 2017-07-29 DIAGNOSIS — Z79899 Other long term (current) drug therapy: Secondary | ICD-10-CM | POA: Diagnosis not present

## 2017-07-30 LAB — BASIC METABOLIC PANEL
BUN/Creatinine Ratio: 15 (ref 12–28)
BUN: 21 mg/dL (ref 8–27)
CO2: 22 mmol/L (ref 20–29)
CREATININE: 1.42 mg/dL — AB (ref 0.57–1.00)
Calcium: 9.1 mg/dL (ref 8.7–10.3)
Chloride: 105 mmol/L (ref 96–106)
GFR calc Af Amer: 38 mL/min/{1.73_m2} — ABNORMAL LOW (ref >59)
GFR calc non Af Amer: 33 mL/min/{1.73_m2} — ABNORMAL LOW (ref >59)
GLUCOSE: 165 mg/dL — AB (ref 65–99)
Potassium: 4.8 mmol/L (ref 3.5–5.2)
SODIUM: 145 mmol/L — AB (ref 134–144)

## 2017-07-30 LAB — LIPID PANEL
CHOLESTEROL TOTAL: 233 mg/dL — AB (ref 100–199)
Chol/HDL Ratio: 4.5 ratio — ABNORMAL HIGH (ref 0.0–4.4)
HDL: 52 mg/dL (ref >39)
LDL Calculated: 156 mg/dL — ABNORMAL HIGH (ref 0–99)
Triglycerides: 123 mg/dL (ref 0–149)
VLDL Cholesterol Cal: 25 mg/dL (ref 5–40)

## 2017-07-30 LAB — HEPATIC FUNCTION PANEL
ALK PHOS: 84 IU/L (ref 39–117)
ALT: 11 IU/L (ref 0–32)
AST: 16 IU/L (ref 0–40)
Albumin: 4.6 g/dL (ref 3.5–4.7)
Bilirubin Total: 0.7 mg/dL (ref 0.0–1.2)
Bilirubin, Direct: 0.16 mg/dL (ref 0.00–0.40)
TOTAL PROTEIN: 7 g/dL (ref 6.0–8.5)

## 2017-08-03 ENCOUNTER — Other Ambulatory Visit: Payer: Self-pay | Admitting: *Deleted

## 2017-08-03 DIAGNOSIS — Z79899 Other long term (current) drug therapy: Secondary | ICD-10-CM

## 2017-08-13 ENCOUNTER — Ambulatory Visit (HOSPITAL_COMMUNITY)
Admission: RE | Admit: 2017-08-13 | Discharge: 2017-08-13 | Disposition: A | Discharge status: Home / Self Care | Payer: PPO | Source: Ambulatory Visit | Attending: Family Medicine | Admitting: Family Medicine

## 2017-08-13 DIAGNOSIS — M8589 Other specified disorders of bone density and structure, multiple sites: Secondary | ICD-10-CM | POA: Diagnosis not present

## 2017-08-13 DIAGNOSIS — M85851 Other specified disorders of bone density and structure, right thigh: Secondary | ICD-10-CM | POA: Insufficient documentation

## 2017-08-13 DIAGNOSIS — Z13820 Encounter for screening for osteoporosis: Secondary | ICD-10-CM | POA: Diagnosis not present

## 2017-08-13 DIAGNOSIS — Z780 Asymptomatic menopausal state: Secondary | ICD-10-CM | POA: Diagnosis not present

## 2017-08-21 DIAGNOSIS — L84 Corns and callosities: Secondary | ICD-10-CM | POA: Diagnosis not present

## 2017-08-21 DIAGNOSIS — L97519 Non-pressure chronic ulcer of other part of right foot with unspecified severity: Secondary | ICD-10-CM | POA: Diagnosis not present

## 2017-08-21 DIAGNOSIS — E119 Type 2 diabetes mellitus without complications: Secondary | ICD-10-CM | POA: Diagnosis not present

## 2017-08-21 DIAGNOSIS — M79674 Pain in right toe(s): Secondary | ICD-10-CM | POA: Diagnosis not present

## 2017-08-22 ENCOUNTER — Encounter: Payer: Self-pay | Admitting: Family Medicine

## 2017-09-02 DIAGNOSIS — Z79899 Other long term (current) drug therapy: Secondary | ICD-10-CM | POA: Diagnosis not present

## 2017-09-03 LAB — BASIC METABOLIC PANEL
BUN / CREAT RATIO: 15 (ref 12–28)
BUN: 23 mg/dL (ref 8–27)
CO2: 23 mmol/L (ref 20–29)
CREATININE: 1.49 mg/dL — AB (ref 0.57–1.00)
Calcium: 9.3 mg/dL (ref 8.7–10.3)
Chloride: 103 mmol/L (ref 96–106)
GFR calc Af Amer: 36 mL/min/{1.73_m2} — ABNORMAL LOW (ref >59)
GFR calc non Af Amer: 31 mL/min/{1.73_m2} — ABNORMAL LOW (ref >59)
GLUCOSE: 212 mg/dL — AB (ref 65–99)
POTASSIUM: 5 mmol/L (ref 3.5–5.2)
SODIUM: 140 mmol/L (ref 134–144)

## 2017-09-04 DIAGNOSIS — M79674 Pain in right toe(s): Secondary | ICD-10-CM | POA: Diagnosis not present

## 2017-09-04 DIAGNOSIS — E119 Type 2 diabetes mellitus without complications: Secondary | ICD-10-CM | POA: Diagnosis not present

## 2017-09-04 DIAGNOSIS — L97511 Non-pressure chronic ulcer of other part of right foot limited to breakdown of skin: Secondary | ICD-10-CM | POA: Diagnosis not present

## 2017-09-07 ENCOUNTER — Other Ambulatory Visit: Payer: Self-pay | Admitting: Family Medicine

## 2017-09-16 ENCOUNTER — Other Ambulatory Visit: Payer: Self-pay | Admitting: Family Medicine

## 2017-09-16 ENCOUNTER — Encounter: Payer: Self-pay | Admitting: Family Medicine

## 2017-09-16 ENCOUNTER — Ambulatory Visit (INDEPENDENT_AMBULATORY_CARE_PROVIDER_SITE_OTHER): Payer: PPO | Admitting: Family Medicine

## 2017-09-16 VITALS — BP 178/98 | Temp 98.0°F | Ht 63.0 in | Wt 182.0 lb

## 2017-09-16 DIAGNOSIS — N6459 Other signs and symptoms in breast: Secondary | ICD-10-CM

## 2017-09-16 MED ORDER — MUPIROCIN 2 % EX OINT: TOPICAL_OINTMENT | CUTANEOUS | 1 refills | Status: DC

## 2017-09-16 NOTE — Progress Notes (Signed)
   Subjective:    Patient ID: Victoria Vasquez, female    DOB: 1927/12/30, 84 y.o.   MRN: [id number]  HPI Patient is here today with complaints of bleeding from her left nipple ongoing for the last 2-3 weeks.Itches and hurts to lay on the left side.Has not put anything on it. She states it a cracked or scaly like. Patient feels like it has been scaling some cracking some bleeding some she was concerned about it denies any pain or discomfort denies fever chills sweats.  Last mammogram was 2009 Review of Systems Please see above    Objective:   Physical Exam  Lungs clear respiratory rate normal heart is regular breast exam bilateral is normal except for the left nipple is abnormal with redness inflammation and some bleeding      Assessment & Plan:  Need to rule out the possibility of a superficial breast cancer recommend diagnostic mammogram recommend referral to Dr. Fransisca Kaufmann office for further evaluation possible biopsy treat with Bactroban for now

## 2017-09-17 ENCOUNTER — Other Ambulatory Visit: Payer: Self-pay | Admitting: Family Medicine

## 2017-09-17 DIAGNOSIS — R928 Other abnormal and inconclusive findings on diagnostic imaging of breast: Secondary | ICD-10-CM

## 2017-09-22 ENCOUNTER — Encounter (HOSPITAL_COMMUNITY): Payer: Self-pay

## 2017-09-22 ENCOUNTER — Ambulatory Visit (HOSPITAL_COMMUNITY)
Admission: RE | Admit: 2017-09-22 | Discharge: 2017-09-22 | Disposition: A | Discharge status: Home / Self Care | Payer: PPO | Source: Ambulatory Visit | Attending: Family Medicine | Admitting: Family Medicine

## 2017-09-22 DIAGNOSIS — N6489 Other specified disorders of breast: Secondary | ICD-10-CM | POA: Diagnosis not present

## 2017-09-22 DIAGNOSIS — R928 Other abnormal and inconclusive findings on diagnostic imaging of breast: Secondary | ICD-10-CM | POA: Insufficient documentation

## 2017-09-22 DIAGNOSIS — N6459 Other signs and symptoms in breast: Secondary | ICD-10-CM | POA: Insufficient documentation

## 2017-09-23 DIAGNOSIS — H401111 Primary open-angle glaucoma, right eye, mild stage: Secondary | ICD-10-CM | POA: Diagnosis not present

## 2017-09-23 DIAGNOSIS — E119 Type 2 diabetes mellitus without complications: Secondary | ICD-10-CM | POA: Diagnosis not present

## 2017-09-24 ENCOUNTER — Encounter: Payer: Self-pay | Admitting: General Surgery

## 2017-09-24 ENCOUNTER — Ambulatory Visit (INDEPENDENT_AMBULATORY_CARE_PROVIDER_SITE_OTHER): Payer: PPO | Admitting: General Surgery

## 2017-09-24 VITALS — BP 204/99 | HR 68 | Temp 98.7°F | Resp 16 | Wt 181.0 lb

## 2017-09-24 DIAGNOSIS — C50912 Malignant neoplasm of unspecified site of left female breast: Secondary | ICD-10-CM | POA: Diagnosis not present

## 2017-09-24 NOTE — Patient Instructions (Signed)
Victoria Vasquez  09/24/2017     @PREFPERIOPPHARMACY @   Your procedure is scheduled on  09/28/2017 .  Report to [facility] at  710   A.M.  Call this number if you have problems the morning of surgery:  [phone]   Remember:  Do not eat food or drink liquids after midnight.  Take these medicines the morning of surgery with A SIP OF WATER  Losartan.   Do not wear jewelry, make-up or nail polish.  Do not wear lotions, powders, or perfumes, or deodorant.  Do not shave 48 hours prior to surgery.  Men may shave face and neck.  Do not bring valuables to the hospital.  [facility] is not responsible for any belongings or valuables.  Contacts, dentures or bridgework may not be worn into surgery.  Leave your suitcase in the car.  After surgery it may be brought to your room.  For patients admitted to the hospital, discharge time will be determined by your treatment team.  Patients discharged the day of surgery will not be allowed to drive home.   Name and phone number of your driver:   family Special instructions:  None  Please read over the following fact sheets that you were given. Anesthesia Post-op Instructions and Care and Recovery After Surgery       Breast Biopsy A breast biopsy is a test during which a sample of tissue is taken from your breast. The breast tissue is looked at under a microscope for cancer cells. What happens before the procedure?  Plan to have someone take you home after the test.  Do not use tobacco products. These include cigarettes, chewing tobacco, or e-cigarettes. If you need help quitting, ask your doctor.  Do not drink alcohol for 24 hours before the test.  Ask your doctor about: ? Changing or stopping your normal medicines. This is important if you take diabetes medicines or blood thinners. ? Taking medicines such as aspirin and ibuprofen. These medicines can thin your blood. Do not take these medicines before your procedure if  your doctor tells you not to.  Wear a good support bra to the test.  Ask your doctor how your surgical site will be marked or identified.  You may be given antibiotic medicine to help prevent infection.  You may be checked for extra fluid in your body (lymphedema).  Your doctor may place a wire or a seed in the lump. The wire or seed gives off radiation. This will help your doctor to see the lump during the biopsy. What happens during the procedure? You may be given the following:  A medicine to numb the breast area (local anesthetic).  A medicine to help you relax (sedative).  There are different types of breast biopsies. Each type is described below. Fine-Needle Aspiration  A needle will be put into the breast lump.  The needle will take out fluid and cells from the lump. Core-Needle Biopsy  A needle will be put into the breast lump.  The needle will be put into your breast several times.  The needle will remove breast tissue. Stereotactic Biopsy  You will lie on a table on your belly. Your breast will pass through a hole in the table. Your breast will be held in place.  X-rays and a computer will be used to locate the breast lump.  A needle will be used to remove tissue samples from your breast. Vacuum-Assisted  Biopsy  A small cut (incision) will be made in your breast.  A biopsy device will be put through the cut and into the breast tissue.  The biopsy device will draw abnormal breast tissue into the biopsy device.  A large tissue sample will often be removed.  No stitches will be needed. Ultrasound-Guided Core-Needle Biopsy  Ultrasound imaging will help guide the needle into the area of the breast that is not normal.  A cut will be made in the breast. The needle will be put into the breast lump.  Tissue samples will be taken out. Surgical Biopsy  A cut will be made in the breast to remove tissue.  The cut will be closed with stitches and covered with a  bandage.  There are two types: ? Incisional biopsy. Your doctor will remove part of the breast lump. ? Excisional biopsy. Your doctor will try to remove the whole breast lump or as much as possible. All tissue or fluid samples will be looked at under a microscope. What happens after the procedure?  You will be able to go home when you are doing well and you are not having problems.  You may have bruising on your breast. This is normal.  A pressure bandage (dressing) may be put on your breast. It may be left on for 24-48 hours. This type of bandage is wrapped tightly around your chest. It helps to stop fluid from building up under tissues. You may also need to wear a supportive bra during this time.  Do not drive for 24 hours if you received a sedative. This information is not intended to replace advice given to you by your health care provider. Make sure you discuss any questions you have with your health care provider. Document Released: 07/28/2011 Document Revised: 01/10/2016 Document Reviewed: 02/06/2015 Elsevier Interactive Patient Education  2018 [employer].  Breast Biopsy, Care After These instructions give you information about caring for yourself after your procedure. Your doctor may also give you more specific instructions. Call your doctor if you have any problems or questions after your procedure. Follow these instructions at home: Medicines  Take over-the-counter and prescription medicines only as told by your doctor.  Do not drive for 24 hours if you received a sedative.  Do not drink alcohol while taking pain medicine.  Do not drive or use heavy machinery while taking prescription pain medicine. Biopsy Site Care   Follow instructions from your doctor about how to take care of your cut from surgery (incision) or puncture area. Make sure you: ? Wash your hands with soap and water before you change your bandage. If you cannot use soap and water, use hand  sanitizer. ? Change any bandages (dressings) as told by your doctor. ? Leave any stitches (sutures), skin glue, or skin tape (adhesive) strips in place. They may need to stay in place for 2 weeks or longer. If tape strips get loose and curl up, you may trim the loose edges. Do not remove tape strips completely unless your doctor says it is okay.  If you have stitches, keep them dry when you take a bath or a shower.  Check your cut or puncture area every day for signs of infection. Check for: ? More redness, swelling, or pain. ? More fluid or blood. ? Warmth. ? Pus or a bad smell.  Protect the biopsy area. Do not let the area get bumped. Activity  Avoid activities that could pull the biopsy site open. ? Avoid  stretching. ? Avoid reaching. ? Avoid exercise. ? Avoid sports. ? Avoid lifting anything that is heavier than 3 pounds (1.4 kg).  Return to your normal activities as told by your doctor. Ask your doctor what activities are safe for you. General instructions  Continue your normal diet.  Wear a good support bra for as long as told by your doctor.  Get checked for extra fluid in your body (lymphedema) as often as told by your doctor.  Keep all follow-up visits as told by your doctor. This is important. Contact a health care provider if:  You have more redness, swelling, or pain at the biopsy site.  You have more fluid or blood coming from your biopsy site.  Your biopsy site feels warm to the touch.  You have pus or a bad smell coming from the biopsy site.  Your biopsy site breaks open after the stitches, staples, or skin tape strips have been removed.  You have a rash.  You have a fever. Get help right away if:  You have more bleeding (more than a small spot) from the biopsy site.  You have trouble breathing.  You have red streaks around the biopsy site. This information is not intended to replace advice given to you by your health care provider. Make sure you  discuss any questions you have with your health care provider. Document Released: 03/01/2009 Document Revised: 01/10/2016 Document Reviewed: 02/06/2015 Elsevier Interactive Patient Education  2018 [place] Anesthesia, Adult General anesthesia is the use of medicines to make a person "go to sleep" (be unconscious) for a medical procedure. General anesthesia is often recommended when a procedure:  Is long.  Requires you to be still or in an unusual position.  Is major and can cause you to lose blood.  Is impossible to do without general anesthesia.  The medicines used for general anesthesia are called general anesthetics. In addition to making you sleep, the medicines:  Prevent pain.  Control your blood pressure.  Relax your muscles.  Tell a health care provider about:  Any allergies you have.  All medicines you are taking, including vitamins, herbs, eye drops, creams, and over-the-counter medicines.  Any problems you or family members have had with anesthetic medicines.  Types of anesthetics you have had in the past.  Any bleeding disorders you have.  Any surgeries you have had.  Any medical conditions you have.  Any history of heart or lung conditions, such as heart failure, sleep apnea, or chronic obstructive pulmonary disease (COPD).  Whether you are pregnant or may be pregnant.  Whether you use tobacco, alcohol, marijuana, or street drugs.  Any history of Armed forces logistics/support/administrative officer.  Any history of depression or anxiety. What are the risks? Generally, this is a safe procedure. However, problems may occur, including:  Allergic reaction to anesthetics.  Lung and heart problems.  Inhaling food or liquids from your stomach into your lungs (aspiration).  Injury to nerves.  Waking up during your procedure and being unable to move (rare).  Extreme agitation or a state of mental confusion (delirium) when you wake up from the anesthetic.  Air in the  bloodstream, which can lead to stroke.  These problems are more likely to develop if you are having a major surgery or if you have an advanced medical condition. You can prevent some of these complications by answering all of your health care provider's questions thoroughly and by following all pre-procedure instructions. General anesthesia can cause side effects, including:  Nausea or vomiting  A sore throat from the breathing tube.  Feeling cold or shivery.  Feeling tired, washed out, or achy.  Sleepiness or drowsiness.  Confusion or agitation.  What happens before the procedure? Staying hydrated Follow instructions from your health care provider about hydration, which may include:  Up to 2 hours before the procedure - you may continue to drink clear liquids, such as water, clear fruit juice, black coffee, and plain tea.  Eating and drinking restrictions Follow instructions from your health care provider about eating and drinking, which may include:  8 hours before the procedure - stop eating heavy meals or foods such as meat, fried foods, or fatty foods.  6 hours before the procedure - stop eating light meals or foods, such as toast or cereal.  6 hours before the procedure - stop drinking milk or drinks that contain milk.  2 hours before the procedure - stop drinking clear liquids.  Medicines  Ask your health care provider about: ? Changing or stopping your regular medicines. This is especially important if you are taking diabetes medicines or blood thinners. ? Taking medicines such as aspirin and ibuprofen. These medicines can thin your blood. Do not take these medicines before your procedure if your health care provider instructs you not to. ? Taking new dietary supplements or medicines. Do not take these during the week before your procedure unless your health care provider approves them.  If you are told to take a medicine or to continue taking a medicine on the day of  the procedure, take the medicine with sips of water. General instructions   Ask if you will be going home the same day, the following day, or after a longer hospital stay. ? Plan to have someone take you home. ? Plan to have someone stay with you for the first 24 hours after you leave the hospital or clinic.  For 3-6 weeks before the procedure, try not to use any tobacco products, such as cigarettes, chewing tobacco, and e-cigarettes.  You may brush your teeth on the morning of the procedure, but make sure to spit out the toothpaste. What happens during the procedure?  You will be given anesthetics through a mask and through an IV tube in one of your veins.  You may receive medicine to help you relax (sedative).  As soon as you are asleep, a breathing tube may be used to help you breathe.  An anesthesia specialist will stay with you throughout the procedure. He or she will help keep you comfortable and safe by continuing to give you medicines and adjusting the amount of medicine that you get. He or she will also watch your blood pressure, pulse, and oxygen levels to make sure that the anesthetics do not cause any problems.  If a breathing tube was used to help you breathe, it will be removed before you wake up. The procedure may vary among health care providers and hospitals. What happens after the procedure?  You will wake up, often slowly, after the procedure is complete, usually in a recovery area.  Your blood pressure, heart rate, breathing rate, and blood oxygen level will be monitored until the medicines you were given have worn off.  You may be given medicine to help you calm down if you feel anxious or agitated.  If you will be going home the same day, your health care provider may check to make sure you can stand, drink, and urinate.  Your health care providers  will treat your pain and side effects before you go home.  Do not drive for 24 hours if you received a  sedative.  You may: ? Feel nauseous and vomit. ? Have a sore throat. ? Have mental slowness. ? Feel cold or shivery. ? Feel sleepy. ? Feel tired. ? Feel sore or achy, even in parts of your body where you did not have surgery. This information is not intended to replace advice given to you by your health care provider. Make sure you discuss any questions you have with your health care provider. Document Released: 08/12/2007 Document Revised: 10/16/2015 Document Reviewed: 04/19/2015 Elsevier Interactive Patient Education  2018 [place] Anesthesia, Adult, Care After These instructions provide you with information about caring for yourself after your procedure. Your health care provider may also give you more specific instructions. Your treatment has been planned according to current medical practices, but problems sometimes occur. Call your health care provider if you have any problems or questions after your procedure. What can I expect after the procedure? After the procedure, it is common to have:  Vomiting.  A sore throat.  Mental slowness.  It is common to feel:  Nauseous.  Cold or shivery.  Sleepy.  Tired.  Sore or achy, even in parts of your body where you did not have surgery.  Follow these instructions at home: For at least 24 hours after the procedure:  Do not: ? Participate in activities where you could fall or become injured. ? Drive. ? Use heavy machinery. ? Drink alcohol. ? Take sleeping pills or medicines that cause drowsiness. ? Make important decisions or sign legal documents. ? Take care of children on your own.  Rest. Eating and drinking  If you vomit, drink water, juice, or soup when you can drink without vomiting.  Drink enough fluid to keep your urine clear or pale yellow.  Make sure you have little or no nausea before eating solid foods.  Follow the diet recommended by your health care provider. General instructions  Have a  responsible adult stay with you until you are awake and alert.  Return to your normal activities as told by your health care provider. Ask your health care provider what activities are safe for you.  Take over-the-counter and prescription medicines only as told by your health care provider.  If you smoke, do not smoke without supervision.  Keep all follow-up visits as told by your health care provider. This is important. Contact a health care provider if:  You continue to have nausea or vomiting at home, and medicines are not helpful.  You cannot drink fluids or start eating again.  You cannot urinate after 8-12 hours.  You develop a skin rash.  You have fever.  You have increasing redness at the site of your procedure. Get help right away if:  You have difficulty breathing.  You have chest pain.  You have unexpected bleeding.  You feel that you are having a life-threatening or urgent problem. This information is not intended to replace advice given to you by your health care provider. Make sure you discuss any questions you have with your health care provider. Document Released: 08/11/2000 Document Revised: 10/08/2015 Document Reviewed: 04/19/2015 Elsevier Interactive Patient Education  [employer].

## 2017-09-24 NOTE — H&P (Signed)
Victoria Vasquez; [id number]; 1927/06/01   HPI Patient is an 84 year old white female who was referred to my care by Dr. Sallee Lange for evaluation treatment of an abnormal mammogram of the left breast.  Patient states that she has had an abnormal left nipple for some time now and occasionally it bleeds.  She did have a mammogram and there was the suggestion that she has Paget's disease of the left breast.  No mass has been noted.  There is no family history of breast cancer.  Patient denies any left breast pain. Past Medical History:  Diagnosis Date  . Asthma   . Chest pain    Evaluation prior to 2003 reportedly including cath-records never located; Echo in 2009: Mild LVH, LAE2, nl EF, moderate LAE, MR1-2  . Chronic kidney disease   . COPD (chronic obstructive pulmonary disease) (HCC)    Exertional dyspnea; PFTs in 2002: Moderate small airway obstruction, mildly reduced lung volumes and DLCO, nl ABG  . Diabetes mellitus, type II ([place])   . Fatigue   . Hyperlipidemia   . Hypertension   . Legally blind 1 1 2015  . Macular degeneration of both eyes 1 1 2015  . Parotid mass 2003   Right-2003  . Scoliosis   . Shingles     Past Surgical History:  Procedure Laterality Date  . APPENDECTOMY  1951  . CHOLECYSTECTOMY  2003  . COLONOSCOPY  04/16/2006   [place]-Normal colon.  Exam limited due to retained particulate matter in the lumen of the colon, polyps less than 1 cm would have been easily missed.  Otherwise, no polyps, masses, inflammatory changes or vascular ectasia seen/ Normal retroflexed view of the rectum..  recommend repeat exam at 03/2011  . ORIF  1996  . ORIF ANKLE FRACTURE  1992    Family History  Problem Relation Age of Onset  . Stomach cancer Mother   . Alzheimer's disease Sister   . Colon cancer Neg Hx     Current Outpatient Medications on File Prior to Visit  Medication Sig Dispense Refill  . aspirin 81 MG tablet Take 81 mg by mouth daily.    . budesonide-formoterol  (SYMBICORT) 160-4.5 MCG/ACT inhaler USE 2 PUFFS TWICE DAILY FOR ASTHMA. RINSE MOUTH AFTER USE. 10.2 g 5  . glipiZIDE (GLUCOTROL) 5 MG tablet TAKE 1 TABLET BY MOUTH EACH MORNING AND 1/2 TABLET AT SUPPER. 45 tablet 5  . glucose blood (ONE TOUCH TEST STRIPS) test strip Use as instructed to test blood glucose 4 times a day 100 each 3  . latanoprost (XALATAN) 0.005 % ophthalmic solution Place 1 drop into the right eye at bedtime.    Marland Kitchen losartan (COZAAR) 25 MG tablet Take 1 tablet (25 mg total) by mouth daily. 90 tablet 1  . Multiple Vitamins-Minerals (ICAPS) CAPS Take by mouth 2 (two) times daily. Reported on 07/05/2015    . mupirocin ointment (BACTROBAN) 2 % Apply thin amount on affected area daily 22 g 1  . ONE TOUCH ULTRA TEST test strip USE TO TEST FOUR TIMES DAILY. 150 each 5   No current facility-administered medications on file prior to visit.     Allergies  Allergen Reactions  . Statins     Side effects- myalgias  . Lisinopril Cough    cough    Social History   Substance and Sexual Activity  Alcohol Use No    Social History   Tobacco Use  Smoking Status Passive Smoke Exposure - Never Smoker  Smokeless Tobacco Never  Used    Review of Systems  Constitutional: Positive for malaise/fatigue.  HENT: Positive for sinus pain.   Eyes: Negative.   Respiratory: Positive for cough and shortness of breath.   Cardiovascular: Negative.   Gastrointestinal: Negative.   Genitourinary: Negative.   Musculoskeletal: Positive for back pain.  Skin: Negative.   Neurological: Positive for dizziness.  Endo/Heme/Allergies: Negative.   Psychiatric/Behavioral: Negative.     Objective   Vitals:   09/24/17 1100  BP: (!) 204/99  Pulse: 68  Resp: 16  Temp: 98.7 F (37.1 C)    Physical Exam  Constitutional: She is oriented to person, place, and time. She appears well-developed and well-nourished.  HENT:  Head: Normocephalic and atraumatic.  Cardiovascular: Normal rate, regular rhythm  and normal heart sounds. Exam reveals no gallop and no friction rub.  No murmur heard. Pulmonary/Chest: Effort normal and breath sounds normal. No stridor. No respiratory distress. She has no wheezes. She has no rales.  Neurological: She is alert and oriented to person, place, and time.  Vitals reviewed. Breast: Left breast with deformed left nipple with pink, exudative material present.  Irregular margins are noted.  No dominant masses noted.  The left axilla is negative for palpable nodes.  Right breast examination reveals no dominant mass, nipple discharge, dimpling.  The axilla was negative for palpable nodes.  Mammogram results reviewed Dr. Colleen Can notes reviewed  Assessment  Abnormality of left nipple, highly suggestive of Paget's disease Hypertension Plan   Patient is scheduled for left breast biopsy on 09/28/2017.  In reviewing her records, the patient is on blood pressure medication but does run hypertensive.  My intention is to have anesthesia present, but primarily localized the biopsy site.  The risks and benefits of the procedure were fully explained to the patient, who gave informed consent.

## 2017-09-24 NOTE — H&P (View-Only) (Signed)
Victoria Vasquez; [id number]; [birth year]   HPI Patient is an 84 year old white female who was referred to my care by Dr. Sallee Lange for evaluation treatment of an abnormal mammogram of the left breast.  Patient states that she has had an abnormal left nipple for some time now and occasionally it bleeds.  She did have a mammogram and there was the suggestion that she has Paget's disease of the left breast.  No mass has been noted.  There is no family history of breast cancer.  Patient denies any left breast pain. Past Medical History:  Diagnosis Date  . Asthma   . Chest pain    Evaluation prior to 2003 reportedly including cath-records never located; Echo in 2009: Mild LVH, LAE2, nl EF, moderate LAE, MR1-2  . Chronic kidney disease   . COPD (chronic obstructive pulmonary disease) (HCC)    Exertional dyspnea; PFTs in 2002: Moderate small airway obstruction, mildly reduced lung volumes and DLCO, nl ABG  . Diabetes mellitus, type II ([place])   . Fatigue   . Hyperlipidemia   . Hypertension   . Legally blind 1 1 2015  . Macular degeneration of both eyes 1 1 2015  . Parotid mass 2003   Right-2003  . Scoliosis   . Shingles     Past Surgical History:  Procedure Laterality Date  . APPENDECTOMY  1951  . CHOLECYSTECTOMY  2003  . COLONOSCOPY  04/16/2006   [facility]-Normal colon.  Exam limited due to retained particulate matter in the lumen of the colon, polyps less than 1 cm would have been easily missed.  Otherwise, no polyps, masses, inflammatory changes or vascular ectasia seen/ Normal retroflexed view of the rectum..  recommend repeat exam at 03/2011  . ORIF  1996  . ORIF ANKLE FRACTURE  1992    Family History  Problem Relation Age of Onset  . Stomach cancer Mother   . Alzheimer's disease Sister   . Colon cancer Neg Hx     Current Outpatient Medications on File Prior to Visit  Medication Sig Dispense Refill  . aspirin 81 MG tablet Take 81 mg by mouth daily.    . budesonide-formoterol  (SYMBICORT) 160-4.5 MCG/ACT inhaler USE 2 PUFFS TWICE DAILY FOR ASTHMA. RINSE MOUTH AFTER USE. 10.2 g 5  . glipiZIDE (GLUCOTROL) 5 MG tablet TAKE 1 TABLET BY MOUTH EACH MORNING AND 1/2 TABLET AT SUPPER. 45 tablet 5  . glucose blood (ONE TOUCH TEST STRIPS) test strip Use as instructed to test blood glucose 4 times a day 100 each 3  . latanoprost (XALATAN) 0.005 % ophthalmic solution Place 1 drop into the right eye at bedtime.    Marland Kitchen losartan (COZAAR) 25 MG tablet Take 1 tablet (25 mg total) by mouth daily. 90 tablet 1  . Multiple Vitamins-Minerals (ICAPS) CAPS Take by mouth 2 (two) times daily. Reported on 07/05/2015    . mupirocin ointment (BACTROBAN) 2 % Apply thin amount on affected area daily 22 g 1  . ONE TOUCH ULTRA TEST test strip USE TO TEST FOUR TIMES DAILY. 150 each 5   No current facility-administered medications on file prior to visit.     Allergies  Allergen Reactions  . Statins     Side effects- myalgias  . Lisinopril Cough    cough    Social History   Substance and Sexual Activity  Alcohol Use No    Social History   Tobacco Use  Smoking Status Passive Smoke Exposure - Never Smoker  Smokeless Tobacco Never  Used    Review of Systems  Constitutional: Positive for malaise/fatigue.  HENT: Positive for sinus pain.   Eyes: Negative.   Respiratory: Positive for cough and shortness of breath.   Cardiovascular: Negative.   Gastrointestinal: Negative.   Genitourinary: Negative.   Musculoskeletal: Positive for back pain.  Skin: Negative.   Neurological: Positive for dizziness.  Endo/Heme/Allergies: Negative.   Psychiatric/Behavioral: Negative.     Objective   Vitals:   09/24/17 1100  BP: (!) 204/99  Pulse: 68  Resp: 16  Temp: 98.7 F (37.1 C)    Physical Exam  Constitutional: She is oriented to person, place, and time. She appears well-developed and well-nourished.  HENT:  Head: Normocephalic and atraumatic.  Cardiovascular: Normal rate, regular rhythm  and normal heart sounds. Exam reveals no gallop and no friction rub.  No murmur heard. Pulmonary/Chest: Effort normal and breath sounds normal. No stridor. No respiratory distress. She has no wheezes. She has no rales.  Neurological: She is alert and oriented to person, place, and time.  Vitals reviewed. Breast: Left breast with deformed left nipple with pink, exudative material present.  Irregular margins are noted.  No dominant masses noted.  The left axilla is negative for palpable nodes.  Right breast examination reveals no dominant mass, nipple discharge, dimpling.  The axilla was negative for palpable nodes.  Mammogram results reviewed Dr. Colleen Can notes reviewed  Assessment  Abnormality of left nipple, highly suggestive of Paget's disease Hypertension Plan   Patient is scheduled for left breast biopsy on 09/28/2017.  In reviewing her records, the patient is on blood pressure medication but does run hypertensive.  My intention is to have anesthesia present, but primarily localized the biopsy site.  The risks and benefits of the procedure were fully explained to the patient, who gave informed consent.

## 2017-09-24 NOTE — Patient Instructions (Signed)
Breast Biopsy A breast biopsy is a procedure in which a sample of suspicious breast tissue is removed from your breast. Following the procedure, the tissue or liquid that is removed from the breast is examined under a microscope to see if cancerous cells are present. You may need a breast biopsy if you have:  Any undiagnosed breast mass (tumor).  Nipple abnormalities, dimpling, crusting, or ulcerations.  Abnormal discharge from the nipple, especially blood.  Redness, swelling, and pain of the breast.  Calcium deposits (calcifications) or abnormalities seen on a mammogram, ultrasound results, or MRI results.  Suspicious changes in the breast seen on your mammogram.  If the breast abnormality is found to be cancerous (malignant), a breast biopsy can help to determine what the best treatment is for you. There are many different types of breast biopsies. Talk with your health care provider about your options and which type is best for you. Tell a health care provider about:  Any allergies you have.  All medicines you are taking, including vitamins, herbs, eye drops, creams, and over-the-counter medicines.  Any problems you or family members have had with anesthetic medicines.  Any blood disorders you have.  Any surgeries you have had.  Any medical conditions you have.  Whether you are pregnant or may be pregnant. What are the risks? Generally, this is a safe procedure. However, problems may occur, including:  Bleeding.  Infection.  Discomfort. This is temporary.  Allergic reactions to medicines.  Bruising and swelling of the breast.  Alteration in the shape of the breast.  Damage to other tissues.  Not finding the lump or abnormality.  Needing more surgery.  What happens before the procedure?  Plan to have someone take you home after the procedure.  Do not use any tobacco products, such as cigarettes, chewing tobacco, and e-cigarettes. If you need help quitting,  ask your health care provider.  Do not drink alcohol for 24 hours before the procedure.  Ask your health care provider about: ? Changing or stopping your regular medicines. This is especially important if you are taking diabetes medicines or blood thinners. ? Taking medicines such as aspirin and ibuprofen. These medicines can thin your blood. Do not take these medicines before your procedure if your health care provider instructs you not to.  Wear a good support bra to the procedure.  Ask your health care provider how your surgical site will be marked or identified.  You may be given antibiotic medicine to help prevent infection.  Your health care provider may perform a procedure to place a wire (needle localization) or a seed that gives off radiation (radioactive seed localization) in the breast lump. A mammogram, ultrasound, MRI, or a combination of these techniques will be done during this procedure to identify the location of the breast abnormality. The imaging technique used will depend on the type of biopsy you are having. The wire or seed will help the health care provider locate the lump when performing the biopsy, especially if the lump cannot be felt. What happens during the procedure? You may be given one or both of the following:  A medicine to numb the breast area (local anesthetic).  A medicine to help you relax (sedative) during the procedure.  The following are the different types of biopsies that can be performed. Fine-Needle Aspiration A thin needle will be attached to a syringe and inserted into a breast cyst. Fluid and cells will be removed. This technique is not as common as a  core needle biopsy. Core Needle Biopsy A wide, hollow needle (core needle) will be inserted into a breast lump multiple times to remove tissue samples or cores. Stereotactic Biopsy You will lie face-down on a table. Your breast will pass through an opening in the table and will be gently  compressed into a fixed position. X-ray equipment and a computer will be used to locate the breast lump. The surgeon will use this information to collect several samples of tissue using a needle collection device. Vacuum-Assisted Biopsy A small incision (less than  inch) will be made in your breast. A biopsy device that includes a hollow needle and vacuum will be passed through the incision and into the breast tissue. The vacuum will gently draw abnormal breast tissue into the needle to remove it. No stitches (sutures) will be needed. The incision will be covered with a bandage (dressing). In this type of biopsy, a larger tissue sample is removed than in a regular core needle biopsy. Ultrasound-Guided Core Needle Biopsy A high-frequency ultrasound will be used to help guide the core needle to the area of the mass or abnormality. An incision will be made to insert the needle. Then tissue samples will be removed. Surgical Biopsy This method requires an incision in the breast to remove part or all of the suspicious tissue. After the tissue is removed, the skin over the area will be closed with sutures and covered with a dressing. There are two types of surgical biopsies:  Incisional biopsy. The surgeon will remove part of the breast lump.  Excisional biopsy. The surgeon will attempt to remove the whole breast lump or as much of it as possible.  After any of these procedures, the tissue or liquid that was removed will be examined under a microscope. What happens after the procedure?  You will be taken to the recovery area. If you are doing well and have no problems, you will be allowed to go home.  You may notice bruising on your breast. This is normal.  You may have a pressure dressing applied on your breast for 24-48 hours. A pressure dressing is a bandage that is wrapped tightly around the chest to stop fluid from collecting underneath tissues. You may also be advised to wear a supportive bra  during this time.  Do not drive for 24 hours if you received a sedative. This information is not intended to replace advice given to you by your health care provider. Make sure you discuss any questions you have with your health care provider. Document Released: 05/05/2005 Document Revised: 09/13/2015 Document Reviewed: 02/06/2015 Elsevier Interactive Patient Education  [employer].

## 2017-09-24 NOTE — Progress Notes (Signed)
Victoria Vasquez; [id number]; [birth year]   HPI Patient is an 84 year old white female who was referred to my care by Dr. Sallee Lange for evaluation treatment of an abnormal mammogram of the left breast.  Patient states that she has had an abnormal left nipple for some time now and occasionally it bleeds.  She did have a mammogram and there was the suggestion that she has Paget's disease of the left breast.  No mass has been noted.  There is no family history of breast cancer.  Patient denies any left breast pain. Past Medical History:  Diagnosis Date  . Asthma   . Chest pain    Evaluation prior to 2003 reportedly including cath-records never located; Echo in 2009: Mild LVH, LAE2, nl EF, moderate LAE, MR1-2  . Chronic kidney disease   . COPD (chronic obstructive pulmonary disease) (HCC)    Exertional dyspnea; PFTs in 2002: Moderate small airway obstruction, mildly reduced lung volumes and DLCO, nl ABG  . Diabetes mellitus, type II ([place])   . Fatigue   . Hyperlipidemia   . Hypertension   . Legally blind 1 1 2015  . Macular degeneration of both eyes 1 1 2015  . Parotid mass 2003   Right-2003  . Scoliosis   . Shingles     Past Surgical History:  Procedure Laterality Date  . APPENDECTOMY  1951  . CHOLECYSTECTOMY  2003  . COLONOSCOPY  04/16/2006   [facility]-Normal colon.  Exam limited due to retained particulate matter in the lumen of the colon, polyps less than 1 cm would have been easily missed.  Otherwise, no polyps, masses, inflammatory changes or vascular ectasia seen/ Normal retroflexed view of the rectum..  recommend repeat exam at 03/2011  . ORIF  1996  . ORIF ANKLE FRACTURE  1992    Family History  Problem Relation Age of Onset  . Stomach cancer Mother   . Alzheimer's disease Sister   . Colon cancer Neg Hx     Current Outpatient Medications on File Prior to Visit  Medication Sig Dispense Refill  . aspirin 81 MG tablet Take 81 mg by mouth daily.    . budesonide-formoterol  (SYMBICORT) 160-4.5 MCG/ACT inhaler USE 2 PUFFS TWICE DAILY FOR ASTHMA. RINSE MOUTH AFTER USE. 10.2 g 5  . glipiZIDE (GLUCOTROL) 5 MG tablet TAKE 1 TABLET BY MOUTH EACH MORNING AND 1/2 TABLET AT SUPPER. 45 tablet 5  . glucose blood (ONE TOUCH TEST STRIPS) test strip Use as instructed to test blood glucose 4 times a day 100 each 3  . latanoprost (XALATAN) 0.005 % ophthalmic solution Place 1 drop into the right eye at bedtime.    Marland Kitchen losartan (COZAAR) 25 MG tablet Take 1 tablet (25 mg total) by mouth daily. 90 tablet 1  . Multiple Vitamins-Minerals (ICAPS) CAPS Take by mouth 2 (two) times daily. Reported on 07/05/2015    . mupirocin ointment (BACTROBAN) 2 % Apply thin amount on affected area daily 22 g 1  . ONE TOUCH ULTRA TEST test strip USE TO TEST FOUR TIMES DAILY. 150 each 5   No current facility-administered medications on file prior to visit.     Allergies  Allergen Reactions  . Statins     Side effects- myalgias  . Lisinopril Cough    cough    Social History   Substance and Sexual Activity  Alcohol Use No    Social History   Tobacco Use  Smoking Status Passive Smoke Exposure - Never Smoker  Smokeless Tobacco Never  Used    Review of Systems  Constitutional: Positive for malaise/fatigue.  HENT: Positive for sinus pain.   Eyes: Negative.   Respiratory: Positive for cough and shortness of breath.   Cardiovascular: Negative.   Gastrointestinal: Negative.   Genitourinary: Negative.   Musculoskeletal: Positive for back pain.  Skin: Negative.   Neurological: Positive for dizziness.  Endo/Heme/Allergies: Negative.   Psychiatric/Behavioral: Negative.     Objective   Vitals:   09/24/17 1100  BP: (!) 204/99  Pulse: 68  Resp: 16  Temp: 98.7 F (37.1 C)    Physical Exam  Constitutional: She is oriented to person, place, and time. She appears well-developed and well-nourished.  HENT:  Head: Normocephalic and atraumatic.  Cardiovascular: Normal rate, regular rhythm  and normal heart sounds. Exam reveals no gallop and no friction rub.  No murmur heard. Pulmonary/Chest: Effort normal and breath sounds normal. No stridor. No respiratory distress. She has no wheezes. She has no rales.  Neurological: She is alert and oriented to person, place, and time.  Vitals reviewed. Breast: Left breast with deformed left nipple with pink, exudative material present.  Irregular margins are noted.  No dominant masses noted.  The left axilla is negative for palpable nodes.  Right breast examination reveals no dominant mass, nipple discharge, dimpling.  The axilla was negative for palpable nodes.  Mammogram results reviewed Dr. Colleen Can notes reviewed  Assessment  Abnormality of left nipple, highly suggestive of Paget's disease Hypertension Plan   Patient is scheduled for left breast biopsy on 09/28/2017.  In reviewing her records, the patient is on blood pressure medication but does run hypertensive.  My intention is to have anesthesia present, but primarily localized the biopsy site.  The risks and benefits of the procedure were fully explained to the patient, who gave informed consent.

## 2017-09-25 ENCOUNTER — Ambulatory Visit (HOSPITAL_COMMUNITY)
Admission: RE | Admit: 2017-09-25 | Discharge: 2017-09-25 | Disposition: A | Discharge status: Home / Self Care | Payer: PPO | Source: Ambulatory Visit | Attending: General Surgery | Admitting: General Surgery

## 2017-09-25 ENCOUNTER — Other Ambulatory Visit: Payer: Self-pay

## 2017-09-25 ENCOUNTER — Encounter (HOSPITAL_COMMUNITY): Payer: Self-pay

## 2017-09-25 ENCOUNTER — Encounter (HOSPITAL_COMMUNITY)
Admission: RE | Admit: 2017-09-25 | Discharge: 2017-09-25 | Disposition: A | Discharge status: Home / Self Care | Payer: PPO | Source: Ambulatory Visit | Attending: General Surgery | Admitting: General Surgery

## 2017-09-25 ENCOUNTER — Telehealth: Payer: Self-pay | Admitting: Family Medicine

## 2017-09-25 ENCOUNTER — Other Ambulatory Visit: Payer: Self-pay | Admitting: Family Medicine

## 2017-09-25 DIAGNOSIS — N6320 Unspecified lump in the left breast, unspecified quadrant: Secondary | ICD-10-CM | POA: Diagnosis not present

## 2017-09-25 DIAGNOSIS — R9431 Abnormal electrocardiogram [ECG] [EKG]: Secondary | ICD-10-CM | POA: Diagnosis not present

## 2017-09-25 DIAGNOSIS — Z01812 Encounter for preprocedural laboratory examination: Secondary | ICD-10-CM | POA: Diagnosis not present

## 2017-09-25 DIAGNOSIS — R918 Other nonspecific abnormal finding of lung field: Secondary | ICD-10-CM | POA: Diagnosis not present

## 2017-09-25 DIAGNOSIS — I4510 Unspecified right bundle-branch block: Secondary | ICD-10-CM | POA: Insufficient documentation

## 2017-09-25 DIAGNOSIS — Z01818 Encounter for other preprocedural examination: Secondary | ICD-10-CM | POA: Diagnosis not present

## 2017-09-25 DIAGNOSIS — E119 Type 2 diabetes mellitus without complications: Secondary | ICD-10-CM | POA: Diagnosis not present

## 2017-09-25 DIAGNOSIS — R079 Chest pain, unspecified: Secondary | ICD-10-CM

## 2017-09-25 DIAGNOSIS — M79674 Pain in right toe(s): Secondary | ICD-10-CM | POA: Diagnosis not present

## 2017-09-25 DIAGNOSIS — L97511 Non-pressure chronic ulcer of other part of right foot limited to breakdown of skin: Secondary | ICD-10-CM | POA: Diagnosis not present

## 2017-09-25 HISTORY — DX: Dyspnea, unspecified: R06.00

## 2017-09-25 HISTORY — DX: Unspecified osteoarthritis, unspecified site: M19.90

## 2017-09-25 HISTORY — DX: Gastro-esophageal reflux disease without esophagitis: K21.9

## 2017-09-25 LAB — GLUCOSE, CAPILLARY: Glucose-Capillary: 199 mg/dL — ABNORMAL HIGH (ref 65–99)

## 2017-09-25 LAB — CBC WITH DIFFERENTIAL/PLATELET
Basophils Absolute: 0 10*3/uL (ref 0.0–0.1)
Basophils Relative: 1 %
EOS PCT: 3 %
Eosinophils Absolute: 0.2 10*3/uL (ref 0.0–0.7)
HCT: 40.1 % (ref 36.0–46.0)
Hemoglobin: 12.8 g/dL (ref 12.0–15.0)
LYMPHS ABS: 2.2 10*3/uL (ref 0.7–4.0)
LYMPHS PCT: 37 %
MCH: 30.2 pg (ref 26.0–34.0)
MCHC: 31.9 g/dL (ref 30.0–36.0)
MCV: 94.6 fL (ref 78.0–100.0)
MONO ABS: 0.8 10*3/uL (ref 0.1–1.0)
MONOS PCT: 13 %
Neutro Abs: 2.7 10*3/uL (ref 1.7–7.7)
Neutrophils Relative %: 46 %
PLATELETS: 181 10*3/uL (ref 150–400)
RBC: 4.24 MIL/uL (ref 3.87–5.11)
RDW: 13.7 % (ref 11.5–15.5)
WBC: 6 10*3/uL (ref 4.0–10.5)

## 2017-09-25 LAB — COMPREHENSIVE METABOLIC PANEL
ALK PHOS: 66 U/L (ref 38–126)
ALT: 12 U/L — AB (ref 14–54)
AST: 19 U/L (ref 15–41)
Albumin: 4.3 g/dL (ref 3.5–5.0)
Anion gap: 10 (ref 5–15)
BILIRUBIN TOTAL: 0.7 mg/dL (ref 0.3–1.2)
BUN: 32 mg/dL — ABNORMAL HIGH (ref 6–20)
CALCIUM: 9.4 mg/dL (ref 8.9–10.3)
CO2: 27 mmol/L (ref 22–32)
CREATININE: 1.35 mg/dL — AB (ref 0.44–1.00)
Chloride: 105 mmol/L (ref 101–111)
GFR calc Af Amer: 40 mL/min — ABNORMAL LOW (ref >60)
GFR calc non Af Amer: 34 mL/min — ABNORMAL LOW (ref >60)
GLUCOSE: 208 mg/dL — AB (ref 65–99)
Potassium: 4.1 mmol/L (ref 3.5–5.1)
Sodium: 142 mmol/L (ref 135–145)
TOTAL PROTEIN: 7.1 g/dL (ref 6.5–8.1)

## 2017-09-25 LAB — HEMOGLOBIN A1C
Hgb A1c MFr Bld: 7.7 % — ABNORMAL HIGH (ref 4.8–5.6)
Mean Plasma Glucose: 174.29 mg/dL

## 2017-09-25 NOTE — Telephone Encounter (Signed)
Left message to return call; urgent referral placed

## 2017-09-25 NOTE — Telephone Encounter (Signed)
1.  Please let the patient know that I did speak with Dr. Aviva Signs about her recent EKG.  Let her know I looked at the EKG.  It does have some abnormal areas to it that require consultation with cardiologist before they will let her do the biopsy.  Reassure her that this is a commonly encountered issue and that she should be able to be approved for the biopsy but will need to see cardiology first.  Put in urgent consultation for cardiology.  Please notify cardiology that this patient is pending biopsy surgery for possible cancer and needs cardiology clearance because of abnormal EKG before they can do biopsy.  Hopefully this can move along fairly fast.  At

## 2017-09-25 NOTE — Telephone Encounter (Signed)
Phone message per provider request. Abnormal EKG; postponing biopsy

## 2017-09-25 NOTE — Pre-Procedure Instructions (Signed)
EKG reviewed with Dr Hilaria Ota and due to EKG changes and no cardiac work up for several years, he feels patient needs to see cardiology before procedure is done. I called Dr Arnoldo Morale to notify him of above and he is going to contact PCP to let him know of above recommendation. Procedure to be postponed for now. I called patient's daughter, Donne Anon and spoke with her about all of the above. She verbalizes understanding of above and will await call from Dr Arnoldo Morale, or PCP.

## 2017-09-28 ENCOUNTER — Encounter (HOSPITAL_COMMUNITY): Admission: RE | Payer: Self-pay | Source: Ambulatory Visit

## 2017-09-28 ENCOUNTER — Ambulatory Visit (INDEPENDENT_AMBULATORY_CARE_PROVIDER_SITE_OTHER): Payer: PPO | Admitting: Cardiovascular Disease

## 2017-09-28 ENCOUNTER — Ambulatory Visit (HOSPITAL_COMMUNITY): Admission: RE | Admit: 2017-09-28 | Payer: PPO | Source: Ambulatory Visit | Admitting: General Surgery

## 2017-09-28 ENCOUNTER — Telehealth: Payer: Self-pay | Admitting: Family Medicine

## 2017-09-28 ENCOUNTER — Encounter: Payer: Self-pay | Admitting: Cardiovascular Disease

## 2017-09-28 VITALS — BP 138/90 | HR 61 | Ht 63.0 in | Wt 180.0 lb

## 2017-09-28 DIAGNOSIS — R9431 Abnormal electrocardiogram [ECG] [EKG]: Secondary | ICD-10-CM | POA: Diagnosis not present

## 2017-09-28 DIAGNOSIS — I10 Essential (primary) hypertension: Secondary | ICD-10-CM

## 2017-09-28 DIAGNOSIS — I4510 Unspecified right bundle-branch block: Secondary | ICD-10-CM

## 2017-09-28 SURGERY — BREAST BIOPSY
Anesthesia: General | Laterality: Left

## 2017-09-28 NOTE — Telephone Encounter (Signed)
Patient saw cardiology today.  Note is in epic.  Cleared by cardiology for biopsy.  Thanks

## 2017-09-28 NOTE — Progress Notes (Signed)
CARDIOLOGY CONSULT NOTE  Patient ID: KEVIN MARIO MRN: [id number] DOB/AGE: 1927/10/26 84 y.o.  Admit date: (Not on file) Primary Physician: Kathyrn Drown, MD Referring Physician: Kathyrn Drown, MD  Reason for Consultation: Abnormal EKG  HPI: Victoria Vasquez is a 84 y.o. female who is being seen today for the evaluation of abnormal EKG at the request of Luking, Elayne Snare, MD.  Past medical history includes hypertension, GERD, and type 2 diabetes mellitus.  She recently had an abnormal mammogram with abnormality of left nipple highly suggestive of Paget's disease as per Dr. Arnoldo Morale note on 09/24/2017 which I reviewed.  She is being scheduled for a left breast biopsy.  She underwent an unremarkable nuclear stress test on 10/01/2012 which I reviewed.  I reviewed an echocardiogram performed on 09/15/2012 which demonstrated low normal left ventricular systolic function, EF 50 to 55%, with moderate hypokinesis of the inferolateral myocardium.  There is moderate right ventricular hypertrophy.  I personally reviewed the ECG performed on 09/25/2017 which demonstrated sinus rhythm with a right bundle branch.  She seldom has chest pains lasting seconds.  She denies a history of MI and CVA.  She has COPD which she developed in the past for years and has a lifelong history of asthma.  She is bothered by seasonal allergies and postnasal drip.  She has a dry cough.  She denies orthopnea, palpitations, and paroxysmal nocturnal dyspnea.  She is here with her daughter.   Allergies  Allergen Reactions  . Statins     Side effects- myalgias  . Lisinopril Cough    cough    Current Outpatient Medications  Medication Sig Dispense Refill  . acetaminophen (TYLENOL) 650 MG CR tablet Take 650 mg by mouth every 8 (eight) hours as needed for pain.    Marland Kitchen aspirin 81 MG tablet Take 81 mg by mouth daily.    . budesonide-formoterol (SYMBICORT) 160-4.5 MCG/ACT inhaler USE 2 PUFFS TWICE DAILY FOR ASTHMA.  RINSE MOUTH AFTER USE. 10.2 g 5  . glipiZIDE (GLUCOTROL) 5 MG tablet TAKE 1 TABLET BY MOUTH EACH MORNING AND 1/2 TABLET AT SUPPER. 45 tablet 5  . latanoprost (XALATAN) 0.005 % ophthalmic solution Place 1 drop into the right eye at bedtime.    Marland Kitchen losartan (COZAAR) 25 MG tablet Take 1 tablet (25 mg total) by mouth daily. 90 tablet 1  . Multiple Vitamins-Minerals (PRESERVISION AREDS 2+MULTI VIT PO) Take 1 tablet by mouth daily.    . mupirocin ointment (BACTROBAN) 2 % Apply thin amount on affected area daily 22 g 1  . pantoprazole (PROTONIX) 40 MG tablet Take 40 mg by mouth daily.     No current facility-administered medications for this visit.     Past Medical History:  Diagnosis Date  . Arthritis   . Asthma   . Chest pain    Evaluation prior to 2003 reportedly including cath-records never located; Echo in 2009: Mild LVH, LAE2, nl EF, moderate LAE, MR1-2  . Chronic kidney disease   . COPD (chronic obstructive pulmonary disease) (HCC)    Exertional dyspnea; PFTs in 2002: Moderate small airway obstruction, mildly reduced lung volumes and DLCO, nl ABG  . Diabetes mellitus, type II ([place])   . Dyspnea   . Fatigue   . GERD (gastroesophageal reflux disease)   . Hyperlipidemia   . Hypertension   . Legally blind 1 1 2015  . Macular degeneration of both eyes 1 1 2015  . Parotid mass 2003  Right-2003  . Scoliosis   . Shingles     Past Surgical History:  Procedure Laterality Date  . APPENDECTOMY  1951  . CHOLECYSTECTOMY  2003  . COLONOSCOPY  04/16/2006   [place]-Normal colon.  Exam limited due to retained particulate matter in the lumen of the colon, polyps less than 1 cm would have been easily missed.  Otherwise, no polyps, masses, inflammatory changes or vascular ectasia seen/ Normal retroflexed view of the rectum..  recommend repeat exam at 03/2011  . ORIF  1996  . ORIF ANKLE FRACTURE  1992    Social History   Socioeconomic History  . Marital status: Widowed    Spouse name: Not on  file  . Number of children: 3  . Years of education: Not on file  . Highest education level: Not on file  Occupational History  . Occupation: Retired  Scientific laboratory technician  . Financial resource strain: Not on file  . Food insecurity:    Worry: Not on file    Inability: Not on file  . Transportation needs:    Medical: Not on file    Non-medical: Not on file  Tobacco Use  . Smoking status: Passive Smoke Exposure - Never Smoker  . Smokeless tobacco: Never Used  Substance and Sexual Activity  . Alcohol use: No  . Drug use: No  . Sexual activity: Not on file  Lifestyle  . Physical activity:    Days per week: Not on file    Minutes per session: Not on file  . Stress: Not on file  Relationships  . Social connections:    Talks on phone: Not on file    Gets together: Not on file    Attends religious service: Not on file    Active member of club or organization: Not on file    Attends meetings of clubs or organizations: Not on file    Relationship status: Not on file  . Intimate partner violence:    Fear of current or ex partner: Not on file    Emotionally abused: Not on file    Physically abused: Not on file    Forced sexual activity: Not on file  Other Topics Concern  . Not on file  Social History Narrative   Widowed   3 children   No regular exercise     No family history of premature CAD in 1st degree relatives.  Current Meds  Medication Sig  . acetaminophen (TYLENOL) 650 MG CR tablet Take 650 mg by mouth every 8 (eight) hours as needed for pain.  Marland Kitchen aspirin 81 MG tablet Take 81 mg by mouth daily.  . budesonide-formoterol (SYMBICORT) 160-4.5 MCG/ACT inhaler USE 2 PUFFS TWICE DAILY FOR ASTHMA. RINSE MOUTH AFTER USE.  . glipiZIDE (GLUCOTROL) 5 MG tablet TAKE 1 TABLET BY MOUTH EACH MORNING AND 1/2 TABLET AT SUPPER.  Marland Kitchen latanoprost (XALATAN) 0.005 % ophthalmic solution Place 1 drop into the right eye at bedtime.  Marland Kitchen losartan (COZAAR) 25 MG tablet Take 1 tablet (25 mg total) by  mouth daily.  . Multiple Vitamins-Minerals (PRESERVISION AREDS 2+MULTI VIT PO) Take 1 tablet by mouth daily.  . mupirocin ointment (BACTROBAN) 2 % Apply thin amount on affected area daily  . pantoprazole (PROTONIX) 40 MG tablet Take 40 mg by mouth daily.      Review of systems complete and found to be negative unless listed above in HPI    Physical exam Blood pressure 138/90, pulse 61, height 5\' 3"  (1.6 m), weight 180  lb (81.6 kg), SpO2 97 %. General: NAD, elderly female Neck: No JVD, no thyromegaly or thyroid nodule.  Lungs: Clear to auscultation bilaterally with normal respiratory effort. CV: Nondisplaced PMI. Regular rate and rhythm, normal S1/S2, no S3/S4, no murmur.  Trace left peri-ankle edema.  Abdomen: Soft, nontender, no distention.  Skin: Intact without lesions or rashes.  Neurologic: Alert and oriented x 3.  Psych: Normal affect. Extremities: No clubbing or cyanosis.  HEENT: Normal.   ECG: Most recent ECG reviewed.   Labs: Lab Results  Component Value Date/Time   K 4.1 09/25/2017 10:18 AM   BUN 32 (H) 09/25/2017 10:18 AM   BUN 23 09/02/2017 11:18 AM   CREATININE 1.35 (H) 09/25/2017 10:18 AM   CREATININE 1.05 08/17/2013 08:46 AM   ALT 12 (L) 09/25/2017 10:18 AM   TSH 1.604 09/08/2012 02:29 PM   HGB 12.8 09/25/2017 10:18 AM   HGB 12.9 01/24/2016 11:01 AM     Lipids: Lab Results  Component Value Date/Time   LDLCALC 156 (H) 07/29/2017 10:36 AM   LDLCALC 100 09/04/2008   CHOL 233 (H) 07/29/2017 10:36 AM   TRIG 123 07/29/2017 10:36 AM   TRIG 177 09/04/2008   HDL 52 07/29/2017 10:36 AM        ASSESSMENT AND PLAN:  1.  Abnormal EKG/right bundle branch block: There is no history of dizziness or syncope and no evidence of advanced conduction system disease.  No preoperative cardiac testing is indicated at this time.  She can proceed with breast biopsy as planned.  2.  Hypertension: Blood pressure is reasonably controlled for age.  No changes to  therapy.   Disposition: Follow up as needed   Signed: Kate Sable, M.D., F.A.C.C.  09/28/2017, 4:19 PM

## 2017-09-28 NOTE — Telephone Encounter (Signed)
Discussed with pt. Pt verbalized understanding. Pt states she saw cardiologist today.

## 2017-09-28 NOTE — Patient Instructions (Signed)
Your physician recommends that you schedule a follow-up appointment in: AS NEEDED WITH DR KONESWARAN  Your physician recommends that you continue on your current medications as directed. Please refer to the Current Medication list given to you today.  Thank you for choosing [facility]!!    

## 2017-09-29 ENCOUNTER — Other Ambulatory Visit: Payer: Self-pay | Admitting: Family Medicine

## 2017-09-29 NOTE — Patient Instructions (Signed)
ELFRIDA PIXLEY  09/29/2017     @PREFPERIOPPHARMACY @   Your procedure is scheduled on 10/05/2017.  Report to [facility] at 6:15 A.M.  Call this number if you have problems the morning of surgery:  [phone]   Remember:  Do not eat food or drink liquids after midnight.  Take these medicines the morning of surgery with A SIP OF WATER : Cozaar and Protonix   Do not wear jewelry, make-up or nail polish.  Do not wear lotions, powders, or perfumes, or deodorant.  Do not shave 48 hours prior to surgery.  Men may shave face and neck.  Do not bring valuables to the hospital.  [facility] is not responsible for any belongings or valuables.  Contacts, dentures or bridgework may not be worn into surgery.  Leave your suitcase in the car.  After surgery it may be brought to your room.  For patients admitted to the hospital, discharge time will be determined by your treatment team.  Patients discharged the day of surgery will not be allowed to drive home.   Name and phone number of your driver:   family Special instructions:  n/a  Please read over the following fact sheets that you were given. Care and Recovery After Surgery   PATIENT INSTRUCTIONS POST-ANESTHESIA  IMMEDIATELY FOLLOWING SURGERY:  Do not drive or operate machinery for the first twenty four hours after surgery.  Do not make any important decisions for twenty four hours after surgery or while taking narcotic pain medications or sedatives.  If you develop intractable nausea and vomiting or a severe headache please notify your doctor immediately.  FOLLOW-UP:  Please make an appointment with your surgeon as instructed. You do not need to follow up with anesthesia unless specifically instructed to do so.  WOUND CARE INSTRUCTIONS (if applicable):  Keep a dry clean dressing on the anesthesia/puncture wound site if there is drainage.  Once the wound has quit draining you may leave it open to air.  Generally you should leave the  bandage intact for twenty four hours unless there is drainage.  If the epidural site drains for more than 36-48 hours please call the anesthesia department.  QUESTIONS?:  Please feel free to call your physician or the hospital operator if you have any questions, and they will be happy to assist you.       Breast Biopsy A breast biopsy is a procedure in which a sample of suspicious breast tissue is removed from your breast. Following the procedure, the tissue or liquid that is removed from the breast is examined under a microscope to see if cancerous cells are present. You may need a breast biopsy if you have:  Any undiagnosed breast mass (tumor).  Nipple abnormalities, dimpling, crusting, or ulcerations.  Abnormal discharge from the nipple, especially blood.  Redness, swelling, and pain of the breast.  Calcium deposits (calcifications) or abnormalities seen on a mammogram, ultrasound results, or MRI results.  Suspicious changes in the breast seen on your mammogram.  If the breast abnormality is found to be cancerous (malignant), a breast biopsy can help to determine what the best treatment is for you. There are many different types of breast biopsies. Talk with your health care provider about your options and which type is best for you. Tell a health care provider about:  Any allergies you have.  All medicines you are taking, including vitamins, herbs, eye drops, creams, and over-the-counter medicines.  Any problems you or family members have  had with anesthetic medicines.  Any blood disorders you have.  Any surgeries you have had.  Any medical conditions you have.  Whether you are pregnant or may be pregnant. What are the risks? Generally, this is a safe procedure. However, problems may occur, including:  Bleeding.  Infection.  Discomfort. This is temporary.  Allergic reactions to medicines.  Bruising and swelling of the breast.  Alteration in the shape of the  breast.  Damage to other tissues.  Not finding the lump or abnormality.  Needing more surgery.  What happens before the procedure?  Plan to have someone take you home after the procedure.  Do not use any tobacco products, such as cigarettes, chewing tobacco, and e-cigarettes. If you need help quitting, ask your health care provider.  Do not drink alcohol for 24 hours before the procedure.  Ask your health care provider about: ? Changing or stopping your regular medicines. This is especially important if you are taking diabetes medicines or blood thinners. ? Taking medicines such as aspirin and ibuprofen. These medicines can thin your blood. Do not take these medicines before your procedure if your health care provider instructs you not to.  Wear a good support bra to the procedure.  Ask your health care provider how your surgical site will be marked or identified.  You may be given antibiotic medicine to help prevent infection.  Your health care provider may perform a procedure to place a wire (needle localization) or a seed that gives off radiation (radioactive seed localization) in the breast lump. A mammogram, ultrasound, MRI, or a combination of these techniques will be done during this procedure to identify the location of the breast abnormality. The imaging technique used will depend on the type of biopsy you are having. The wire or seed will help the health care provider locate the lump when performing the biopsy, especially if the lump cannot be felt. What happens during the procedure? You may be given one or both of the following:  A medicine to numb the breast area (local anesthetic).  A medicine to help you relax (sedative) during the procedure.  The following are the different types of biopsies that can be performed. Fine-Needle Aspiration A thin needle will be attached to a syringe and inserted into a breast cyst. Fluid and cells will be removed. This technique is not  as common as a core needle biopsy. Core Needle Biopsy A wide, hollow needle (core needle) will be inserted into a breast lump multiple times to remove tissue samples or cores. Stereotactic Biopsy You will lie face-down on a table. Your breast will pass through an opening in the table and will be gently compressed into a fixed position. X-ray equipment and a computer will be used to locate the breast lump. The surgeon will use this information to collect several samples of tissue using a needle collection device. Vacuum-Assisted Biopsy A small incision (less than  inch) will be made in your breast. A biopsy device that includes a hollow needle and vacuum will be passed through the incision and into the breast tissue. The vacuum will gently draw abnormal breast tissue into the needle to remove it. No stitches (sutures) will be needed. The incision will be covered with a bandage (dressing). In this type of biopsy, a larger tissue sample is removed than in a regular core needle biopsy. Ultrasound-Guided Core Needle Biopsy A high-frequency ultrasound will be used to help guide the core needle to the area of the mass or  abnormality. An incision will be made to insert the needle. Then tissue samples will be removed. Surgical Biopsy This method requires an incision in the breast to remove part or all of the suspicious tissue. After the tissue is removed, the skin over the area will be closed with sutures and covered with a dressing. There are two types of surgical biopsies:  Incisional biopsy. The surgeon will remove part of the breast lump.  Excisional biopsy. The surgeon will attempt to remove the whole breast lump or as much of it as possible.  After any of these procedures, the tissue or liquid that was removed will be examined under a microscope. What happens after the procedure?  You will be taken to the recovery area. If you are doing well and have no problems, you will be allowed to go  home.  You may notice bruising on your breast. This is normal.  You may have a pressure dressing applied on your breast for 24-48 hours. A pressure dressing is a bandage that is wrapped tightly around the chest to stop fluid from collecting underneath tissues. You may also be advised to wear a supportive bra during this time.  Do not drive for 24 hours if you received a sedative. This information is not intended to replace advice given to you by your health care provider. Make sure you discuss any questions you have with your health care provider. Document Released: 05/05/2005 Document Revised: 09/13/2015 Document Reviewed: 02/06/2015 Elsevier Interactive Patient Education  [employer].

## 2017-10-01 ENCOUNTER — Encounter (HOSPITAL_COMMUNITY)
Admission: RE | Admit: 2017-10-01 | Discharge: 2017-10-01 | Disposition: A | Discharge status: Home / Self Care | Payer: PPO | Source: Ambulatory Visit | Attending: General Surgery | Admitting: General Surgery

## 2017-10-05 ENCOUNTER — Encounter (HOSPITAL_COMMUNITY): Payer: Self-pay | Admitting: Anesthesiology

## 2017-10-05 ENCOUNTER — Ambulatory Visit (HOSPITAL_COMMUNITY)
Admission: RE | Admit: 2017-10-05 | Discharge: 2017-10-05 | Disposition: A | Discharge status: Home / Self Care | Payer: PPO | Source: Ambulatory Visit | Attending: General Surgery | Admitting: General Surgery

## 2017-10-05 ENCOUNTER — Encounter (HOSPITAL_COMMUNITY)
Admission: RE | Disposition: A | Discharge status: Home / Self Care | Payer: Self-pay | Source: Ambulatory Visit | Attending: General Surgery

## 2017-10-05 ENCOUNTER — Ambulatory Visit (HOSPITAL_COMMUNITY): Payer: PPO | Admitting: Anesthesiology

## 2017-10-05 DIAGNOSIS — Z7982 Long term (current) use of aspirin: Secondary | ICD-10-CM | POA: Diagnosis not present

## 2017-10-05 DIAGNOSIS — E785 Hyperlipidemia, unspecified: Secondary | ICD-10-CM | POA: Diagnosis not present

## 2017-10-05 DIAGNOSIS — C50012 Malignant neoplasm of nipple and areola, left female breast: Secondary | ICD-10-CM

## 2017-10-05 DIAGNOSIS — E1122 Type 2 diabetes mellitus with diabetic chronic kidney disease: Secondary | ICD-10-CM | POA: Insufficient documentation

## 2017-10-05 DIAGNOSIS — Z888 Allergy status to other drugs, medicaments and biological substances status: Secondary | ICD-10-CM | POA: Diagnosis not present

## 2017-10-05 DIAGNOSIS — N189 Chronic kidney disease, unspecified: Secondary | ICD-10-CM | POA: Insufficient documentation

## 2017-10-05 DIAGNOSIS — M419 Scoliosis, unspecified: Secondary | ICD-10-CM | POA: Insufficient documentation

## 2017-10-05 DIAGNOSIS — Z7722 Contact with and (suspected) exposure to environmental tobacco smoke (acute) (chronic): Secondary | ICD-10-CM | POA: Diagnosis not present

## 2017-10-05 DIAGNOSIS — I129 Hypertensive chronic kidney disease with stage 1 through stage 4 chronic kidney disease, or unspecified chronic kidney disease: Secondary | ICD-10-CM | POA: Diagnosis not present

## 2017-10-05 DIAGNOSIS — M889 Osteitis deformans of unspecified bone: Secondary | ICD-10-CM | POA: Diagnosis not present

## 2017-10-05 DIAGNOSIS — J449 Chronic obstructive pulmonary disease, unspecified: Secondary | ICD-10-CM | POA: Insufficient documentation

## 2017-10-05 DIAGNOSIS — Z7984 Long term (current) use of oral hypoglycemic drugs: Secondary | ICD-10-CM | POA: Insufficient documentation

## 2017-10-05 DIAGNOSIS — J45909 Unspecified asthma, uncomplicated: Secondary | ICD-10-CM | POA: Diagnosis not present

## 2017-10-05 DIAGNOSIS — I10 Essential (primary) hypertension: Secondary | ICD-10-CM | POA: Diagnosis not present

## 2017-10-05 DIAGNOSIS — Z79899 Other long term (current) drug therapy: Secondary | ICD-10-CM | POA: Insufficient documentation

## 2017-10-05 DIAGNOSIS — N6320 Unspecified lump in the left breast, unspecified quadrant: Secondary | ICD-10-CM | POA: Diagnosis not present

## 2017-10-05 HISTORY — PX: BREAST BIOPSY: SHX20

## 2017-10-05 LAB — GLUCOSE, CAPILLARY
GLUCOSE-CAPILLARY: 126 mg/dL — AB (ref 65–99)
Glucose-Capillary: 145 mg/dL — ABNORMAL HIGH (ref 65–99)

## 2017-10-05 SURGERY — BREAST BIOPSY
Anesthesia: Monitor Anesthesia Care | Site: Breast | Laterality: Left

## 2017-10-05 MED ORDER — HYDROCODONE-ACETAMINOPHEN 7.5-325 MG PO TABS: 1.0000 | ORAL_TABLET | Freq: Once | ORAL | Status: DC | PRN

## 2017-10-05 MED ORDER — LIDOCAINE HCL (PF) 1 % IJ SOLN
INTRAMUSCULAR | Status: DC | PRN
  Administered 2017-10-05: 6 mL

## 2017-10-05 MED ORDER — BUPIVACAINE HCL (PF) 0.5 % IJ SOLN
INTRAMUSCULAR | Status: AC
  Filled 2017-10-05: qty 30

## 2017-10-05 MED ORDER — CHLORHEXIDINE GLUCONATE CLOTH 2 % EX PADS: 6.0000 | MEDICATED_PAD | Freq: Once | CUTANEOUS | Status: DC

## 2017-10-05 MED ORDER — TRAMADOL HCL 50 MG PO TABS: 50.0000 mg | ORAL_TABLET | Freq: Four times a day (QID) | ORAL | 0 refills | Status: DC | PRN

## 2017-10-05 MED ORDER — 0.9 % SODIUM CHLORIDE (POUR BTL) OPTIME
TOPICAL | Status: DC | PRN
  Administered 2017-10-05: 1000 mL

## 2017-10-05 MED ORDER — HYDROMORPHONE HCL 1 MG/ML IJ SOLN: 0.2500 mg | INTRAMUSCULAR | Status: DC | PRN

## 2017-10-05 MED ORDER — MEPERIDINE HCL 50 MG/ML IJ SOLN: 6.2500 mg | INTRAMUSCULAR | Status: DC | PRN

## 2017-10-05 MED ORDER — PROPOFOL 10 MG/ML IV BOLUS
INTRAVENOUS | Status: DC | PRN
  Administered 2017-10-05: 30 mg via INTRAVENOUS
  Administered 2017-10-05: 10 mg via INTRAVENOUS

## 2017-10-05 MED ORDER — KETOROLAC TROMETHAMINE 30 MG/ML IJ SOLN
15.0000 mg | Freq: Once | INTRAMUSCULAR | Status: AC
  Administered 2017-10-05: 15 mg via INTRAVENOUS

## 2017-10-05 MED ORDER — LIDOCAINE HCL 1 % IJ SOLN
INTRAMUSCULAR | Status: DC | PRN
  Administered 2017-10-05: 40 mg via INTRADERMAL

## 2017-10-05 MED ORDER — KETOROLAC TROMETHAMINE 30 MG/ML IJ SOLN
INTRAMUSCULAR | Status: AC
  Filled 2017-10-05: qty 1

## 2017-10-05 MED ORDER — ONDANSETRON HCL 4 MG/2ML IJ SOLN: 4.0000 mg | Freq: Once | INTRAMUSCULAR | Status: DC | PRN

## 2017-10-05 MED ORDER — PROPOFOL 500 MG/50ML IV EMUL
INTRAVENOUS | Status: DC | PRN
  Administered 2017-10-05: 100 ug/kg/min via INTRAVENOUS

## 2017-10-05 MED ORDER — LIDOCAINE HCL (PF) 1 % IJ SOLN
INTRAMUSCULAR | Status: AC
  Filled 2017-10-05: qty 30

## 2017-10-05 MED ORDER — KETOROLAC TROMETHAMINE 30 MG/ML IJ SOLN: 30.0000 mg | Freq: Once | INTRAMUSCULAR | Status: DC | PRN

## 2017-10-05 MED ORDER — LACTATED RINGERS IV SOLN
INTRAVENOUS | Status: DC
  Administered 2017-10-05: 1000 mL via INTRAVENOUS

## 2017-10-05 SURGICAL SUPPLY — 28 items
ADH SKN CLS APL DERMABOND .7 (GAUZE/BANDAGES/DRESSINGS) ×1
BLADE SURG 15 STRL LF DISP TIS (BLADE) ×1 IMPLANT
BLADE SURG 15 STRL SS (BLADE) ×3
CHLORAPREP W/TINT 26ML (MISCELLANEOUS) ×3 IMPLANT
CLOTH BEACON ORANGE TIMEOUT ST (SAFETY) ×3 IMPLANT
COVER LIGHT HANDLE STERIS (MISCELLANEOUS) ×6 IMPLANT
DECANTER SPIKE VIAL GLASS SM (MISCELLANEOUS) ×6 IMPLANT
DERMABOND ADVANCED (GAUZE/BANDAGES/DRESSINGS) ×2
DERMABOND ADVANCED .7 DNX12 (GAUZE/BANDAGES/DRESSINGS) ×1 IMPLANT
ELECT REM PT RETURN 9FT ADLT (ELECTROSURGICAL) ×3
ELECTRODE REM PT RTRN 9FT ADLT (ELECTROSURGICAL) ×1 IMPLANT
GLOVE BIOGEL PI IND STRL 7.0 (GLOVE) ×3 IMPLANT
GLOVE BIOGEL PI INDICATOR 7.0 (GLOVE) ×6
GLOVE ECLIPSE 6.5 STRL STRAW (GLOVE) ×3 IMPLANT
GLOVE SURG SS PI 7.5 STRL IVOR (GLOVE) ×3 IMPLANT
GOWN STRL REUS W/TWL LRG LVL3 (GOWN DISPOSABLE) ×6 IMPLANT
KIT TURNOVER KIT A (KITS) ×3 IMPLANT
MANIFOLD NEPTUNE II (INSTRUMENTS) ×3 IMPLANT
NEEDLE HYPO 25X1 1.5 SAFETY (NEEDLE) ×3 IMPLANT
NS IRRIG 1000ML POUR BTL (IV SOLUTION) ×3 IMPLANT
PACK MINOR (CUSTOM PROCEDURE TRAY) ×3 IMPLANT
PAD ARMBOARD 7.5X6 YLW CONV (MISCELLANEOUS) ×3 IMPLANT
SET BASIN LINEN APH (SET/KITS/TRAYS/PACK) ×3 IMPLANT
SUT MNCRL AB 4-0 PS2 18 (SUTURE) ×3 IMPLANT
SUT VIC AB 3-0 SH 27 (SUTURE) ×3
SUT VIC AB 3-0 SH 27X BRD (SUTURE) ×1 IMPLANT
SUT VIC AB 4-0 PS2 27 (SUTURE) ×3 IMPLANT
SYR CONTROL 10ML LL (SYRINGE) ×3 IMPLANT

## 2017-10-05 NOTE — Interval H&P Note (Signed)
History and Physical Interval Note:  10/05/2017 7:08 AM  Victoria Vasquez  has presented today for surgery, with the diagnosis of padgett's dx  The various methods of treatment have been discussed with the patient and family. After consideration of risks, benefits and other options for treatment, the patient has consented to  Procedure(s): BREAST BIOPSY (Left) as a surgical intervention .  The patient's history has been reviewed, patient examined, no change in status, stable for surgery.  I have reviewed the patient's chart and labs.  Questions were answered to the patient's satisfaction.     Aviva Signs

## 2017-10-05 NOTE — Anesthesia Postprocedure Evaluation (Signed)
Anesthesia Post Note  Patient: Victoria Vasquez  Procedure(s) Performed: BREAST BIOPSY (Left Breast)  Patient location during evaluation: PACU Anesthesia Type: MAC Level of consciousness: awake and alert and oriented Pain management: pain level controlled Vital Signs Assessment: post-procedure vital signs reviewed and stable Respiratory status: spontaneous breathing Cardiovascular status: blood pressure returned to baseline and stable Postop Assessment: no apparent nausea or vomiting Anesthetic complications: no     Last Vitals:  Vitals:   10/05/17 0658 10/05/17 0709  BP: (!) 214/96 (!) 216/83  Pulse: 65 69  Resp: 18 18  Temp:    SpO2: 99% 100%    Last Pain:  Vitals:   10/05/17 0645  PainSc: 0-No pain                 [place]

## 2017-10-05 NOTE — Discharge Instructions (Signed)
Breast Biopsy, Care After These instructions give you information about caring for yourself after your procedure. Your doctor may also give you more specific instructions. Call your doctor if you have any problems or questions after your procedure. Follow these instructions at home: Medicines  Take over-the-counter and prescription medicines only as told by your doctor.  Do not drive for 24 hours if you received a sedative.  Do not drink alcohol while taking pain medicine.  Do not drive or use heavy machinery while taking prescription pain medicine. Biopsy Site Care   Follow instructions from your doctor about how to take care of your cut from surgery (incision) or puncture area. Make sure you: ? Wash your hands with soap and water before you change your bandage. If you cannot use soap and water, use hand sanitizer. ? Change any bandages (dressings) as told by your doctor. ? Leave any stitches (sutures), skin glue, or skin tape (adhesive) strips in place. They may need to stay in place for 2 weeks or longer. If tape strips get loose and curl up, you may trim the loose edges. Do not remove tape strips completely unless your doctor says it is okay.  If you have stitches, keep them dry when you take a bath or a shower.  Check your cut or puncture area every day for signs of infection. Check for: ? More redness, swelling, or pain. ? More fluid or blood. ? Warmth. ? Pus or a bad smell.  Protect the biopsy area. Do not let the area get bumped. Activity  Avoid activities that could pull the biopsy site open. ? Avoid stretching. ? Avoid reaching. ? Avoid exercise. ? Avoid sports. ? Avoid lifting anything that is heavier than 3 pounds (1.4 kg).  Return to your normal activities as told by your doctor. Ask your doctor what activities are safe for you. General instructions  Continue your normal diet.  Wear a good support bra for as long as told by your doctor.  Get checked for extra  fluid in your body (lymphedema) as often as told by your doctor.  Keep all follow-up visits as told by your doctor. This is important. Contact a health care provider if:  You have more redness, swelling, or pain at the biopsy site.  You have more fluid or blood coming from your biopsy site.  Your biopsy site feels warm to the touch.  You have pus or a bad smell coming from the biopsy site.  Your biopsy site breaks open after the stitches, staples, or skin tape strips have been removed.  You have a rash.  You have a fever. Get help right away if:  You have more bleeding (more than a small spot) from the biopsy site.  You have trouble breathing.  You have red streaks around the biopsy site. This information is not intended to replace advice given to you by your health care provider. Make sure you discuss any questions you have with your health care provider. Document Released: 03/01/2009 Document Revised: 01/10/2016 Document Reviewed: 02/06/2015 Elsevier Interactive Patient Education  2018 [place].  Moderate Conscious Sedation, Adult, Care After These instructions provide you with information about caring for yourself after your procedure. Your health care provider may also give you more specific instructions. Your treatment has been planned according to current medical practices, but problems sometimes occur. Call your health care provider if you have any problems or questions after your procedure. What can I expect after the procedure? After your procedure,  it is common:  To feel sleepy for several hours.  To feel clumsy and have poor balance for several hours.  To have poor judgment for several hours.  To vomit if you eat too soon.  Follow these instructions at home: For at least 24 hours after the procedure:   Do not: ? Participate in activities where you could fall or become injured. ? Drive. ? Use heavy machinery. ? Drink alcohol. ? Take sleeping pills or  medicines that cause drowsiness. ? Make important decisions or sign legal documents. ? Take care of children on your own.  Rest. Eating and drinking  Follow the diet recommended by your health care provider.  If you vomit: ? Drink water, juice, or soup when you can drink without vomiting. ? Make sure you have little or no nausea before eating solid foods. General instructions  Have a responsible adult stay with you until you are awake and alert.  Take over-the-counter and prescription medicines only as told by your health care provider.  If you smoke, do not smoke without supervision.  Keep all follow-up visits as told by your health care provider. This is important. Contact a health care provider if:  You keep feeling nauseous or you keep vomiting.  You feel light-headed.  You develop a rash.  You have a fever. Get help right away if:  You have trouble breathing. This information is not intended to replace advice given to you by your health care provider. Make sure you discuss any questions you have with your health care provider. Document Released: 02/23/2013 Document Revised: 10/08/2015 Document Reviewed: 08/25/2015 Elsevier Interactive Patient Education  [employer].

## 2017-10-05 NOTE — Transfer of Care (Signed)
Immediate Anesthesia Transfer of Care Note  Patient: Victoria Vasquez  Procedure(s) Performed: BREAST BIOPSY (Left Breast)  Patient Location: PACU  Anesthesia Type:MAC  Level of Consciousness: awake  Airway & Oxygen Therapy: Patient Spontanous Breathing  Post-op Assessment: Report given to RN  Post vital signs: Reviewed and stable  Last Vitals:  Vitals Value Taken Time  BP    Temp    Pulse 56 10/05/2017  8:15 AM  Resp 13 10/05/2017  8:15 AM  SpO2 94 % 10/05/2017  8:15 AM  Vitals shown include unvalidated device data.  Last Pain:  Vitals:   10/05/17 0645  PainSc: 0-No pain         Complications: No apparent anesthesia complications

## 2017-10-05 NOTE — Anesthesia Preprocedure Evaluation (Signed)
Anesthesia Evaluation  Patient identified by MRN, date of birth, ID band Patient awake    Reviewed: Allergy & Precautions, H&P , NPO status , Patient's Chart, lab work & pertinent test results  Airway Mallampati: II  TM Distance: >3 FB Neck ROM: full    Dental no notable dental hx.    Pulmonary neg pulmonary ROS, shortness of breath, asthma , COPD,    Pulmonary exam normal breath sounds clear to auscultation       Cardiovascular Exercise Tolerance: Good hypertension, negative cardio ROS   Rhythm:regular Rate:Normal     Neuro/Psych negative neurological ROS  negative psych ROS   GI/Hepatic negative GI ROS, Neg liver ROS, GERD  ,  Endo/Other  negative endocrine ROSdiabetes  Renal/GU Renal diseasenegative Renal ROS  negative genitourinary   Musculoskeletal   Abdominal   Peds  Hematology negative hematology ROS (+)   Anesthesia Other Findings   Reproductive/Obstetrics negative OB ROS                             Anesthesia Physical Anesthesia Plan  ASA: III  Anesthesia Plan: MAC   Post-op Pain Management:    Induction:   PONV Risk Score and Plan:   Airway Management Planned:   Additional Equipment:   Intra-op Plan:   Post-operative Plan:   Informed Consent: I have reviewed the patients History and Physical, chart, labs and discussed the procedure including the risks, benefits and alternatives for the proposed anesthesia with the patient or authorized representative who has indicated his/her understanding and acceptance.   Dental Advisory Given  Plan Discussed with: CRNA  Anesthesia Plan Comments:         Anesthesia Quick Evaluation

## 2017-10-05 NOTE — Op Note (Signed)
Patient:  Victoria Vasquez  DOB:  [birth year]  MRN:  [id number]   Preop Diagnosis: Paget's disease of left nipple  Postop Diagnosis: Same  Procedure: Left breast biopsy  Surgeon: Aviva Signs, MD  Anes: MAC  Indications: Patient is an 84 year old white female who was found on breast examination to have possible left Paget's disease of the left nipple.  The patient now presents for a biopsy.  The risks and benefits of the procedure including bleeding and infection were fully explained to the patient, who gave informed consent.  Procedure note: The patient was placed in supine position.  After monitored anesthesia care was given, the left breast was prepped and draped using the usual sterile technique with ChloraPrep.  Surgical site confirmation was performed.  1% Xylocaine was used for local anesthesia.  An elliptical skin incision was made along the areolar border superiorly.  Some underlying breast tissue was also excised.  The specimen was sent to pathology for further examination.  A bleeding was controlled using Bovie electrocautery.  The subcutaneous layer was reapproximated using a 4-0 Vicryl suture.  The skin was closed using a 4-0 Monocryl subcuticular suture.  Dermabond was applied.  All tape and needle counts were correct at the end of the procedure.  The patient was transferred to PACU in stable condition.  Complications: None  EBL: Minimal  Specimen: Left nipple and breast tissue

## 2017-10-06 ENCOUNTER — Encounter (HOSPITAL_COMMUNITY): Payer: Self-pay | Admitting: General Surgery

## 2017-10-08 ENCOUNTER — Other Ambulatory Visit: Payer: Self-pay | Admitting: "Endocrinology

## 2017-10-13 ENCOUNTER — Encounter: Payer: Self-pay | Admitting: General Surgery

## 2017-10-13 ENCOUNTER — Ambulatory Visit (INDEPENDENT_AMBULATORY_CARE_PROVIDER_SITE_OTHER): Payer: Self-pay | Admitting: General Surgery

## 2017-10-13 VITALS — BP 188/88 | HR 73 | Temp 97.1°F | Resp 20 | Wt 181.0 lb

## 2017-10-13 DIAGNOSIS — Z09 Encounter for follow-up examination after completed treatment for conditions other than malignant neoplasm: Secondary | ICD-10-CM

## 2017-10-13 NOTE — Progress Notes (Signed)
Subjective:     Victoria Vasquez  Status post left breast and nipple biopsy.  Patient healing well.  Has no complaints. Objective:    BP (!) 188/88 (BP Location: Left Arm, Patient Position: Sitting, Cuff Size: Normal)   Pulse 73   Temp (!) 97.1 F (36.2 C) (Oral)   Resp 20   Wt 181 lb (82.1 kg)   BMI 32.06 kg/m   General:  alert, cooperative and no distress  Left breast incision healing well. Final pathology reveals Paget's disease of left breast    Assessment:    Doing well postoperatively.    Plan:   Will refer to [facility] oncology for further evaluation and treatment.

## 2017-10-14 ENCOUNTER — Telehealth: Payer: Self-pay | Admitting: Hematology and Oncology

## 2017-10-14 NOTE — Telephone Encounter (Signed)
Referral received from Dr. Arnoldo Morale at [facility] for dx of padget's disease of the breast. Received a call from the pt's granddaughter to schedule an appt. Appt has been scheduled to see Dr. Lindi Adie on 6/3 at 345pm. Aware to arrive 30 minutes early.

## 2017-10-19 ENCOUNTER — Inpatient Hospital Stay: Payer: PPO | Attending: Hematology and Oncology | Admitting: Hematology and Oncology

## 2017-10-19 DIAGNOSIS — I129 Hypertensive chronic kidney disease with stage 1 through stage 4 chronic kidney disease, or unspecified chronic kidney disease: Secondary | ICD-10-CM | POA: Diagnosis not present

## 2017-10-19 DIAGNOSIS — C50012 Malignant neoplasm of nipple and areola, left female breast: Secondary | ICD-10-CM | POA: Diagnosis not present

## 2017-10-19 DIAGNOSIS — E1122 Type 2 diabetes mellitus with diabetic chronic kidney disease: Secondary | ICD-10-CM | POA: Insufficient documentation

## 2017-10-19 DIAGNOSIS — Z79899 Other long term (current) drug therapy: Secondary | ICD-10-CM | POA: Diagnosis not present

## 2017-10-19 DIAGNOSIS — N189 Chronic kidney disease, unspecified: Secondary | ICD-10-CM | POA: Insufficient documentation

## 2017-10-19 DIAGNOSIS — J449 Chronic obstructive pulmonary disease, unspecified: Secondary | ICD-10-CM | POA: Insufficient documentation

## 2017-10-19 NOTE — Assessment & Plan Note (Signed)
Pathology review: I discussed the pathology in great detail with the patient and provided her with a copy of the report. Patient understands that Paget's disease is the description to the eczema-like eruption on the nipple and areola with a copious clear yellowish exudate. Quite commonly Paget's disease is associated with invasive or in situ carcinoma in nearly 85-88% of the time without any palpable mass or mammographic abnormality. Paget's disease tends to be ER/PR negative most commonly as it was in the patient's situation. There is no role of tamoxifen or antiestrogen therapy for Paget's disease without underlying DCIS or invasive cancer. Recommendation: 1.  Breast conserving surgery 2. Followed by observation  given her advanced age  I will call and speak to Dr. Arnoldo Morale at [facility] and make a decision regarding surgical options. I discussed with her that if she is not a candidate for surgery then she would be treated with observation.  There is no role of antiestrogen therapy is because she is ER PR negative.   Return to clinic after surgery.

## 2017-10-19 NOTE — Progress Notes (Signed)
[facility] CONSULT NOTE  Patient Care Team: Kathyrn Drown, MD as PCP - General (Family Medicine) Danie Binder, MD (Gastroenterology) Lattie Haw, Cristopher Estimable, MD as Attending Physician (Cardiology)  CHIEF COMPLAINTS/PURPOSE OF CONSULTATION:  Paget's disease of the breast  HISTORY OF PRESENTING ILLNESS:  Victoria Vasquez 84 y.o. female is here because of recent diagnosis of Paget's disease of the left breast.  Patient always had a mild discomfort in the left breast.  For the past several months she has noted significant changes around the nipple areolar complex as well as itching and scaling along with the bloody nipple discharge.  She was then referred to mammogram and ultrasound in May.  They commented on the skin changes but did not have any additional lumps or nodules in the breast.  She was sent to see Dr. Arnoldo Morale with surgery who then referred the patient for as to discuss treatment options. Patient is in a wheelchair and brought in by her daughter.  She informed me that she cannot see or hear because of her impairments.  She also has long-standing hypertension diabetes.  I reviewed her records extensively and collaborated the history with the patient.  MEDICAL HISTORY:  Past Medical History:  Diagnosis Date  . Arthritis   . Asthma   . Chest pain    Evaluation prior to 2003 reportedly including cath-records never located; Echo in 2009: Mild LVH, LAE2, nl EF, moderate LAE, MR1-2  . Chronic kidney disease   . COPD (chronic obstructive pulmonary disease) (HCC)    Exertional dyspnea; PFTs in 2002: Moderate small airway obstruction, mildly reduced lung volumes and DLCO, nl ABG  . Diabetes mellitus, type II ([place])   . Dyspnea   . Fatigue   . GERD (gastroesophageal reflux disease)   . Hyperlipidemia   . Hypertension   . Legally blind 1 1 2015  . Macular degeneration of both eyes 1 1 2015  . Parotid mass 2003   Right-2003  . Scoliosis   . Shingles     SURGICAL  HISTORY: Past Surgical History:  Procedure Laterality Date  . APPENDECTOMY  1951  . BREAST BIOPSY Left 10/05/2017   Procedure: BREAST BIOPSY;  Surgeon: Aviva Signs, MD;  Location: [facility];  Service: General;  Laterality: Left;  . CHOLECYSTECTOMY  2003  . COLONOSCOPY  04/16/2006   [place]-Normal colon.  Exam limited due to retained particulate matter in the lumen of the colon, polyps less than 1 cm would have been easily missed.  Otherwise, no polyps, masses, inflammatory changes or vascular ectasia seen/ Normal retroflexed view of the rectum..  recommend repeat exam at 03/2011  . ORIF  1996  . ORIF ANKLE FRACTURE  1992    SOCIAL HISTORY: Social History   Socioeconomic History  . Marital status: Widowed    Spouse name: Not on file  . Number of children: 3  . Years of education: Not on file  . Highest education level: Not on file  Occupational History  . Occupation: Retired  Scientific laboratory technician  . Financial resource strain: Not on file  . Food insecurity:    Worry: Not on file    Inability: Not on file  . Transportation needs:    Medical: Not on file    Non-medical: Not on file  Tobacco Use  . Smoking status: Passive Smoke Exposure - Never Smoker  . Smokeless tobacco: Never Used  Substance and Sexual Activity  . Alcohol use: No  . Drug use: No  .  Sexual activity: Not on file  Lifestyle  . Physical activity:    Days per week: Not on file    Minutes per session: Not on file  . Stress: Not on file  Relationships  . Social connections:    Talks on phone: Not on file    Gets together: Not on file    Attends religious service: Not on file    Active member of club or organization: Not on file    Attends meetings of clubs or organizations: Not on file    Relationship status: Not on file  . Intimate partner violence:    Fear of current or ex partner: Not on file    Emotionally abused: Not on file    Physically abused: Not on file    Forced sexual activity: Not on file  Other  Topics Concern  . Not on file  Social History Narrative   Widowed   3 children   No regular exercise    FAMILY HISTORY: Family History  Problem Relation Age of Onset  . Stomach cancer Mother   . Alzheimer's disease Sister   . Colon cancer Neg Hx     ALLERGIES:  is allergic to statins and lisinopril.  MEDICATIONS:  Current Outpatient Medications  Medication Sig Dispense Refill  . acetaminophen (TYLENOL) 650 MG CR tablet Take 650 mg by mouth every 8 (eight) hours as needed for pain.    Marland Kitchen aspirin 81 MG tablet Take 81 mg by mouth daily.    . budesonide-formoterol (SYMBICORT) 160-4.5 MCG/ACT inhaler USE 2 PUFFS TWICE DAILY FOR ASTHMA. RINSE MOUTH AFTER USE. 10.2 g 5  . glipiZIDE (GLUCOTROL) 5 MG tablet TAKE 1 TABLET BY MOUTH EACH MORNING AND 1/2 TABLET AT SUPPER. 45 tablet 5  . latanoprost (XALATAN) 0.005 % ophthalmic solution Place 1 drop into the right eye at bedtime.    Marland Kitchen losartan (COZAAR) 25 MG tablet TAKE ONE TABLET BY MOUTH ONCE DAILY. 90 tablet 0  . Multiple Vitamins-Minerals (PRESERVISION AREDS 2+MULTI VIT PO) Take 1 tablet by mouth daily.    . mupirocin ointment (BACTROBAN) 2 % Apply thin amount on affected area daily 22 g 1  . pantoprazole (PROTONIX) 40 MG tablet Take 40 mg by mouth daily.    . traMADol (ULTRAM) 50 MG tablet Take 1 tablet (50 mg total) by mouth every 6 (six) hours as needed. 20 tablet 0   No current facility-administered medications for this visit.     REVIEW OF SYSTEMS:   Constitutional: Denies fevers, chills or abnormal night sweats Eyes: Denies blurriness of vision, double vision or watery eyes Ears, nose, mouth, throat, and face: Denies mucositis or sore throat Respiratory: Denies cough, dyspnea or wheezes Cardiovascular: Denies palpitation, chest discomfort or lower extremity swelling Gastrointestinal:  Denies nausea, heartburn or change in bowel habits Skin: Denies abnormal skin rashes Lymphatics: Denies new lymphadenopathy or easy  bruising Neurological: Peripheral neuropathy Behavioral/Psych: Mild underlying depression Breast: Skin changes related to Paget's disease All other systems were reviewed with the patient and are negative.  PHYSICAL EXAMINATION: ECOG PERFORMANCE STATUS: 3 - Symptomatic, >50% confined to bed  Vitals:   10/19/17 1550  BP: (!) 193/81  Pulse: 64  Resp: 16  Temp: 98.3 F (36.8 C)  SpO2: 100%   Filed Weights   10/19/17 1550  Weight: 180 lb 9.6 oz (81.9 kg)    GENERAL:alert, no distress and comfortable SKIN: skin color, texture, turgor are normal, no rashes or significant lesions EYES: normal, conjunctiva are pink and  non-injected, sclera clear OROPHARYNX:no exudate, no erythema and lips, buccal mucosa, and tongue normal  NECK: supple, thyroid normal size, non-tender, without nodularity LYMPH:  no palpable lymphadenopathy in the cervical, axillary or inguinal LUNGS: clear to auscultation and percussion with normal breathing effort HEART: regular rate & rhythm and no murmurs and no lower extremity edema ABDOMEN:abdomen soft, non-tender and normal bowel sounds Musculoskeletal:no cyanosis of digits and no clubbing  PSYCH: alert & oriented x 3 with fluent speech NEURO: no focal motor/sensory deficits BREAST: Marked erythema around the left nipple and area with skin changes. No palpable axillary or supraclavicular lymphadenopathy (exam performed in the presence of a chaperone)   LABORATORY DATA:  I have reviewed the data as listed Lab Results  Component Value Date   WBC 6.0 09/25/2017   HGB 12.8 09/25/2017   HCT 40.1 09/25/2017   MCV 94.6 09/25/2017   PLT 181 09/25/2017   Lab Results  Component Value Date   NA 142 09/25/2017   K 4.1 09/25/2017   CL 105 09/25/2017   CO2 27 09/25/2017    RADIOGRAPHIC STUDIES: I have personally reviewed the radiological reports and agreed with the findings in the report.  ASSESSMENT AND PLAN:  Paget's carcinoma of the nipple, left  [facility]) Pathology review: I discussed the pathology in great detail with the patient and provided her with a copy of the report. Patient understands that Paget's disease is the description to the eczema-like eruption on the nipple and areola with a copious clear yellowish exudate. Quite commonly Paget's disease is associated with invasive or in situ carcinoma in nearly 85-88% of the time without any palpable mass or mammographic abnormality. Paget's disease tends to be ER/PR negative most commonly as it was in the patient's situation. There is no role of tamoxifen or antiestrogen therapy for Paget's disease without underlying DCIS or invasive cancer.  Recommendation: 1.  Breast conserving surgery we can forego radiation because of her elderly age 89. Followed by observation   I will call and speak to Dr. Arnoldo Morale at [facility] and make a decision regarding surgical options. I discussed with her that if she is not a candidate for surgery then she would be treated with observation.  There is no role of antiestrogen therapy is because she is ER PR negative.   Return to clinic after surgery.    All questions were answered. The patient knows to call the clinic with any problems, questions or concerns.    Victoria Ohara, MD 10/19/17

## 2017-10-20 ENCOUNTER — Telehealth: Payer: Self-pay | Admitting: Hematology and Oncology

## 2017-10-20 NOTE — Telephone Encounter (Signed)
I spoke to Dr. Arnoldo Morale with general surgery Dr. Arnoldo Morale is of the opinion that she is very high risk for surgery.   I informed Dr. Arnoldo Morale that tamoxifen has no role in Paget's disease. These tend to be not respond to antiestrogen therapies.  They are estrogen and progesterone receptor negative.  Therefore I called the patient and informed her of her discussion.  Patient has only 2 options one is surgery the other is observation. Because Dr. Arnoldo Morale thinks that she is too high risk for surgery, observation is the only approach. She will call Dr. Arnoldo Morale office and make an appointment for follow-up.

## 2017-10-23 DIAGNOSIS — H35033 Hypertensive retinopathy, bilateral: Secondary | ICD-10-CM | POA: Diagnosis not present

## 2017-10-23 DIAGNOSIS — H35372 Puckering of macula, left eye: Secondary | ICD-10-CM | POA: Diagnosis not present

## 2017-10-23 DIAGNOSIS — H353211 Exudative age-related macular degeneration, right eye, with active choroidal neovascularization: Secondary | ICD-10-CM | POA: Diagnosis not present

## 2017-10-23 DIAGNOSIS — H353124 Nonexudative age-related macular degeneration, left eye, advanced atrophic with subfoveal involvement: Secondary | ICD-10-CM | POA: Diagnosis not present

## 2017-10-26 ENCOUNTER — Other Ambulatory Visit: Payer: Self-pay | Admitting: "Endocrinology

## 2017-10-27 ENCOUNTER — Telehealth: Payer: Self-pay | Admitting: Family Medicine

## 2017-10-27 ENCOUNTER — Other Ambulatory Visit: Payer: Self-pay | Admitting: "Endocrinology

## 2017-10-27 NOTE — Telephone Encounter (Signed)
Fax from pharmacy for refill on One Touch Ultra Test Strip. This is not on her current med list but is on historical list. Pt states she does need refills. Last diabetic check was 07/20/2017.

## 2017-10-27 NOTE — Telephone Encounter (Signed)
Under Medicare guidelines she can do 1 tests per day-please go ahead with approval for this prescription may have 1 test per day, #30, 12 refills

## 2017-10-28 NOTE — Telephone Encounter (Signed)
Faxed to [facility]

## 2017-10-30 DIAGNOSIS — L851 Acquired keratosis [keratoderma] palmaris et plantaris: Secondary | ICD-10-CM | POA: Diagnosis not present

## 2017-10-30 DIAGNOSIS — B351 Tinea unguium: Secondary | ICD-10-CM | POA: Diagnosis not present

## 2017-10-30 DIAGNOSIS — E1142 Type 2 diabetes mellitus with diabetic polyneuropathy: Secondary | ICD-10-CM | POA: Diagnosis not present

## 2017-11-25 DIAGNOSIS — H401111 Primary open-angle glaucoma, right eye, mild stage: Secondary | ICD-10-CM | POA: Diagnosis not present

## 2017-12-29 ENCOUNTER — Other Ambulatory Visit: Payer: Self-pay | Admitting: Family Medicine

## 2018-01-08 DIAGNOSIS — E1142 Type 2 diabetes mellitus with diabetic polyneuropathy: Secondary | ICD-10-CM | POA: Diagnosis not present

## 2018-01-08 DIAGNOSIS — L97511 Non-pressure chronic ulcer of other part of right foot limited to breakdown of skin: Secondary | ICD-10-CM | POA: Diagnosis not present

## 2018-01-19 ENCOUNTER — Encounter: Payer: Self-pay | Admitting: Family Medicine

## 2018-01-19 ENCOUNTER — Ambulatory Visit (INDEPENDENT_AMBULATORY_CARE_PROVIDER_SITE_OTHER): Payer: PPO | Admitting: Family Medicine

## 2018-01-19 VITALS — BP 140/88 | HR 76 | Temp 97.9°F | Ht 63.0 in | Wt 180.0 lb

## 2018-01-19 DIAGNOSIS — J441 Chronic obstructive pulmonary disease with (acute) exacerbation: Secondary | ICD-10-CM | POA: Diagnosis not present

## 2018-01-19 DIAGNOSIS — C50012 Malignant neoplasm of nipple and areola, left female breast: Secondary | ICD-10-CM

## 2018-01-19 MED ORDER — AZITHROMYCIN 250 MG PO TABS: ORAL_TABLET | ORAL | 0 refills | Status: DC

## 2018-01-19 MED ORDER — PREDNISONE 20 MG PO TABS: ORAL_TABLET | ORAL | 0 refills | Status: DC

## 2018-01-19 NOTE — Progress Notes (Signed)
   Subjective:    Patient ID: Victoria Vasquez, female    DOB: 1927-09-17, 84 y.o.   MRN: [id number]  Cough  This is a recurrent problem. The current episode started more than 1 month ago. Associated symptoms include rhinorrhea, shortness of breath and wheezing. Pertinent negatives include no chest pain, ear pain or fever. Associated symptoms comments: Neck pain, seems like a big lump in throat. Treatments tried: OTC allergy med, Tylenol    She also has a cancer of the last left breast nipple she is somewhat confused about what should be done regarding this she was told by her oncologist initially to surgery and remove it was a impression she was told not to get surgery but she was never really told what to do according to the patient   Review of Systems  Constitutional: Negative for activity change and fever.  HENT: Positive for congestion and rhinorrhea. Negative for ear pain.   Eyes: Negative for discharge.  Respiratory: Positive for cough, shortness of breath and wheezing.   Cardiovascular: Negative for chest pain.       Objective:   Physical Exam  Constitutional: She appears well-developed.  HENT:  Head: Normocephalic.  Nose: Nose normal.  Mouth/Throat: Oropharynx is clear and moist. No oropharyngeal exudate.  Neck: Neck supple.  Cardiovascular: Normal rate and normal heart sounds.  No murmur heard. Pulmonary/Chest: Effort normal and breath sounds normal. She has no wheezes.  Lymphadenopathy:    She has no cervical adenopathy.  Skin: Skin is warm and dry.  Nursing note and vitals reviewed.         Assessment & Plan:  COPD exacerbation Continue Symbicort May use albuterol as needed Prednisone taper Antibiotic prescribed If progressive troubles or problems follow-up  Paget's disease- we will discuss this with the oncologist regarding what treatment they are recommending

## 2018-01-20 ENCOUNTER — Other Ambulatory Visit: Payer: Self-pay | Admitting: Family Medicine

## 2018-01-20 MED ORDER — ALBUTEROL SULFATE HFA 108 (90 BASE) MCG/ACT IN AERS: INHALATION_SPRAY | RESPIRATORY_TRACT | 0 refills | Status: DC

## 2018-01-20 NOTE — Progress Notes (Signed)
Prescription sent electronically to pharmacy.  Please advised  that she is to continue Symbicort but she can add this to the Symbicort every 3-4 hours as needed to help her with any shortness of breath and coughing also Dr Nicki Reaper  would like to have her follow-up within 3 to 4 weeks to recheck. Patient verbalized understanding.

## 2018-01-22 NOTE — Progress Notes (Signed)
I spoke with Magda Paganini at [place] office and she states there is no mention of removing the mass,so she will ask Dr. Arnoldo Morale if the patient is a candidate and will call us back with the answer. She states they are on Epic so we should have access to her records there.

## 2018-01-22 NOTE — Progress Notes (Signed)
Per Magda Paganini at [place] they referred the pt to [facility] and patient was not a candidate for surgery due to her age. She states there is a plan from onc dated 10/19/2017 in epic that states their plan of action.

## 2018-01-26 DIAGNOSIS — L97511 Non-pressure chronic ulcer of other part of right foot limited to breakdown of skin: Secondary | ICD-10-CM | POA: Diagnosis not present

## 2018-01-26 DIAGNOSIS — E119 Type 2 diabetes mellitus without complications: Secondary | ICD-10-CM | POA: Diagnosis not present

## 2018-01-26 DIAGNOSIS — M79674 Pain in right toe(s): Secondary | ICD-10-CM | POA: Diagnosis not present

## 2018-01-27 DIAGNOSIS — L97511 Non-pressure chronic ulcer of other part of right foot limited to breakdown of skin: Secondary | ICD-10-CM | POA: Diagnosis not present

## 2018-01-28 DIAGNOSIS — H353211 Exudative age-related macular degeneration, right eye, with active choroidal neovascularization: Secondary | ICD-10-CM | POA: Diagnosis not present

## 2018-01-28 DIAGNOSIS — H35033 Hypertensive retinopathy, bilateral: Secondary | ICD-10-CM | POA: Diagnosis not present

## 2018-01-28 DIAGNOSIS — H353124 Nonexudative age-related macular degeneration, left eye, advanced atrophic with subfoveal involvement: Secondary | ICD-10-CM | POA: Diagnosis not present

## 2018-01-28 DIAGNOSIS — H35372 Puckering of macula, left eye: Secondary | ICD-10-CM | POA: Diagnosis not present

## 2018-02-14 NOTE — Progress Notes (Signed)
Please talk with patient The oncologist felt that if she could go through surgery, breast conserving surgery would be the best Per their discussion with Dr. Jeanmarie Plant office stated that they did not feel she was a surgical candidate  At the same time I believe that she could potentially go through surgery but this would require a second opinion from a surgical group in [place]. If the patient is interested with getting a second opinion regarding surgery I can set her up in [place] If they did do surgery it would be localized surgery more than likely outpatient at a surgical center. Second opinion would be a office visit first with the surgeon If the patient is interested in doing so we can help set this up please discuss with patient  Obviously if the surgical group in [place] decides against surgery then conservative measures only.

## 2018-02-15 ENCOUNTER — Other Ambulatory Visit: Payer: Self-pay | Admitting: Family Medicine

## 2018-02-15 NOTE — Progress Notes (Signed)
Patient stated she is having issues with her breast bleeding and would like to come in and discuss the issue further with Dr Nicki Reaper. Office visit scheduled 02/16/18

## 2018-02-16 ENCOUNTER — Encounter: Payer: Self-pay | Admitting: Family Medicine

## 2018-02-16 ENCOUNTER — Ambulatory Visit (INDEPENDENT_AMBULATORY_CARE_PROVIDER_SITE_OTHER): Payer: PPO | Admitting: Family Medicine

## 2018-02-16 VITALS — BP 130/86 | Temp 98.0°F | Ht 63.0 in | Wt 181.2 lb

## 2018-02-16 DIAGNOSIS — E1122 Type 2 diabetes mellitus with diabetic chronic kidney disease: Secondary | ICD-10-CM | POA: Diagnosis not present

## 2018-02-16 DIAGNOSIS — C50012 Malignant neoplasm of nipple and areola, left female breast: Secondary | ICD-10-CM

## 2018-02-16 DIAGNOSIS — E1165 Type 2 diabetes mellitus with hyperglycemia: Secondary | ICD-10-CM

## 2018-02-16 DIAGNOSIS — I10 Essential (primary) hypertension: Secondary | ICD-10-CM | POA: Diagnosis not present

## 2018-02-16 DIAGNOSIS — Z23 Encounter for immunization: Secondary | ICD-10-CM | POA: Diagnosis not present

## 2018-02-16 DIAGNOSIS — L97511 Non-pressure chronic ulcer of other part of right foot limited to breakdown of skin: Secondary | ICD-10-CM | POA: Diagnosis not present

## 2018-02-16 DIAGNOSIS — IMO0002 Reserved for concepts with insufficient information to code with codable children: Secondary | ICD-10-CM

## 2018-02-16 DIAGNOSIS — N183 Chronic kidney disease, stage 3 (moderate): Secondary | ICD-10-CM

## 2018-02-16 DIAGNOSIS — E11621 Type 2 diabetes mellitus with foot ulcer: Secondary | ICD-10-CM | POA: Diagnosis not present

## 2018-02-16 DIAGNOSIS — M79674 Pain in right toe(s): Secondary | ICD-10-CM | POA: Diagnosis not present

## 2018-02-16 MED ORDER — BUDESONIDE-FORMOTEROL FUMARATE 160-4.5 MCG/ACT IN AERO: INHALATION_SPRAY | RESPIRATORY_TRACT | 5 refills | Status: DC

## 2018-02-16 MED ORDER — LOSARTAN POTASSIUM 25 MG PO TABS: 25.0000 mg | ORAL_TABLET | Freq: Every day | ORAL | 2 refills | Status: DC

## 2018-02-16 MED ORDER — GLIPIZIDE 5 MG PO TABS: ORAL_TABLET | ORAL | 5 refills | Status: DC

## 2018-02-16 MED ORDER — AZITHROMYCIN 250 MG PO TABS: ORAL_TABLET | ORAL | 0 refills | Status: DC

## 2018-02-16 NOTE — Progress Notes (Signed)
Subjective:    Patient ID: Victoria Vasquez, female    DOB: [birth year], 84 y.o.   MRN: [id number]  Cough  This is a recurrent problem. The current episode started 1 to 4 weeks ago. The cough is non-productive. Pertinent negatives include no chest pain, rhinorrhea or shortness of breath.   Pt also states her left nipple has been bleeding. Pt states the nipple bleeds a little bit in her bra. Pt had a biopsy done earlier this year and the bleeding stopped but it has came back.   The patient was seen today as part of a comprehensive diabetic check up.the patient does have diabetes.  The patient follows here on a regular basis.  The patient relates medication compliance. No significant side effects to the medications. Denies any low glucose spells. Relates compliance with diet to a reasonable level. Patient does do labwork intermittently and understands the dangers of diabetes.   Review of Systems  Constitutional: Negative for activity change, appetite change and fatigue.  HENT: Negative for congestion and rhinorrhea.   Respiratory: Positive for cough. Negative for shortness of breath.   Cardiovascular: Negative for chest pain and leg swelling.  Gastrointestinal: Negative for abdominal pain and diarrhea.  Endocrine: Negative for polydipsia and polyphagia.  Skin: Negative for color change.  Neurological: Negative for dizziness and weakness.  Psychiatric/Behavioral: Negative for behavioral problems and confusion.  Patient has bleeding from the nipple.  Seemingly getting larger Paget's disease Denies chest tightness pressure pain shortness of breath nausea vomiting diarrhea     Objective:   Physical Exam  Constitutional: She appears well-nourished. No distress.  HENT:  Head: Normocephalic and atraumatic.  Eyes: Right eye exhibits no discharge. Left eye exhibits no discharge.  Neck: No tracheal deviation present.  Cardiovascular: Normal rate, regular rhythm and normal heart sounds.  No  murmur heard. Pulmonary/Chest: Effort normal and breath sounds normal. No respiratory distress.  Musculoskeletal: She exhibits no edema.  Lymphadenopathy:    She has no cervical adenopathy.  Neurological: She is alert. Coordination normal.  Skin: Skin is warm and dry.  Psychiatric: She has a normal mood and affect. Her behavior is normal.  Vitals reviewed.   Patient is doing well today lungs are clear heart regular Paget's disease is noted in the left breast Patient for blood pressure check up.  The patient does have hypertension.  The patient is on medication.  Patient relates compliance with meds. Todays BP reviewed with the patient. Patient denies issues with medication. Patient relates reasonable diet. Patient tries to minimize salt. Patient aware of BP goals.      Assessment & Plan:  Diabetes-do lab work continue medication check sugars periodically if any low sugars to let us know  Diabetic foot ulcer being cared for by podiatry The patient was seen today as part of a comprehensive visit for diabetes. The importance of keeping her A1c at or below 7 was discussed.  Importance of regular physical activity was discussed.   The importance of adherence to medication as well as a controlled low starch/sugar diet was also discussed.  Standard follow-up visit recommended.  Also patient aware failure to keep diabetes under control increases the risk of complications.  HTN- Patient was seen today as part of a visit regarding hypertension. The importance of healthy diet and regular physical activity was discussed. The importance of compliance with medications discussed.  Ideal goal is to keep blood pressure low elevated levels certainly below 314/97 when possible.  The patient was counseled  that keeping blood pressure under control lessen his risk of complications.  The importance of regular follow-ups was discussed with the patient.  Low-salt diet such as DASH recommended.  Regular  physical activity was recommended as well.  Patient was advised to keep regular follow-ups.   Paget's disease- cardiology stated that she could do surgery Oncology recommended surgery Local surgeon felt it was too risky We will get second opinion from additional surgical input Cardiology stated that she would be fine to do biopsy surgery. More than likely would be able to do fine with a same-day surgery such as this

## 2018-02-17 ENCOUNTER — Telehealth: Payer: Self-pay | Admitting: *Deleted

## 2018-02-17 ENCOUNTER — Other Ambulatory Visit (HOSPITAL_COMMUNITY): Payer: Self-pay | Admitting: Podiatry

## 2018-02-17 DIAGNOSIS — L97519 Non-pressure chronic ulcer of other part of right foot with unspecified severity: Secondary | ICD-10-CM

## 2018-02-17 DIAGNOSIS — M79674 Pain in right toe(s): Secondary | ICD-10-CM

## 2018-02-17 DIAGNOSIS — E11621 Type 2 diabetes mellitus with foot ulcer: Secondary | ICD-10-CM

## 2018-02-17 DIAGNOSIS — L97511 Non-pressure chronic ulcer of other part of right foot limited to breakdown of skin: Secondary | ICD-10-CM

## 2018-02-17 LAB — BASIC METABOLIC PANEL
BUN/Creatinine Ratio: 22 (ref 12–28)
BUN: 26 mg/dL (ref 8–27)
CO2: 23 mmol/L (ref 20–29)
CREATININE: 1.18 mg/dL — AB (ref 0.57–1.00)
Calcium: 9 mg/dL (ref 8.7–10.3)
Chloride: 105 mmol/L (ref 96–106)
GFR calc non Af Amer: 41 mL/min/{1.73_m2} — ABNORMAL LOW (ref >59)
GFR, EST AFRICAN AMERICAN: 48 mL/min/{1.73_m2} — AB (ref >59)
GLUCOSE: 183 mg/dL — AB (ref 65–99)
Potassium: 5 mmol/L (ref 3.5–5.2)
Sodium: 144 mmol/L (ref 134–144)

## 2018-02-17 LAB — HEMOGLOBIN A1C
ESTIMATED AVERAGE GLUCOSE: 220 mg/dL
Hgb A1c MFr Bld: 9.3 % — ABNORMAL HIGH (ref 4.8–5.6)

## 2018-02-17 NOTE — Telephone Encounter (Signed)
Dr. Bary Leriche note below: Please call [facility].  I would like to speak with 1 of the surgeons that does breast surgery  Patient call me on my cell phone  Please make sure you write the patient's name date of birth and send it back to me as a result note so I can easily find this patient when the specialist calls  Please put notification of this on the door by my corner thank you   Called and left message for a breast surgeon at [facility]  to call back on dr scott's cell number.

## 2018-02-18 ENCOUNTER — Other Ambulatory Visit: Payer: Self-pay

## 2018-02-18 ENCOUNTER — Telehealth: Payer: Self-pay | Admitting: Family Medicine

## 2018-02-18 DIAGNOSIS — C50019 Malignant neoplasm of nipple and areola, unspecified female breast: Secondary | ICD-10-CM

## 2018-02-18 NOTE — Telephone Encounter (Signed)
Please notify the patient I did speak with Dr. Donne Hazel surgeon at [facility] They stated that they feel they can help her Let the patient know that we are referring her to them Their office will be connecting with her to let her know regarding appointment- more than likely they will get her in within the next 2 weeks  Brendale to send referral to [facility] and please reference on there that I spoke with Dr. Donne Hazel who stated that they would most likely be able to get her in within the next 2 weeks

## 2018-02-18 NOTE — Telephone Encounter (Signed)
Dtr is aware. Brendale please see note.

## 2018-02-18 NOTE — Telephone Encounter (Signed)
See other telephone message 

## 2018-02-19 ENCOUNTER — Ambulatory Visit (HOSPITAL_COMMUNITY)
Admission: RE | Admit: 2018-02-19 | Discharge: 2018-02-19 | Disposition: A | Discharge status: Home / Self Care | Payer: PPO | Source: Ambulatory Visit | Attending: Podiatry | Admitting: Podiatry

## 2018-02-19 DIAGNOSIS — E11621 Type 2 diabetes mellitus with foot ulcer: Secondary | ICD-10-CM | POA: Insufficient documentation

## 2018-02-19 DIAGNOSIS — L97519 Non-pressure chronic ulcer of other part of right foot with unspecified severity: Secondary | ICD-10-CM

## 2018-02-19 DIAGNOSIS — M79674 Pain in right toe(s): Secondary | ICD-10-CM | POA: Insufficient documentation

## 2018-02-19 DIAGNOSIS — L97511 Non-pressure chronic ulcer of other part of right foot limited to breakdown of skin: Secondary | ICD-10-CM | POA: Insufficient documentation

## 2018-02-19 DIAGNOSIS — I7789 Other specified disorders of arteries and arterioles: Secondary | ICD-10-CM | POA: Insufficient documentation

## 2018-02-19 DIAGNOSIS — Z8679 Personal history of other diseases of the circulatory system: Secondary | ICD-10-CM | POA: Diagnosis not present

## 2018-02-23 DIAGNOSIS — L97511 Non-pressure chronic ulcer of other part of right foot limited to breakdown of skin: Secondary | ICD-10-CM | POA: Diagnosis not present

## 2018-02-23 DIAGNOSIS — E11621 Type 2 diabetes mellitus with foot ulcer: Secondary | ICD-10-CM | POA: Diagnosis not present

## 2018-02-23 DIAGNOSIS — M79674 Pain in right toe(s): Secondary | ICD-10-CM | POA: Diagnosis not present

## 2018-03-01 ENCOUNTER — Telehealth: Payer: Self-pay | Admitting: Family Medicine

## 2018-03-01 NOTE — Telephone Encounter (Signed)
GLUCOSE READINGS 02/18/18: 234 (PM) 02/19/18: 176 (AM)                 246 (PM) 02/20/18: 171 (AM)                 193 (PM) 02/21/18: 142 (AM) 02/22/18: 155 (AM)                 257(PM) 02/23/18: 162 (AM)                  261 (PM) 02/24/18: 160 (AM)                  278 (PM)  PCP: Dr.Scott

## 2018-03-04 ENCOUNTER — Other Ambulatory Visit: Payer: Self-pay | Admitting: *Deleted

## 2018-03-04 MED ORDER — GLIPIZIDE 5 MG PO TABS: ORAL_TABLET | ORAL | 5 refills | Status: DC

## 2018-03-04 NOTE — Telephone Encounter (Signed)
Discussed with pt pt verbalized understanding. New directions sent to pharm.

## 2018-03-04 NOTE — Telephone Encounter (Signed)
May adjust her glipizide I would recommend 1-1/2 tablets in the morning 1/2 tablet in the evening Patient should periodically check her sugars send [place] more readings in a few weeks Patient cannot skip meals while on this medication If any low sugar spells she needs to let us know right away

## 2018-03-09 ENCOUNTER — Other Ambulatory Visit: Payer: Self-pay | Admitting: General Surgery

## 2018-03-09 DIAGNOSIS — C50012 Malignant neoplasm of nipple and areola, left female breast: Secondary | ICD-10-CM | POA: Diagnosis not present

## 2018-03-12 NOTE — Pre-Procedure Instructions (Signed)
Victoria Vasquez  03/12/2018      Your procedure is scheduled on 03/18/2018.  Report to [facility] Admitting at 0900 A.M.  Call this number if you have problems the morning of surgery:  [phone]   Remember:  Nothing to eat after midnight.     Please complete your PRE-SURGERY ENSURE that was given to before 0800 the morning of surgery.  Please, if able, drink it in one sitting. DO NOT SIP.     You may drink clear liquids until 0800am .  Clear liquids allowed are:                    Water, Juice (non-citric and without pulp), Carbonated beverages, Clear Tea, Black Coffee only and Gatorade    Take these medicines the morning of surgery with A SIP OF WATER:  acetaminophen (TYLENOL) albuterol (PROVENTIL HFA;VENTOLIN HFA) inhaler (Bring with you to the hospital) budesonide-formoterol (SYMBICORT)  pantoprazole (PROTONIX) traMADol (ULTRAM)  7 days prior to surgery STOP taking any Aspirin(unless otherwise instructed by your surgeon), Aleve, Naproxen, Ibuprofen, Motrin, Advil, Goody's, BC's, all herbal medications, fish oil, and all vitamins  WHAT DO I DO ABOUT MY DIABETES MEDICATION?  Marland Kitchen Do not take oral diabetes medicines (Glipizide (Glucotrol)) the morning of surgery.   How to Manage Your Diabetes Before and After Surgery  Why is it important to control my blood sugar before and after surgery? . Improving blood sugar levels before and after surgery helps healing and can limit problems. . A way of improving blood sugar control is eating a healthy diet by: o  Eating less sugar and carbohydrates o  Increasing activity/exercise o  Talking with your doctor about reaching your blood sugar goals . High blood sugars (greater than 180 mg/dL) can raise your risk of infections and slow your recovery, so you will need to focus on controlling your diabetes during the weeks before surgery. . Make sure that the doctor who takes care of your diabetes knows about your planned  surgery including the date and location.  How do I manage my blood sugar before surgery? . Check your blood sugar at least 4 times a day, starting 2 days before surgery, to make sure that the level is not too high or low. o Check your blood sugar the morning of your surgery when you wake up and every 2 hours until you get to the Short Stay unit. . If your blood sugar is less than 70 mg/dL, you will need to treat for low blood sugar: o Do not take insulin. o Treat a low blood sugar (less than 70 mg/dL) with  cup of clear juice (cranberry or apple), 4 glucose tablets, OR glucose gel. o Recheck blood sugar in 15 minutes after treatment (to make sure it is greater than 70 mg/dL). If your blood sugar is not greater than 70 mg/dL on recheck, call [phone] for further instructions. . Report your blood sugar to the short stay nurse when you get to Short Stay.  . If you are admitted to the hospital after surgery: o Your blood sugar will be checked by the staff and you will probably be given insulin after surgery (instead of oral diabetes medicines) to make sure you have good blood sugar levels. o The goal for blood sugar control after surgery is 80-180 mg/dL.      Do not wear jewelry, make-up or nail polish.  Do not wear lotions, powders, or perfumes, or deodorant.  Do not shave 48 hours prior to surgery.    Do not bring valuables to the hospital.  [facility] is not responsible for any belongings or valuables.  Contacts, eyeglasses, hearing aids, dentures or bridgework may not be worn into surgery.  Leave your suitcase in the car.  After surgery it may be brought to your room.  For patients admitted to the hospital, discharge time will be determined by your treatment team.  Patients discharged the day of surgery will not be allowed to drive home.   Name and phone number of your driver:    Special instructions:   [place]- Preparing For Surgery  Before surgery, you can play an  important role. Because skin is not sterile, your skin needs to be as free of germs as possible. You can reduce the number of germs on your skin by washing with CHG (chlorahexidine gluconate) Soap before surgery.  CHG is an antiseptic cleaner which kills germs and bonds with the skin to continue killing germs even after washing.    Oral Hygiene is also important to reduce your risk of infection.  Remember - BRUSH YOUR TEETH THE MORNING OF SURGERY WITH YOUR REGULAR TOOTHPASTE  Please do not use if you have an allergy to CHG or antibacterial soaps. If your skin becomes reddened/irritated stop using the CHG.  Do not shave (including legs and underarms) for at least 48 hours prior to first CHG shower. It is OK to shave your face.  Please follow these instructions carefully.   1. Shower the NIGHT BEFORE SURGERY and the MORNING OF SURGERY with CHG.   2. If you chose to wash your hair, wash your hair first as usual with your normal shampoo.  3. After you shampoo, rinse your hair and body thoroughly to remove the shampoo.  4. Use CHG as you would any other liquid soap. You can apply CHG directly to the skin and wash gently with a scrungie or a clean washcloth.   5. Apply the CHG Soap to your body ONLY FROM THE NECK DOWN.  Do not use on open wounds or open sores. Avoid contact with your eyes, ears, mouth and genitals (private parts). Wash Face and genitals (private parts)  with your normal soap.  6. Wash thoroughly, paying special attention to the area where your surgery will be performed.  7. Thoroughly rinse your body with warm water from the neck down.  8. DO NOT shower/wash with your normal soap after using and rinsing off the CHG Soap.  9. Pat yourself dry with a CLEAN TOWEL.  10. Wear CLEAN PAJAMAS to bed the night before surgery, wear comfortable clothes the morning of surgery  11. Place CLEAN SHEETS on your bed the night of your first shower and DO NOT SLEEP WITH PETS.    Day of  Surgery: Shower as stated above. Do not apply any deodorants/lotions.  Please wear clean clothes to the hospital/surgery center.   Remember to brush your teeth WITH YOUR REGULAR TOOTHPASTE.    Please read over the following fact sheets that you were given.

## 2018-03-15 ENCOUNTER — Encounter (HOSPITAL_COMMUNITY)
Admission: RE | Admit: 2018-03-15 | Discharge: 2018-03-15 | Disposition: A | Discharge status: Home / Self Care | Payer: PPO | Source: Ambulatory Visit | Attending: General Surgery | Admitting: General Surgery

## 2018-03-15 ENCOUNTER — Encounter (HOSPITAL_COMMUNITY): Payer: Self-pay

## 2018-03-15 ENCOUNTER — Other Ambulatory Visit: Payer: Self-pay

## 2018-03-15 DIAGNOSIS — C50012 Malignant neoplasm of nipple and areola, left female breast: Secondary | ICD-10-CM | POA: Diagnosis present

## 2018-03-15 DIAGNOSIS — Z01812 Encounter for preprocedural laboratory examination: Secondary | ICD-10-CM

## 2018-03-15 DIAGNOSIS — Z6832 Body mass index (BMI) 32.0-32.9, adult: Secondary | ICD-10-CM | POA: Diagnosis not present

## 2018-03-15 DIAGNOSIS — K219 Gastro-esophageal reflux disease without esophagitis: Secondary | ICD-10-CM | POA: Diagnosis not present

## 2018-03-15 DIAGNOSIS — Z7984 Long term (current) use of oral hypoglycemic drugs: Secondary | ICD-10-CM | POA: Diagnosis not present

## 2018-03-15 DIAGNOSIS — D0512 Intraductal carcinoma in situ of left breast: Secondary | ICD-10-CM | POA: Diagnosis not present

## 2018-03-15 DIAGNOSIS — Z86711 Personal history of pulmonary embolism: Secondary | ICD-10-CM | POA: Diagnosis not present

## 2018-03-15 DIAGNOSIS — I10 Essential (primary) hypertension: Secondary | ICD-10-CM | POA: Diagnosis not present

## 2018-03-15 DIAGNOSIS — J45909 Unspecified asthma, uncomplicated: Secondary | ICD-10-CM | POA: Diagnosis not present

## 2018-03-15 DIAGNOSIS — Z7982 Long term (current) use of aspirin: Secondary | ICD-10-CM | POA: Diagnosis not present

## 2018-03-15 DIAGNOSIS — J449 Chronic obstructive pulmonary disease, unspecified: Secondary | ICD-10-CM | POA: Diagnosis not present

## 2018-03-15 DIAGNOSIS — C44591 Other specified malignant neoplasm of skin of breast: Secondary | ICD-10-CM | POA: Diagnosis not present

## 2018-03-15 DIAGNOSIS — E119 Type 2 diabetes mellitus without complications: Secondary | ICD-10-CM | POA: Diagnosis not present

## 2018-03-15 DIAGNOSIS — Z79899 Other long term (current) drug therapy: Secondary | ICD-10-CM | POA: Diagnosis not present

## 2018-03-15 HISTORY — DX: Family history of other specified conditions: Z84.89

## 2018-03-15 LAB — CBC
HEMATOCRIT: 37.7 % (ref 36.0–46.0)
HEMOGLOBIN: 11.6 g/dL — AB (ref 12.0–15.0)
MCH: 29.8 pg (ref 26.0–34.0)
MCHC: 30.8 g/dL (ref 30.0–36.0)
MCV: 96.9 fL (ref 80.0–100.0)
Platelets: 176 10*3/uL (ref 150–400)
RBC: 3.89 MIL/uL (ref 3.87–5.11)
RDW: 13.7 % (ref 11.5–15.5)
WBC: 5.1 10*3/uL (ref 4.0–10.5)
nRBC: 0 % (ref 0.0–0.2)

## 2018-03-15 LAB — BASIC METABOLIC PANEL
Anion gap: 3 — ABNORMAL LOW (ref 5–15)
BUN: 22 mg/dL (ref 8–23)
CHLORIDE: 111 mmol/L (ref 98–111)
CO2: 26 mmol/L (ref 22–32)
Calcium: 8.7 mg/dL — ABNORMAL LOW (ref 8.9–10.3)
Creatinine, Ser: 1.24 mg/dL — ABNORMAL HIGH (ref 0.44–1.00)
GFR calc Af Amer: 44 mL/min — ABNORMAL LOW (ref >60)
GFR, EST NON AFRICAN AMERICAN: 38 mL/min — AB (ref >60)
GLUCOSE: 171 mg/dL — AB (ref 70–99)
POTASSIUM: 4.3 mmol/L (ref 3.5–5.1)
Sodium: 140 mmol/L (ref 135–145)

## 2018-03-15 LAB — GLUCOSE, CAPILLARY: GLUCOSE-CAPILLARY: 173 mg/dL — AB (ref 70–99)

## 2018-03-15 NOTE — Anesthesia Preprocedure Evaluation (Addendum)
Anesthesia Evaluation  Patient identified by MRN, date of birth, ID band Patient awake    Reviewed: Allergy & Precautions, NPO status , Patient's Chart, lab work & pertinent test results  History of Anesthesia Complications Negative for: history of anesthetic complications  Airway Mallampati: II  TM Distance: >3 FB Neck ROM: Full    Dental  (+) Partial Lower, Partial Upper   Pulmonary shortness of breath, COPD,  COPD inhaler,    breath sounds clear to auscultation       Cardiovascular hypertension, Pt. on medications (-) angina Rhythm:Regular     Neuro/Psych negative neurological ROS  negative psych ROS   GI/Hepatic Neg liver ROS, GERD  ,  Endo/Other  diabetes, Type 2Morbid obesity  Renal/GU CRFRenal disease     Musculoskeletal  (+) Arthritis ,   Abdominal   Peds  Hematology   Anesthesia Other Findings 2014 tte: - Left ventricle: The cavity size was normal. There was mild concentric hypertrophy. Systolic function was low normal. The estimated ejection fraction was in the range of 50% to 55%. Moderate hypokinesis of the inferolateral myocardium. - Left atrium: The atrium was mildly dilated. - Right ventricle: The cavity size was normal. Wall thickness was moderately increased. - Atrial septum: No defect or patent foramen ovale was identified.  Reproductive/Obstetrics                            Anesthesia Physical Anesthesia Plan  ASA: III  Anesthesia Plan: General   Post-op Pain Management:    Induction: Intravenous  PONV Risk Score and Plan: 3 and Ondansetron, Dexamethasone, TIVA and Propofol infusion  Airway Management Planned: LMA  Additional Equipment: None  Intra-op Plan:   Post-operative Plan: Extubation in OR  Informed Consent: I have reviewed the patients History and Physical, chart, labs and discussed the procedure including the risks, benefits and  alternatives for the proposed anesthesia with the patient or authorized representative who has indicated his/her understanding and acceptance.   Dental advisory given  Plan Discussed with: CRNA and Surgeon  Anesthesia Plan Comments: (Pt was cleared by Dr. Bronson Ing 09/28/2017 prior to biopsy, see his note from that date for complete cardiac history.)       Anesthesia Quick Evaluation

## 2018-03-15 NOTE — Progress Notes (Signed)
PCP - Dr. Sallee Lange Cardiologist - Dr. Bronson Ing  Chest x-ray - N/A EKG - 09/25/17 Stress Test - 2014 ECHO -  2014 Cardiac Cath - 1999  Sleep Study - 11/10/12 CPAP - patient denies need for CPAP  Fasting Blood Sugar - 160's Checks Blood Sugar 1 times a day  Aspirin Instructions: pt instructed to stop any ASA/NSAIDS 7 days prior to surgery  Anesthesia review: yes- heart history and A1c   Patient denies shortness of breath, fever, cough and chest pain at PAT appointment   Patient verbalized understanding of instructions that were given to them at the PAT appointment. Patient was also instructed that they will need to review over the PAT instructions again at home before surgery.

## 2018-03-16 DIAGNOSIS — M79674 Pain in right toe(s): Secondary | ICD-10-CM | POA: Diagnosis not present

## 2018-03-16 DIAGNOSIS — L97511 Non-pressure chronic ulcer of other part of right foot limited to breakdown of skin: Secondary | ICD-10-CM | POA: Diagnosis not present

## 2018-03-16 DIAGNOSIS — E11621 Type 2 diabetes mellitus with foot ulcer: Secondary | ICD-10-CM | POA: Diagnosis not present

## 2018-03-18 ENCOUNTER — Ambulatory Visit (HOSPITAL_COMMUNITY): Payer: PPO | Admitting: Anesthesiology

## 2018-03-18 ENCOUNTER — Encounter (HOSPITAL_COMMUNITY): Payer: Self-pay

## 2018-03-18 ENCOUNTER — Encounter (HOSPITAL_COMMUNITY)
Admission: RE | Disposition: A | Discharge status: Home / Self Care | Payer: Self-pay | Source: Ambulatory Visit | Attending: General Surgery

## 2018-03-18 ENCOUNTER — Observation Stay (HOSPITAL_COMMUNITY)
Admission: RE | Admit: 2018-03-18 | Discharge: 2018-03-19 | Disposition: A | Discharge status: Home / Self Care | Payer: PPO | Source: Ambulatory Visit | Attending: General Surgery | Admitting: General Surgery

## 2018-03-18 ENCOUNTER — Other Ambulatory Visit: Payer: Self-pay

## 2018-03-18 ENCOUNTER — Ambulatory Visit (HOSPITAL_COMMUNITY): Payer: PPO | Admitting: Physician Assistant

## 2018-03-18 DIAGNOSIS — Z86711 Personal history of pulmonary embolism: Secondary | ICD-10-CM | POA: Diagnosis not present

## 2018-03-18 DIAGNOSIS — Z79899 Other long term (current) drug therapy: Secondary | ICD-10-CM | POA: Diagnosis not present

## 2018-03-18 DIAGNOSIS — I10 Essential (primary) hypertension: Secondary | ICD-10-CM | POA: Diagnosis not present

## 2018-03-18 DIAGNOSIS — C50912 Malignant neoplasm of unspecified site of left female breast: Secondary | ICD-10-CM | POA: Diagnosis present

## 2018-03-18 DIAGNOSIS — Z7984 Long term (current) use of oral hypoglycemic drugs: Secondary | ICD-10-CM | POA: Diagnosis not present

## 2018-03-18 DIAGNOSIS — Z7982 Long term (current) use of aspirin: Secondary | ICD-10-CM | POA: Diagnosis not present

## 2018-03-18 DIAGNOSIS — J449 Chronic obstructive pulmonary disease, unspecified: Secondary | ICD-10-CM | POA: Diagnosis not present

## 2018-03-18 DIAGNOSIS — C44591 Other specified malignant neoplasm of skin of breast: Secondary | ICD-10-CM | POA: Diagnosis not present

## 2018-03-18 DIAGNOSIS — D0512 Intraductal carcinoma in situ of left breast: Principal | ICD-10-CM | POA: Insufficient documentation

## 2018-03-18 DIAGNOSIS — Z6832 Body mass index (BMI) 32.0-32.9, adult: Secondary | ICD-10-CM | POA: Insufficient documentation

## 2018-03-18 DIAGNOSIS — C50012 Malignant neoplasm of nipple and areola, left female breast: Secondary | ICD-10-CM | POA: Diagnosis not present

## 2018-03-18 DIAGNOSIS — E119 Type 2 diabetes mellitus without complications: Secondary | ICD-10-CM | POA: Insufficient documentation

## 2018-03-18 DIAGNOSIS — K219 Gastro-esophageal reflux disease without esophagitis: Secondary | ICD-10-CM | POA: Diagnosis not present

## 2018-03-18 DIAGNOSIS — J45909 Unspecified asthma, uncomplicated: Secondary | ICD-10-CM | POA: Insufficient documentation

## 2018-03-18 HISTORY — DX: Malignant (primary) neoplasm, unspecified: C80.1

## 2018-03-18 HISTORY — PX: BREAST LUMPECTOMY: SHX2

## 2018-03-18 LAB — GLUCOSE, CAPILLARY
GLUCOSE-CAPILLARY: 302 mg/dL — AB (ref 70–99)
GLUCOSE-CAPILLARY: 273 mg/dL — AB (ref 70–99)
GLUCOSE-CAPILLARY: 264 mg/dL — AB (ref 70–99)
Glucose-Capillary: 321 mg/dL — ABNORMAL HIGH (ref 70–99)

## 2018-03-18 SURGERY — BREAST LUMPECTOMY
Anesthesia: General | Site: Breast | Laterality: Left

## 2018-03-18 MED ORDER — BUPIVACAINE-EPINEPHRINE 0.25% -1:200000 IJ SOLN
INTRAMUSCULAR | Status: DC | PRN
  Administered 2018-03-18: 14 mL

## 2018-03-18 MED ORDER — MOMETASONE FURO-FORMOTEROL FUM 200-5 MCG/ACT IN AERO
2.0000 | INHALATION_SPRAY | Freq: Two times a day (BID) | RESPIRATORY_TRACT | Status: DC
  Administered 2018-03-19: 2 via RESPIRATORY_TRACT
  Filled 2018-03-18 (×2): qty 8.8

## 2018-03-18 MED ORDER — ASPIRIN 81 MG PO CHEW
81.0000 mg | CHEWABLE_TABLET | Freq: Every day | ORAL | Status: DC
  Administered 2018-03-19: 81 mg via ORAL
  Filled 2018-03-18: qty 1

## 2018-03-18 MED ORDER — ACETAMINOPHEN 160 MG/5ML PO SOLN: 1000.0000 mg | Freq: Once | ORAL | Status: DC | PRN

## 2018-03-18 MED ORDER — LATANOPROST 0.005 % OP SOLN
1.0000 [drp] | Freq: Every day | OPHTHALMIC | Status: DC
  Administered 2018-03-18: 1 [drp] via OPHTHALMIC
  Filled 2018-03-18: qty 2.5

## 2018-03-18 MED ORDER — ACETAMINOPHEN 500 MG PO TABS: 1000.0000 mg | ORAL_TABLET | Freq: Once | ORAL | Status: DC | PRN

## 2018-03-18 MED ORDER — ACETAMINOPHEN 500 MG PO TABS
1000.0000 mg | ORAL_TABLET | ORAL | Status: AC
  Administered 2018-03-18: 500 mg via ORAL

## 2018-03-18 MED ORDER — ONDANSETRON HCL 4 MG/2ML IJ SOLN
INTRAMUSCULAR | Status: DC | PRN
  Administered 2018-03-18: 4 mg via INTRAVENOUS

## 2018-03-18 MED ORDER — FENTANYL CITRATE (PF) 100 MCG/2ML IJ SOLN: 25.0000 ug | INTRAMUSCULAR | Status: DC | PRN

## 2018-03-18 MED ORDER — GABAPENTIN 100 MG PO CAPS: 100.0000 mg | ORAL_CAPSULE | ORAL | Status: DC

## 2018-03-18 MED ORDER — GLIPIZIDE 5 MG PO TABS
2.5000 mg | ORAL_TABLET | Freq: Every day | ORAL | Status: DC
  Administered 2018-03-18: 2.5 mg via ORAL
  Filled 2018-03-18: qty 1

## 2018-03-18 MED ORDER — ONDANSETRON HCL 4 MG/2ML IJ SOLN: 4.0000 mg | Freq: Four times a day (QID) | INTRAMUSCULAR | Status: DC | PRN

## 2018-03-18 MED ORDER — LIDOCAINE HCL (PF) 1 % IJ SOLN
INTRAMUSCULAR | Status: AC
  Filled 2018-03-18: qty 30

## 2018-03-18 MED ORDER — 0.9 % SODIUM CHLORIDE (POUR BTL) OPTIME
TOPICAL | Status: DC | PRN
  Administered 2018-03-18: 1000 mL

## 2018-03-18 MED ORDER — ACETAMINOPHEN 10 MG/ML IV SOLN: 1000.0000 mg | Freq: Once | INTRAVENOUS | Status: DC | PRN

## 2018-03-18 MED ORDER — INSULIN ASPART 100 UNIT/ML ~~LOC~~ SOLN
0.0000 [IU] | Freq: Three times a day (TID) | SUBCUTANEOUS | Status: DC
  Administered 2018-03-18: 8 [IU] via SUBCUTANEOUS

## 2018-03-18 MED ORDER — PROPOFOL 1000 MG/100ML IV EMUL
INTRAVENOUS | Status: AC
  Filled 2018-03-18: qty 100

## 2018-03-18 MED ORDER — FENTANYL CITRATE (PF) 250 MCG/5ML IJ SOLN
INTRAMUSCULAR | Status: AC
  Filled 2018-03-18: qty 5

## 2018-03-18 MED ORDER — GLIPIZIDE 5 MG PO TABS
7.5000 mg | ORAL_TABLET | Freq: Every day | ORAL | Status: DC
  Administered 2018-03-19: 7.5 mg via ORAL
  Filled 2018-03-18: qty 2

## 2018-03-18 MED ORDER — SIMETHICONE 80 MG PO CHEW: 40.0000 mg | CHEWABLE_TABLET | Freq: Four times a day (QID) | ORAL | Status: DC | PRN

## 2018-03-18 MED ORDER — PROPOFOL 500 MG/50ML IV EMUL
INTRAVENOUS | Status: DC | PRN
  Administered 2018-03-18: 125 ug/kg/min via INTRAVENOUS

## 2018-03-18 MED ORDER — PANTOPRAZOLE SODIUM 40 MG PO TBEC
40.0000 mg | DELAYED_RELEASE_TABLET | Freq: Every day | ORAL | Status: DC
  Administered 2018-03-19: 40 mg via ORAL
  Filled 2018-03-18: qty 1

## 2018-03-18 MED ORDER — DEXAMETHASONE SODIUM PHOSPHATE 10 MG/ML IJ SOLN
INTRAMUSCULAR | Status: AC
  Filled 2018-03-18: qty 1

## 2018-03-18 MED ORDER — TIMOLOL MALEATE 0.5 % OP SOLN: 1.0000 [drp] | Freq: Every day | OPHTHALMIC | Status: DC

## 2018-03-18 MED ORDER — LABETALOL HCL 5 MG/ML IV SOLN
INTRAVENOUS | Status: DC | PRN
  Administered 2018-03-18: 5 mg via INTRAVENOUS

## 2018-03-18 MED ORDER — CEFAZOLIN SODIUM-DEXTROSE 2-4 GM/100ML-% IV SOLN
2.0000 g | INTRAVENOUS | Status: AC
  Administered 2018-03-18: 2 g via INTRAVENOUS

## 2018-03-18 MED ORDER — BUPIVACAINE-EPINEPHRINE (PF) 0.25% -1:200000 IJ SOLN
INTRAMUSCULAR | Status: AC
  Filled 2018-03-18: qty 30

## 2018-03-18 MED ORDER — GABAPENTIN 300 MG PO CAPS
ORAL_CAPSULE | ORAL | Status: AC
  Filled 2018-03-18: qty 1

## 2018-03-18 MED ORDER — ENSURE PRE-SURGERY PO LIQD
296.0000 mL | Freq: Once | ORAL | Status: DC
  Filled 2018-03-18: qty 296

## 2018-03-18 MED ORDER — LABETALOL HCL 5 MG/ML IV SOLN
5.0000 mg | Freq: Once | INTRAVENOUS | Status: AC
  Administered 2018-03-18: 5 mg via INTRAVENOUS

## 2018-03-18 MED ORDER — GLIPIZIDE 5 MG PO TABS: 7.5000 mg | ORAL_TABLET | Freq: Every day | ORAL | Status: DC

## 2018-03-18 MED ORDER — OXYCODONE HCL 5 MG PO TABS: 5.0000 mg | ORAL_TABLET | Freq: Once | ORAL | Status: DC | PRN

## 2018-03-18 MED ORDER — LABETALOL HCL 5 MG/ML IV SOLN
INTRAVENOUS | Status: AC
  Filled 2018-03-18: qty 4

## 2018-03-18 MED ORDER — SODIUM CHLORIDE 0.9 % IV SOLN
INTRAVENOUS | Status: DC
  Administered 2018-03-18: 15:00:00 via INTRAVENOUS

## 2018-03-18 MED ORDER — ONDANSETRON HCL 4 MG/2ML IJ SOLN
INTRAMUSCULAR | Status: AC
  Filled 2018-03-18: qty 2

## 2018-03-18 MED ORDER — FENTANYL CITRATE (PF) 100 MCG/2ML IJ SOLN
INTRAMUSCULAR | Status: DC | PRN
  Administered 2018-03-18 (×2): 25 ug via INTRAVENOUS

## 2018-03-18 MED ORDER — ALBUTEROL SULFATE (2.5 MG/3ML) 0.083% IN NEBU: 3.0000 mL | INHALATION_SOLUTION | RESPIRATORY_TRACT | Status: DC | PRN

## 2018-03-18 MED ORDER — ACETAMINOPHEN 500 MG PO TABS
ORAL_TABLET | ORAL | Status: AC
  Administered 2018-03-18: 500 mg via ORAL
  Filled 2018-03-18: qty 2

## 2018-03-18 MED ORDER — PROPOFOL 500 MG/50ML IV EMUL: INTRAVENOUS | Status: DC | PRN

## 2018-03-18 MED ORDER — OXYCODONE HCL 5 MG/5ML PO SOLN: 5.0000 mg | Freq: Once | ORAL | Status: DC | PRN

## 2018-03-18 MED ORDER — DEXAMETHASONE SODIUM PHOSPHATE 10 MG/ML IJ SOLN
INTRAMUSCULAR | Status: DC | PRN
  Administered 2018-03-18: 5 mg via INTRAVENOUS

## 2018-03-18 MED ORDER — LOSARTAN POTASSIUM 50 MG PO TABS
25.0000 mg | ORAL_TABLET | Freq: Every day | ORAL | Status: DC
  Administered 2018-03-19: 25 mg via ORAL
  Filled 2018-03-18: qty 1

## 2018-03-18 MED ORDER — LACTATED RINGERS IV SOLN
INTRAVENOUS | Status: DC
  Administered 2018-03-18: 10:00:00 via INTRAVENOUS

## 2018-03-18 MED ORDER — INSULIN ASPART 100 UNIT/ML ~~LOC~~ SOLN: 0.0000 [IU] | Freq: Every day | SUBCUTANEOUS | Status: DC

## 2018-03-18 MED ORDER — OXYCODONE HCL 5 MG PO TABS: 5.0000 mg | ORAL_TABLET | ORAL | Status: DC | PRN

## 2018-03-18 MED ORDER — CEFAZOLIN SODIUM-DEXTROSE 2-4 GM/100ML-% IV SOLN
INTRAVENOUS | Status: AC
  Filled 2018-03-18: qty 100

## 2018-03-18 MED ORDER — ACETAMINOPHEN 500 MG PO TABS
1000.0000 mg | ORAL_TABLET | Freq: Four times a day (QID) | ORAL | Status: DC
  Administered 2018-03-18 – 2018-03-19 (×2): 1000 mg via ORAL
  Filled 2018-03-18 (×2): qty 2

## 2018-03-18 MED ORDER — ONDANSETRON 4 MG PO TBDP: 4.0000 mg | ORAL_TABLET | Freq: Four times a day (QID) | ORAL | Status: DC | PRN

## 2018-03-18 SURGICAL SUPPLY — 46 items
ADH SKN CLS APL DERMABOND .7 (GAUZE/BANDAGES/DRESSINGS) ×1
APPLIER CLIP 9.375 MED OPEN (MISCELLANEOUS)
APR CLP MED 9.3 20 MLT OPN (MISCELLANEOUS)
BINDER BREAST LRG (GAUZE/BANDAGES/DRESSINGS) IMPLANT
BINDER BREAST XLRG (GAUZE/BANDAGES/DRESSINGS) ×3 IMPLANT
CANISTER SUCT 3000ML PPV (MISCELLANEOUS) ×3 IMPLANT
CHLORAPREP W/TINT 26ML (MISCELLANEOUS) ×3 IMPLANT
CLIP APPLIE 9.375 MED OPEN (MISCELLANEOUS) IMPLANT
CLOSURE WOUND 1/2 X4 (GAUZE/BANDAGES/DRESSINGS) ×1
CONT SPEC 4OZ CLIKSEAL STRL BL (MISCELLANEOUS) ×3 IMPLANT
COVER SURGICAL LIGHT HANDLE (MISCELLANEOUS) ×3 IMPLANT
COVER WAND RF STERILE (DRAPES) ×3 IMPLANT
DERMABOND ADVANCED (GAUZE/BANDAGES/DRESSINGS) ×2
DERMABOND ADVANCED .7 DNX12 (GAUZE/BANDAGES/DRESSINGS) ×1 IMPLANT
DRAPE LAPAROSCOPIC ABDOMINAL (DRAPES) ×3 IMPLANT
ELECT CAUTERY BLADE 6.4 (BLADE) ×3 IMPLANT
ELECT REM PT RETURN 9FT ADLT (ELECTROSURGICAL) ×3
ELECTRODE REM PT RTRN 9FT ADLT (ELECTROSURGICAL) ×1 IMPLANT
GLOVE BIO SURGEON STRL SZ7 (GLOVE) ×3 IMPLANT
GLOVE BIOGEL PI IND STRL 7.5 (GLOVE) ×1 IMPLANT
GLOVE BIOGEL PI INDICATOR 7.5 (GLOVE) ×2
GOWN STRL REUS W/ TWL LRG LVL3 (GOWN DISPOSABLE) ×2 IMPLANT
GOWN STRL REUS W/TWL LRG LVL3 (GOWN DISPOSABLE) ×6
KIT BASIN OR (CUSTOM PROCEDURE TRAY) ×3 IMPLANT
KIT MARKER MARGIN INK (KITS) IMPLANT
KIT TURNOVER KIT B (KITS) ×3 IMPLANT
NEEDLE 18GX1X1/2 (RX/OR ONLY) (NEEDLE) IMPLANT
NEEDLE HYPO 25GX1X1/2 BEV (NEEDLE) ×3 IMPLANT
NS IRRIG 1000ML POUR BTL (IV SOLUTION) ×3 IMPLANT
PACK SURGICAL SETUP 50X90 (CUSTOM PROCEDURE TRAY) ×3 IMPLANT
PAD ABD 8X10 STRL (GAUZE/BANDAGES/DRESSINGS) ×3 IMPLANT
PAD ARMBOARD 7.5X6 YLW CONV (MISCELLANEOUS) ×6 IMPLANT
PENCIL BUTTON HOLSTER BLD 10FT (ELECTRODE) ×3 IMPLANT
SPONGE LAP 18X18 X RAY DECT (DISPOSABLE) ×3 IMPLANT
STRIP CLOSURE SKIN 1/2X4 (GAUZE/BANDAGES/DRESSINGS) ×2 IMPLANT
SUT MON AB 4-0 PC3 18 (SUTURE) IMPLANT
SUT SILK 2 0 SH (SUTURE) IMPLANT
SUT VIC AB 3-0 SH 27 (SUTURE) ×3
SUT VIC AB 3-0 SH 27XBRD (SUTURE) ×1 IMPLANT
SYR BULB 3OZ (MISCELLANEOUS) ×3 IMPLANT
SYR CONTROL 10ML LL (SYRINGE) ×3 IMPLANT
TOWEL OR 17X24 6PK STRL BLUE (TOWEL DISPOSABLE) ×3 IMPLANT
TOWEL OR 17X26 10 PK STRL BLUE (TOWEL DISPOSABLE) ×3 IMPLANT
TUBE CONNECTING 12'X1/4 (SUCTIONS) ×1
TUBE CONNECTING 12X1/4 (SUCTIONS) ×2 IMPLANT
YANKAUER SUCT BULB TIP NO VENT (SUCTIONS) ×3 IMPLANT

## 2018-03-18 NOTE — Progress Notes (Signed)
Dr. Ermalene Postin made aware of pt's cbg readings.

## 2018-03-18 NOTE — Discharge Instructions (Signed)
[facility] Office Phone Number [phone]  BREAST BIOPSY/ PARTIAL MASTECTOMY: POST OP INSTRUCTIONS Take 400 mg of ibuprofen every 8 hours or 650 mg tylenol every 6 hours for next 72 hours then as needed. Use ice several times daily also. Always review your discharge instruction sheet given to you by the facility where your surgery was performed.  IF YOU HAVE DISABILITY OR FAMILY LEAVE FORMS, YOU MUST BRING THEM TO THE OFFICE FOR PROCESSING.  DO NOT GIVE THEM TO YOUR DOCTOR.  1. A prescription for pain medication may be given to you upon discharge.  Take your pain medication as prescribed, if needed.  If narcotic pain medicine is not needed, then you may take acetaminophen (Tylenol), naprosyn (Alleve) or ibuprofen (Advil) as needed. 2. Take your usually prescribed medications unless otherwise directed 3. If you need a refill on your pain medication, please contact your pharmacy.  They will contact our office to request authorization.  Prescriptions will not be filled after 5pm or on week-ends. 4. You should eat very light the first 24 hours after surgery, such as soup, crackers, pudding, etc.  Resume your normal diet the day after surgery. 5. Most patients will experience some swelling and bruising in the breast.  Ice packs and a good support bra will help.  Wear the breast binder provided or a sports bra for 72 hours day and night.  After that wear a sports bra during the day until you return to the office. Swelling and bruising can take several days to resolve.  6. It is common to experience some constipation if taking pain medication after surgery.  Increasing fluid intake and taking a stool softener will usually help or prevent this problem from occurring.  A mild laxative (Milk of Magnesia or Miralax) should be taken according to package directions if there are no bowel movements after 48 hours. 7. Unless discharge instructions indicate otherwise, you may remove your bandages 48  hours after surgery and you may shower at that time.  You may have steri-strips (small skin tapes) in place directly over the incision.  These strips should be left on the skin for 7-10 days and will come off on their own.  If your surgeon used skin glue on the incision, you may shower in 24 hours.  The glue will flake off over the next 2-3 weeks.  Any sutures or staples will be removed at the office during your follow-up visit. 8. ACTIVITIES:  You may resume regular daily activities (gradually increasing) beginning the next day.  Wearing a good support bra or sports bra minimizes pain and swelling.  You may have sexual intercourse when it is comfortable. a. You may drive when you no longer are taking prescription pain medication, you can comfortably wear a seatbelt, and you can safely maneuver your car and apply brakes. b. RETURN TO WORK:  ______________________________________________________________________________________ 9. You should see your doctor in the office for a follow-up appointment approximately two weeks after your surgery.  Your doctor's nurse will typically make your follow-up appointment when she calls you with your pathology report.  Expect your pathology report 3-4 business days after your surgery.  You may call to check if you do not hear from us after three days. 10. OTHER INSTRUCTIONS: _______________________________________________________________________________________________ _____________________________________________________________________________________________________________________________________ _____________________________________________________________________________________________________________________________________ _____________________________________________________________________________________________________________________________________  WHEN TO CALL DR Deborra Phegley: 1. Fever over 101.0 2. Nausea and/or vomiting. 3. Extreme swelling or  bruising. 4. Continued bleeding from incision. 5. Increased pain, redness, or drainage from the incision.  The clinic   staff is available to answer your questions during regular business hours.  Please don't hesitate to call and ask to speak to one of the nurses for clinical concerns.  If you have a medical emergency, go to the nearest emergency room or call 911.  A surgeon from [facility] is always on call at the hospital.  For further questions, please visit [email or web address]  

## 2018-03-18 NOTE — Interval H&P Note (Signed)
History and Physical Interval Note:  03/18/2018 10:02 AM  Victoria Vasquez  has presented today for surgery, with the diagnosis of LEFT BREAST PAGETS DISEASE  The various methods of treatment have been discussed with the patient and family. After consideration of risks, benefits and other options for treatment, the patient has consented to  Procedure(s): LEFT BREAST CENTRAL  LUMPECTOMY (Left) as a surgical intervention .  The patient's history has been reviewed, patient examined, no change in status, stable for surgery.  I have reviewed the patient's chart and labs.  Questions were answered to the patient's satisfaction.     Rolm Bookbinder

## 2018-03-18 NOTE — Anesthesia Procedure Notes (Signed)
Procedure Name: LMA Insertion Date/Time: 03/18/2018 10:25 AM Performed by: Kathryne Hitch, CRNA Pre-anesthesia Checklist: Patient identified, Emergency Drugs available, Suction available, Patient being monitored and Timeout performed Patient Re-evaluated:Patient Re-evaluated prior to induction Oxygen Delivery Method: Circle system utilized Preoxygenation: Pre-oxygenation with 100% oxygen Induction Type: IV induction LMA: LMA inserted LMA Size: 4.0 Number of attempts: 1 Airway Equipment and Method: Patient positioned with wedge pillow Placement Confirmation: positive ETCO2 and breath sounds checked- equal and bilateral Tube secured with: Tape Dental Injury: Teeth and Oropharynx as per pre-operative assessment

## 2018-03-18 NOTE — Transfer of Care (Signed)
Immediate Anesthesia Transfer of Care Note  Patient: Victoria Vasquez  Procedure(s) Performed: LEFT BREAST CENTRAL  LUMPECTOMY (Left Breast)  Patient Location: PACU  Anesthesia Type:General  Level of Consciousness: awake and drowsy  Airway & Oxygen Therapy: Patient Spontanous Breathing and Patient connected to face mask oxygen  Post-op Assessment: Report given to RN, Post -op Vital signs reviewed and stable and Patient moving all extremities  Post vital signs: Reviewed and stable  Last Vitals:  Vitals Value Taken Time  BP 146/70 03/18/2018 11:26 AM  Temp    Pulse 59 03/18/2018 11:27 AM  Resp 10 03/18/2018 11:27 AM  SpO2 100 % 03/18/2018 11:27 AM  Vitals shown include unvalidated device data.  Last Pain:  Vitals:   03/18/18 1003  TempSrc:   PainSc: 5          Complications: No apparent anesthesia complications

## 2018-03-18 NOTE — Op Note (Signed)
Preoperative diagnosis: left breast Pagets disease Postoperative diagnosis: Same as above Procedure: Left breast central lumpectomy Surgeon: Dr. Serita Grammes Anesthesia: General Estimated blood loss: Less than 30 cc Specimens: left breast tissue marked with paint Complications: None Drains: None Sponge needle count was correct at completion Disposition to recovery in stable condition  Indications: This isan 5 yof with Pagets disease.  She was under observation at outside facility. This has become much more symptomatic and she was referred to have this excised.  We discussed excision of central breast including nipple and areola.  Procedure: After informed consent was obtained the patient was taken to the OR. She was administered antibiotics. SCDs were in place. She was then placed under general anesthesia without complication. She was prepped and draped in the standard sterile surgical fashion. A surgical timeout was then performed.  I made an elliptical incision surrounding the nipple and areola.  I then used cautery to remove the central breast including the NAC.  This was marked with paint.   Hemostasis was obtained. I closed the breast tissue with 2-0 vicryl The remaining tissue was closed with 3-0 Vicryl and4-0 Monocryl. Glue and Steri-Strips were applied as well.  A binder was placed.She tolerated this well was extubated and transferred to the recovery room stable.

## 2018-03-18 NOTE — H&P (Signed)
84 yof referred by Dr Wolfgang Phoenix for Pagets disease. for some time she has left nipple/areolar changes with bleeding noted. she underwent a left breast biopsy at outside facility 10/05/80 under mac that was pagets disease. was seen by oncology and recommended to have surgery. she then followed up with surgery at other facility and was deemed to high risk for surgery and was just being observed. the area is causing pain, is now ulcerated and is bleeding. Dr Wolfgang Phoenix, her pcp, asked if it was possible to do surgery and she is here with her daughter today to discuss. she is fairly functional around house. uses cane outside of house due to vision primarily. imaging in may shows left nipple changes with asymmetric thickening, erythema and scaling. there is no definite retroareolar mass present.   Past Surgical History Victoria Vasquez, RMA; 03/09/2018 1:57 PM) Appendectomy  Breast Biopsy  Left. Carotid Artery Surgery  Left. Gallbladder Surgery - Laparoscopic   Diagnostic Studies History Victoria Vasquez, RMA; 03/09/2018 1:57 PM) Colonoscopy  1-5 years ago Mammogram  within last year Pap Smear  >5 years ago  Allergies Marguarite Arbour, RMA; 03/09/2018 1:57 PM) No Known Drug Allergies [03/09/2018]: Allergies Reconciled   Medication History [employer], RMA; 03/09/2018 1:58 PM) Latanoprost (0.005% Solution, Ophthalmic) Active. Losartan Potassium (25MG Tablet, Oral) Active. Pantoprazole Sodium (40MG Tablet DR, Oral) Active. Symbicort (160-4.5MCG/ACT Aerosol, Inhalation) Active. Aspirin (81MG Tablet DR, Oral) Active. glipiZIDE (5MG Tablet, Oral) Active. Tylenol (325MG Tablet, Oral) Active. ZyrTEC Allergy (10MG Tablet, Oral) Active. OneTouch Ultra Blue (In Vitro) Active. Medications Reconciled  Social History Victoria Vasquez, RMA; 03/09/2018 1:57 PM) Caffeine use  Coffee, Tea. No alcohol use  No drug use  Tobacco use  Never smoker.  Family  History Victoria Vasquez [place], RMA; 03/09/2018 1:57 PM) Alcohol Abuse  Brother. Anesthetic complications  Daughter. Cancer  Father, Mother. Depression  Daughter. Diabetes Mellitus  Sister. Hypertension  Sister. Migraine Headache  Daughter. Respiratory Condition  Brother. Thyroid problems  Daughter.  Pregnancy / Birth History Victoria Vasquez, RMA; 03/09/2018 1:57 PM) Age at menarche  84 years. Age of menopause  9-50 Gravida  7 Maternal age  56-25 Para  3 Regular periods   Other Problems (Victoria Vasquez, RMA; 03/09/2018 1:57 PM) Asthma  Back Pain  Breast Cancer  Chronic Obstructive Lung Disease  Diabetes Mellitus  Gastroesophageal Reflux Disease  High blood pressure  Hypercholesterolemia  Lump In Breast  Migraine Headache  Other disease, cancer, significant illness  Pulmonary Embolism / Blood Clot in Legs  Transfusion history    Review of Systems [employer] Haggett RMA; 03/09/2018 1:57 PM) General Present- Fatigue. Not Present- Appetite Loss, Chills, Fever, Night Sweats, Weight Gain and Weight Loss. Skin Present- Dryness, New Lesions, Non-Healing Wounds and Ulcer. Not Present- Change in Wart/Mole, Hives, Jaundice and Rash. HEENT Present- Hearing Loss, Visual Disturbances and Wears glasses/contact lenses. Not Present- Earache, Hoarseness, Nose Bleed, Oral Ulcers, Ringing in the Ears, Seasonal Allergies, Sinus Pain, Sore Throat and Yellow Eyes. Respiratory Present- Chronic Cough, Difficulty Breathing, Snoring and Wheezing. Not Present- Bloody sputum. Breast Present- Breast Pain and Nipple Discharge. Not Present- Breast Mass and Skin Changes. Cardiovascular Present- Difficulty Breathing Lying Down, Leg Cramps and Shortness of Breath. Not Present- Chest Pain, Palpitations, Rapid Heart Rate and Swelling of Extremities. Gastrointestinal Present- Difficulty Swallowing and Excessive gas. Not Present- Abdominal Pain, Bloating, Bloody Stool, Change in  Bowel Habits, Chronic diarrhea, Constipation, Gets full quickly at meals, Hemorrhoids, Indigestion, Nausea, Rectal Pain and Vomiting. Female Genitourinary Not Present- Frequency, Nocturia, Painful Urination,  Pelvic Pain and Urgency. Musculoskeletal Present- Back Pain, Joint Pain and Muscle Pain. Not Present- Joint Stiffness, Muscle Weakness and Swelling of Extremities. Neurological Present- Weakness. Not Present- Decreased Memory, Fainting, Headaches, Numbness, Seizures, Tingling, Tremor and Trouble walking. Psychiatric Not Present- Anxiety, Bipolar, Change in Sleep Pattern, Depression, Fearful and Frequent crying. Endocrine Present- Cold Intolerance. Not Present- Excessive Hunger, Hair Changes, Heat Intolerance, Hot flashes and New Diabetes. Hematology Not Present- Blood Thinners, Easy Bruising, Excessive bleeding, Gland problems, HIV and Persistent Infections.  Vitals Victoria Vasquez [place] RMA; 03/09/2018 1:58 PM) 03/09/2018 1:58 PM Weight: 180.2 lb Height: 63in Body Surface Area: 1.85 m Body Mass Index: 31.92 kg/m  Temp.: 96.23F(Temporal)  Pulse: 96 (Regular)  P.OX: 99% (Room air) BP: 174/96 (Sitting, Left Arm, Standard) Physical Exam Rolm Bookbinder MD; 03/09/2018 3:12 PM) General Mental Status-Alert. Head and Neck Trachea-midline. Thyroid -Note: large midline/left lobe mass mobile. Eye Sclera/Conjunctiva - Bilateral-No scleral icterus. Breast Breast Lump-No Palpable Breast Mass. Note: left nipple areola ulcerated, bleeding tender Neurologic Neurologic evaluation reveals -alert and oriented x 3 with no impairment of recent or remote memory. Lymphatic Head & Neck General Head & Neck Lymphatics: Bilateral - Description - Normal. Axillary General Axillary Region: Bilateral - Description - Normal. Note: no [place] adenopathy   Assessment & Plan Rolm Bookbinder MD; 03/10/2018 4:07 PM) PAGETS DISEASE, BREAST (C50.019) Story: Left breast central  lumpectomy this area continues to progress and is going to impact quality of life issues more as time goes on outside of her age she doesnt really have any cormorbidities that preclude surgery. on the [place] perioperative risk assessment she has .17 % change of periop cardiac event. A concern is anesthesia effect on her neurologically given age but I think we can do this with combination of sedation, regional block and local to do basically central lumpectomy. she did well with prior biopsy under mac. will keep her overnight, discussed surgery and risks and she and her daughter are willing to proceed

## 2018-03-19 ENCOUNTER — Encounter (HOSPITAL_COMMUNITY): Payer: Self-pay | Admitting: General Surgery

## 2018-03-19 DIAGNOSIS — D0512 Intraductal carcinoma in situ of left breast: Secondary | ICD-10-CM | POA: Diagnosis not present

## 2018-03-19 LAB — GLUCOSE, CAPILLARY: Glucose-Capillary: 142 mg/dL — ABNORMAL HIGH (ref 70–99)

## 2018-03-19 NOTE — Progress Notes (Cosign Needed)
1 Day Post-Op   Subjective/Chief Complaint: Patient reports no overnight issues, pain is well controlled rating it 3/10 in the pain scale. Patient has been eating and drinking with no issues, no nausea or vomiting. Patient has been using the bedside commode but has not yet had a BM, last BM was 3 days ago. Patient has not been ambulating but believe she could today with the walker. Lives with daughter who will be able to assist with at home recovery.  Objective: Vital signs in last 24 hours: Temp:  [97 F (36.1 C)-98 F (36.7 C)] 98 F (36.7 C) (11/01 0500) Pulse Rate:  [47-71] 62 (11/01 0500) Resp:  [7-20] 17 (11/01 0500) BP: (146-201)/(70-107) 152/76 (11/01 0500) SpO2:  [93 %-100 %] 100 % (11/01 0500) Weight:  [82.1 kg] 82.1 kg (10/31 1003)    Intake/Output from previous day: 10/31 0701 - 11/01 0700 In: 665.3 [P.O.:80; I.V.:585.3] Out: 5 [Blood:5] Intake/Output this shift: Total I/O In: -  Out: 300 [Urine:300]  General appearance: alert, cooperative and no distress Resp: clear to auscultation bilaterally Breasts: little tenderness with palpation Cardio: regular rate and rhythm, S1, S2 normal, no murmur, click, rub or gallop GI: BS present Incision/Wound: mild dried blood without purulent drainage.   Lab Results:  BMET Glucose at 20:55 pm last night: 321 mg/dL. Today at 8:11: 142 mg/dL  FEN: no restrictions VTE: SCD in place ID: none Foley: N/A  Assessment/Plan: POD #1 for left breast central lumpectomy  Ambulate with walker and assistance  Start home meds D/c home  Magnus Ivan 03/19/2018

## 2018-03-19 NOTE — Discharge Summary (Signed)
Physician Discharge Summary  Patient ID: Victoria Vasquez MRN: [id number] DOB/AGE: Mar 03, 1928 84 y.o.  Admit date: 03/18/2018 Discharge date: 03/19/2018  Admission Diagnoses: Pagets disease gerd Dm Copd   Discharge Diagnoses:  Active Problems:   Paget's disease and intraductal carcinoma of breast, left [facility])   Discharged Condition: good  Hospital Course: 89 yof who underwent central lumpectomy for symptomatic pagets disease. She did well. Glucose was well controlled in am following surgery and she was doing well.   Consults: none  Significant Diagnostic Studies:none  Treatments: left central lumpectomy    Disposition: home   Allergies as of 03/19/2018      Reactions   Lisinopril Cough   Statins Other (See Comments)   Myalgias      Medication List    TAKE these medications   acetaminophen 500 MG tablet Commonly known as:  TYLENOL Take 500 mg by mouth every 6 (six) hours as needed for moderate pain or headache.   albuterol 108 (90 Base) MCG/ACT inhaler Commonly known as:  PROVENTIL HFA;VENTOLIN HFA Use 2 puffs every 4 hours as needed for shortness of breath/wheezing What changed:    how much to take  how to take this  when to take this  reasons to take this  additional instructions   aspirin 81 MG tablet Take 81 mg by mouth daily.   budesonide-formoterol 160-4.5 MCG/ACT inhaler Commonly known as:  SYMBICORT USE 2 PUFFS TWICE DAILY FOR ASTHMA. RINSE MOUTH AFTER USE. What changed:    how much to take  how to take this  when to take this  additional instructions   glipiZIDE 5 MG tablet Commonly known as:  GLUCOTROL Take 1 and a half tablets  in the a.m. And 1/2 in p.m. What changed:    how much to take  how to take this  when to take this  additional instructions   latanoprost 0.005 % ophthalmic solution Commonly known as:  XALATAN Place 1 drop into both eyes at bedtime.   losartan 25 MG tablet Commonly known as:  COZAAR Take 1  tablet (25 mg total) by mouth daily.   mupirocin ointment 2 % Commonly known as:  BACTROBAN Apply thin amount on affected area daily   pantoprazole 40 MG tablet Commonly known as:  PROTONIX TAKE 1 TABLET BY MOUTH ONCE DAILY.   PRESERVISION AREDS 2+MULTI VIT PO Take 1 capsule by mouth 2 (two) times daily.   timolol 0.5 % ophthalmic solution Commonly known as:  BETIMOL Place 1 drop into both eyes daily.   traMADol 50 MG tablet Commonly known as:  ULTRAM Take 1 tablet (50 mg total) by mouth every 6 (six) hours as needed.      Follow-up Information    Rolm Bookbinder, MD In 3 weeks.   Specialty:  General Surgery Contact information: [place] [address] [phone]           Signed: Rolm Bookbinder 03/19/2018, 4:30 PM

## 2018-03-29 ENCOUNTER — Encounter (HOSPITAL_BASED_OUTPATIENT_CLINIC_OR_DEPARTMENT_OTHER): Payer: PPO | Attending: Internal Medicine

## 2018-03-29 DIAGNOSIS — L97512 Non-pressure chronic ulcer of other part of right foot with fat layer exposed: Secondary | ICD-10-CM | POA: Insufficient documentation

## 2018-03-29 DIAGNOSIS — Z7984 Long term (current) use of oral hypoglycemic drugs: Secondary | ICD-10-CM | POA: Insufficient documentation

## 2018-03-29 DIAGNOSIS — S91301A Unspecified open wound, right foot, initial encounter: Secondary | ICD-10-CM | POA: Diagnosis not present

## 2018-03-29 DIAGNOSIS — E1142 Type 2 diabetes mellitus with diabetic polyneuropathy: Secondary | ICD-10-CM | POA: Diagnosis not present

## 2018-03-29 DIAGNOSIS — I10 Essential (primary) hypertension: Secondary | ICD-10-CM | POA: Insufficient documentation

## 2018-03-29 DIAGNOSIS — J449 Chronic obstructive pulmonary disease, unspecified: Secondary | ICD-10-CM | POA: Diagnosis not present

## 2018-03-29 DIAGNOSIS — Z85828 Personal history of other malignant neoplasm of skin: Secondary | ICD-10-CM | POA: Diagnosis not present

## 2018-03-29 DIAGNOSIS — E1140 Type 2 diabetes mellitus with diabetic neuropathy, unspecified: Secondary | ICD-10-CM | POA: Diagnosis not present

## 2018-03-29 DIAGNOSIS — E11621 Type 2 diabetes mellitus with foot ulcer: Secondary | ICD-10-CM | POA: Diagnosis not present

## 2018-03-30 ENCOUNTER — Ambulatory Visit (INDEPENDENT_AMBULATORY_CARE_PROVIDER_SITE_OTHER): Payer: PPO | Admitting: Family Medicine

## 2018-03-30 VITALS — BP 138/92 | Ht 63.0 in | Wt 179.0 lb

## 2018-03-30 DIAGNOSIS — C50012 Malignant neoplasm of nipple and areola, left female breast: Secondary | ICD-10-CM

## 2018-03-30 DIAGNOSIS — E1122 Type 2 diabetes mellitus with diabetic chronic kidney disease: Secondary | ICD-10-CM | POA: Diagnosis not present

## 2018-03-30 DIAGNOSIS — R05 Cough: Secondary | ICD-10-CM | POA: Diagnosis not present

## 2018-03-30 DIAGNOSIS — R053 Chronic cough: Secondary | ICD-10-CM

## 2018-03-30 DIAGNOSIS — N183 Chronic kidney disease, stage 3 (moderate): Secondary | ICD-10-CM

## 2018-03-30 DIAGNOSIS — E049 Nontoxic goiter, unspecified: Secondary | ICD-10-CM

## 2018-03-30 DIAGNOSIS — E1165 Type 2 diabetes mellitus with hyperglycemia: Secondary | ICD-10-CM | POA: Diagnosis not present

## 2018-03-30 DIAGNOSIS — I10 Essential (primary) hypertension: Secondary | ICD-10-CM | POA: Diagnosis not present

## 2018-03-30 DIAGNOSIS — IMO0002 Reserved for concepts with insufficient information to code with codable children: Secondary | ICD-10-CM

## 2018-03-30 MED ORDER — LOSARTAN POTASSIUM 50 MG PO TABS: 50.0000 mg | ORAL_TABLET | Freq: Every day | ORAL | 4 refills | Status: DC

## 2018-03-30 MED ORDER — FLUTICASONE PROPIONATE 50 MCG/ACT NA SUSP: 2.0000 | Freq: Every day | NASAL | 6 refills | Status: AC

## 2018-03-30 NOTE — Progress Notes (Signed)
Subjective:    Patient ID: Victoria Vasquez, female    DOB: Nov 05, 1927, 84 y.o.   MRN: [id number]  HPI Patient is here today to follow up on her bp.She says she had breast surgery on 03/18/2018 and her bp was elevated. She also says she went to the wound care center yesterday for right foot ulcer between the fourth and the fifth toes, she says they were more concerned about her bp than her foot and they suggested that she have her pcp evaluate. She takes losartan 25 mg one daily.She reports her bp's have been running 190/100. Bp today is 172/100 and she says she just took her Losartan this am at 10 am.  Patient states sugars have been up recently A1c before her last visit was 9.3 Patient still taking glipizide 1 in the morning half in the evening She could not afford other medications Kidney functions are such to wear cannot use metformin   Patient does have chronic cough this been going on for months It is doubtful that losartan is causing this She does occasionally get wheezing so therefore it is possible there could be underlying lung issue possible COPD she is using Symbicort and albuterol and it helps some Review of Systems  Constitutional: Negative for activity change, appetite change and fatigue.  HENT: Negative for congestion and rhinorrhea.   Respiratory: Positive for cough. Negative for shortness of breath and wheezing.   Cardiovascular: Negative for chest pain and leg swelling.  Gastrointestinal: Negative for abdominal pain and diarrhea.  Endocrine: Negative for polydipsia and polyphagia.  Skin: Negative for color change.  Neurological: Negative for dizziness and weakness.  Psychiatric/Behavioral: Negative for behavioral problems and confusion.       Objective:   Physical Exam  Constitutional: She appears well-developed and well-nourished. No distress.  HENT:  Head: Normocephalic and atraumatic.  Eyes: Right eye exhibits no discharge. Left eye exhibits no discharge.    Neck: No tracheal deviation present.  Cardiovascular: Normal rate, regular rhythm and normal heart sounds.  No murmur heard. Pulmonary/Chest: Effort normal and breath sounds normal. No respiratory distress. She has no wheezes. She has no rales.  Musculoskeletal: She exhibits no edema.  Lymphadenopathy:    She has no cervical adenopathy.  Neurological: She is alert. She exhibits normal muscle tone.  Skin: Skin is warm and dry. No erythema.  Psychiatric: Her behavior is normal.  Vitals reviewed. Patient states enlarged thyroid rupture attention at the time of surgery she states she does feel out fullness in her throat every time she swallows does not have any trouble breathing because of denies any severe pain with it.  Has never noticed this before.  The  Patient has generalized thyroid enlargement no nodules felt she states she feels it when she swallows we will do ultrasound and lab work to look at this      Assessment & Plan:  Reactive airway Chronic cough Patient had a chest x-ray earlier in May May need up-to-date chest x-ray pulmonary function Currently we will go ahead and refer to pulmonology for their opinion Continue Symbicort and albuterol  Blood pressure subpar control recommend adjusting losartan new dose 50 mg daily follow-up in a few weeks bring blood pressure cuff with her when she comes   Diabetes-patient was encouraged to watch not write her readings now bring them with her when she comes in few weeks time so we can make adjustments on her medicine  Paget's disease will have her follow-up with oncology we  have sent them a note but we will call hematology oncology to have them set her up for a follow-up appointment  25 minutes was spent with the patient.  This statement verifies that 25 minutes was indeed spent with the patient.  More than 50% of this visit-total duration of the visit-was spent in counseling and coordination of care. The issues that the patient came  in for today as reflected in the diagnosis (s) please refer to documentation for further details.

## 2018-03-30 NOTE — Patient Instructions (Addendum)
Recheck here in 3 to 4 weeks  Bring your cuff with you  Use flonase daily  We will set up appt with Dr Luan Pulling  New dose Losartan is 50 mg daily  Call if questions  For the thyroid I recommend an ultrasound and lab work to be on the safe side More than likely will be OK  Please bring glucose readings with you on next visit

## 2018-03-30 NOTE — Anesthesia Postprocedure Evaluation (Signed)
Anesthesia Post Note  Patient: BRISIA SCHUERMANN  Procedure(s) Performed: LEFT BREAST CENTRAL  LUMPECTOMY (Left Breast)     Patient location during evaluation: PACU Anesthesia Type: General Level of consciousness: awake and alert Pain management: pain level controlled Vital Signs Assessment: post-procedure vital signs reviewed and stable Respiratory status: spontaneous breathing, nonlabored ventilation, respiratory function stable and patient connected to nasal cannula oxygen Cardiovascular status: blood pressure returned to baseline and stable Postop Assessment: no apparent nausea or vomiting Anesthetic complications: no    Last Vitals:  Vitals:   03/19/18 0502 03/19/18 0902  BP: (!) 143/56   Pulse: 61   Resp: 18   Temp: 36.9 C   SpO2: 96% 94%    Last Pain:  Vitals:   03/19/18 1000  TempSrc:   PainSc: 0-No pain                 Ruqayyah Lute

## 2018-03-31 LAB — T4, FREE: Free T4: 1.16 ng/dL (ref 0.82–1.77)

## 2018-03-31 LAB — TSH: TSH: 1.32 u[IU]/mL (ref 0.450–4.500)

## 2018-04-02 ENCOUNTER — Encounter: Payer: Self-pay | Admitting: Family Medicine

## 2018-04-02 DIAGNOSIS — H401111 Primary open-angle glaucoma, right eye, mild stage: Secondary | ICD-10-CM | POA: Diagnosis not present

## 2018-04-04 ENCOUNTER — Telehealth: Payer: Self-pay | Admitting: Family Medicine

## 2018-04-04 NOTE — Telephone Encounter (Signed)
Nurses-please let the patient know that I spoke with Dr. Donne Hazel regarding if she needed to see the oncologist again He stated that with her type of breast cancer given that her path report overall looks good he recommended that she do her regular primary care visit follow-ups with [place] Dr. Donne Hazel also stated that she will need to do a visit with him every 6 months Dr. Donne Hazel also stated that he did not feel she needed to see the oncologist unless another problem occurred Please make sure patient understands thank you Document accordingly

## 2018-04-05 NOTE — Telephone Encounter (Signed)
I called and spoke with the pt and she states understanding and will follow up with [place] and has an appt with Dr.Wakefield 04/09/2018 and she plans on keeping this appt.

## 2018-04-06 DIAGNOSIS — M79671 Pain in right foot: Secondary | ICD-10-CM | POA: Diagnosis not present

## 2018-04-06 DIAGNOSIS — S91301A Unspecified open wound, right foot, initial encounter: Secondary | ICD-10-CM | POA: Diagnosis not present

## 2018-04-06 DIAGNOSIS — E11621 Type 2 diabetes mellitus with foot ulcer: Secondary | ICD-10-CM | POA: Diagnosis not present

## 2018-04-07 ENCOUNTER — Ambulatory Visit (HOSPITAL_COMMUNITY): Payer: PPO

## 2018-04-08 ENCOUNTER — Ambulatory Visit (HOSPITAL_COMMUNITY)
Admission: RE | Admit: 2018-04-08 | Discharge: 2018-04-08 | Disposition: A | Discharge status: Home / Self Care | Payer: PPO | Source: Ambulatory Visit | Attending: Family Medicine | Admitting: Family Medicine

## 2018-04-08 DIAGNOSIS — E042 Nontoxic multinodular goiter: Secondary | ICD-10-CM | POA: Diagnosis not present

## 2018-04-08 DIAGNOSIS — E049 Nontoxic goiter, unspecified: Secondary | ICD-10-CM | POA: Diagnosis present

## 2018-04-09 ENCOUNTER — Ambulatory Visit (HOSPITAL_COMMUNITY): Payer: PPO

## 2018-04-09 ENCOUNTER — Other Ambulatory Visit: Payer: Self-pay | Admitting: Family Medicine

## 2018-04-09 DIAGNOSIS — E049 Nontoxic goiter, unspecified: Secondary | ICD-10-CM

## 2018-04-19 ENCOUNTER — Ambulatory Visit (INDEPENDENT_AMBULATORY_CARE_PROVIDER_SITE_OTHER): Payer: PPO | Admitting: Family Medicine

## 2018-04-19 ENCOUNTER — Encounter: Payer: Self-pay | Admitting: Family Medicine

## 2018-04-19 ENCOUNTER — Telehealth: Payer: Self-pay | Admitting: *Deleted

## 2018-04-19 ENCOUNTER — Other Ambulatory Visit: Payer: Self-pay | Admitting: Family Medicine

## 2018-04-19 VITALS — BP 138/88 | Ht 63.0 in | Wt 179.2 lb

## 2018-04-19 DIAGNOSIS — E1165 Type 2 diabetes mellitus with hyperglycemia: Secondary | ICD-10-CM

## 2018-04-19 DIAGNOSIS — C50012 Malignant neoplasm of nipple and areola, left female breast: Secondary | ICD-10-CM

## 2018-04-19 DIAGNOSIS — N183 Chronic kidney disease, stage 3 (moderate): Secondary | ICD-10-CM

## 2018-04-19 DIAGNOSIS — E1122 Type 2 diabetes mellitus with diabetic chronic kidney disease: Secondary | ICD-10-CM

## 2018-04-19 DIAGNOSIS — E041 Nontoxic single thyroid nodule: Secondary | ICD-10-CM | POA: Diagnosis not present

## 2018-04-19 DIAGNOSIS — I10 Essential (primary) hypertension: Secondary | ICD-10-CM

## 2018-04-19 DIAGNOSIS — IMO0002 Reserved for concepts with insufficient information to code with codable children: Secondary | ICD-10-CM

## 2018-04-19 NOTE — Patient Instructions (Signed)
Overall your blood pressure looks good when I checked it with the wall unit it was within normal limits  Your electronic machine is reading your blood pressure probably 15-20 points higher than what it really is  I would like to see you back in early February  We are working on set you up with Dr. Dorris Fetch for your thyroid.

## 2018-04-19 NOTE — Progress Notes (Signed)
Subjective:    Patient ID: Victoria Vasquez, female    DOB: October 17, 1927, 84 y.o.   MRN: [id number]  Hypertension  This is a chronic problem. Associated symptoms include shortness of breath. Pertinent negatives include no chest pain. Treatments tried: losartan was increased to 50mg  at last visit. Compliance problems include exercise (takes meds every day, eats healthy, not able to do much exercise).    Pt brought in blood sugar readings. Last A1C 9.3 2 months ago.  Her diabetes not under good control she does try to follow a healthy diet She takes her medicine Cannot afford a higher cost medicine Denies any complications currently  She has a chronic cough which lingers she uses her inhalers on a regular basis She states she sometimes forgets taking her evening dose of Symbicort She saw her doctor for Paget's disease they are recommending just watching the area that they did surgery on the did not feel chemo or radiation indicated She does states she feels a lump in her throat every time she swallows and that makes it difficult on her she is supposed to see Dr. Dorris Fetch in the coming weeks Review of Systems  Constitutional: Negative for activity change, appetite change and fatigue.  HENT: Negative for congestion and rhinorrhea.   Respiratory: Positive for cough and shortness of breath.   Cardiovascular: Negative for chest pain and leg swelling.  Gastrointestinal: Negative for abdominal pain and diarrhea.  Endocrine: Negative for polydipsia and polyphagia.  Skin: Negative for color change.  Neurological: Negative for dizziness and weakness.  Psychiatric/Behavioral: Negative for behavioral problems and confusion.       Objective:   Physical Exam  Constitutional: She appears well-nourished. No distress.  HENT:  Head: Normocephalic and atraumatic.  Eyes: Right eye exhibits no discharge. Left eye exhibits no discharge.  Neck: No tracheal deviation present.  Cardiovascular: Normal rate,  regular rhythm and normal heart sounds.  No murmur heard. Pulmonary/Chest: Effort normal and breath sounds normal. No respiratory distress.  Musculoskeletal: She exhibits no edema.  Lymphadenopathy:    She has no cervical adenopathy.  Neurological: She is alert. Coordination normal.  Skin: Skin is warm and dry.  Psychiatric: She has a normal mood and affect. Her behavior is normal.  Vitals reviewed.  25 minutes was spent with the patient.  This statement verifies that 25 minutes was indeed spent with the patient.  More than 50% of this visit-total duration of the visit-was spent in counseling and coordination of care. The issues that the patient came in for today as reflected in the diagnosis (s) please refer to documentation for further details.  Patient does have thyroid enlargement but no tracheal deviation Patient does have deep cough intermittent Patient had chest x-ray back in May which looked normal She had pulmonary function test in the past which does show some small airway related findings    Assessment & Plan:  Diabetes subpar control We want to avoid low sugar spells Continue current medications as is Continue to do the readings We will plan on having the patient follow-up with [place] in approximately 6 weeks This patient cannot afford higher cost medicines  COPD on inhalers I find no evidence of pneumonia chronic cough related to COPD patient does not always use Symbicort twice daily she was encouraged to do so  Hold off on any type of additional x-rays at this point  Paget's disease will see specialist/surgeon every 6 months stable currently  Blood pressure patient concern her electronic cuff was  checked with our cuff her cuff reads approximately 10-15 points higher than what it should be Blood pressure today is good continue current measures  Thyroid nodule along with goiter- ideally would not want to do surgery unless absolutely necessary will be seen Dr. Dorris Fetch in the  coming weeks for further evaluation

## 2018-04-19 NOTE — Progress Notes (Signed)
Labs ordered and mailed to patient.  

## 2018-04-19 NOTE — Telephone Encounter (Signed)
Referral info sent to Dr. Liliane Channel office, they'll contact pt to schedule  Called & notified pt, pt verbalized understanding

## 2018-04-19 NOTE — Telephone Encounter (Signed)
Pt states dr nida's office never called her for an appt.

## 2018-04-20 ENCOUNTER — Encounter (HOSPITAL_BASED_OUTPATIENT_CLINIC_OR_DEPARTMENT_OTHER): Payer: PPO | Attending: Internal Medicine

## 2018-04-20 ENCOUNTER — Encounter (HOSPITAL_BASED_OUTPATIENT_CLINIC_OR_DEPARTMENT_OTHER): Payer: Self-pay

## 2018-04-20 DIAGNOSIS — S91301A Unspecified open wound, right foot, initial encounter: Secondary | ICD-10-CM | POA: Diagnosis not present

## 2018-04-20 DIAGNOSIS — L859 Epidermal thickening, unspecified: Secondary | ICD-10-CM | POA: Insufficient documentation

## 2018-04-20 DIAGNOSIS — E1142 Type 2 diabetes mellitus with diabetic polyneuropathy: Secondary | ICD-10-CM | POA: Diagnosis not present

## 2018-04-20 DIAGNOSIS — Z8631 Personal history of diabetic foot ulcer: Secondary | ICD-10-CM | POA: Insufficient documentation

## 2018-04-20 DIAGNOSIS — M79671 Pain in right foot: Secondary | ICD-10-CM | POA: Diagnosis not present

## 2018-05-06 DIAGNOSIS — H353211 Exudative age-related macular degeneration, right eye, with active choroidal neovascularization: Secondary | ICD-10-CM | POA: Diagnosis not present

## 2018-05-06 DIAGNOSIS — H35372 Puckering of macula, left eye: Secondary | ICD-10-CM | POA: Diagnosis not present

## 2018-05-06 DIAGNOSIS — H353124 Nonexudative age-related macular degeneration, left eye, advanced atrophic with subfoveal involvement: Secondary | ICD-10-CM | POA: Diagnosis not present

## 2018-05-06 DIAGNOSIS — H35033 Hypertensive retinopathy, bilateral: Secondary | ICD-10-CM | POA: Diagnosis not present

## 2018-05-10 DIAGNOSIS — K210 Gastro-esophageal reflux disease with esophagitis: Secondary | ICD-10-CM | POA: Diagnosis not present

## 2018-05-10 DIAGNOSIS — I10 Essential (primary) hypertension: Secondary | ICD-10-CM | POA: Diagnosis not present

## 2018-05-10 DIAGNOSIS — J449 Chronic obstructive pulmonary disease, unspecified: Secondary | ICD-10-CM | POA: Diagnosis not present

## 2018-05-10 DIAGNOSIS — R05 Cough: Secondary | ICD-10-CM | POA: Diagnosis not present

## 2018-05-17 ENCOUNTER — Other Ambulatory Visit: Payer: Self-pay | Admitting: Family Medicine

## 2018-06-11 DIAGNOSIS — E1122 Type 2 diabetes mellitus with diabetic chronic kidney disease: Secondary | ICD-10-CM | POA: Diagnosis not present

## 2018-06-11 DIAGNOSIS — N183 Chronic kidney disease, stage 3 (moderate): Secondary | ICD-10-CM | POA: Diagnosis not present

## 2018-06-11 DIAGNOSIS — I10 Essential (primary) hypertension: Secondary | ICD-10-CM | POA: Diagnosis not present

## 2018-06-11 DIAGNOSIS — E1165 Type 2 diabetes mellitus with hyperglycemia: Secondary | ICD-10-CM | POA: Diagnosis not present

## 2018-06-12 LAB — BASIC METABOLIC PANEL
BUN/Creatinine Ratio: 17 (ref 12–28)
BUN: 20 mg/dL (ref 8–27)
CALCIUM: 9.1 mg/dL (ref 8.7–10.3)
CHLORIDE: 102 mmol/L (ref 96–106)
CO2: 23 mmol/L (ref 20–29)
Creatinine, Ser: 1.21 mg/dL — ABNORMAL HIGH (ref 0.57–1.00)
GFR calc non Af Amer: 40 mL/min/{1.73_m2} — ABNORMAL LOW (ref >59)
GFR, EST AFRICAN AMERICAN: 46 mL/min/{1.73_m2} — AB (ref >59)
Glucose: 139 mg/dL — ABNORMAL HIGH (ref 65–99)
POTASSIUM: 4.7 mmol/L (ref 3.5–5.2)
SODIUM: 141 mmol/L (ref 134–144)

## 2018-06-12 LAB — HEMOGLOBIN A1C
Est. average glucose Bld gHb Est-mCnc: 197 mg/dL
Hgb A1c MFr Bld: 8.5 % — ABNORMAL HIGH (ref 4.8–5.6)

## 2018-06-17 ENCOUNTER — Ambulatory Visit (INDEPENDENT_AMBULATORY_CARE_PROVIDER_SITE_OTHER): Payer: PPO | Admitting: "Endocrinology

## 2018-06-17 ENCOUNTER — Encounter: Payer: Self-pay | Admitting: "Endocrinology

## 2018-06-17 VITALS — BP 137/84 | HR 66 | Ht 63.0 in | Wt 181.0 lb

## 2018-06-17 DIAGNOSIS — R05 Cough: Secondary | ICD-10-CM | POA: Diagnosis not present

## 2018-06-17 DIAGNOSIS — J449 Chronic obstructive pulmonary disease, unspecified: Secondary | ICD-10-CM | POA: Diagnosis not present

## 2018-06-17 DIAGNOSIS — I10 Essential (primary) hypertension: Secondary | ICD-10-CM | POA: Diagnosis not present

## 2018-06-17 DIAGNOSIS — E042 Nontoxic multinodular goiter: Secondary | ICD-10-CM | POA: Diagnosis not present

## 2018-06-17 DIAGNOSIS — E049 Nontoxic goiter, unspecified: Secondary | ICD-10-CM | POA: Diagnosis not present

## 2018-06-17 NOTE — Progress Notes (Signed)
Endocrinology Consult Note                                            06/17/2018, 4:13 PM   Subjective:    Patient ID: Victoria Vasquez, female    DOB: 1927/09/19, PCP Victoria Drown, MD   Past Medical History:  Diagnosis Date  . Arthritis   . Asthma   . Cancer ([place])    BREAST CANCER LEFT BREAST  . Chest pain    Evaluation prior to 2003 reportedly including cath-records never located; Echo in 2009: Mild LVH, LAE2, nl EF, moderate LAE, MR1-2  . Chronic kidney disease   . COPD (chronic obstructive pulmonary disease) (HCC)    Exertional dyspnea; PFTs in 2002: Moderate small airway obstruction, mildly reduced lung volumes and DLCO, nl ABG  . Diabetes mellitus, type II ([place])   . Dyspnea   . Family history of adverse reaction to anesthesia    daugher has postop N&V  . Fatigue   . GERD (gastroesophageal reflux disease)   . Hyperlipidemia   . Hypertension   . Legally blind 1 1 2015  . Macular degeneration of both eyes 1 1 2015  . Parotid mass 2003   Right-2003  . Scoliosis   . Shingles    Past Surgical History:  Procedure Laterality Date  . APPENDECTOMY  1951  . BREAST BIOPSY Left 10/05/2017   Procedure: BREAST BIOPSY;  Surgeon: Aviva Signs, MD;  Location: [facility];  Service: General;  Laterality: Left;  . BREAST LUMPECTOMY Left 03/18/2018  . BREAST LUMPECTOMY Left 03/18/2018   Procedure: LEFT BREAST CENTRAL  LUMPECTOMY;  Surgeon: Rolm Bookbinder, MD;  Location: [facility];  Service: General;  Laterality: Left;  . CARDIAC CATHETERIZATION  1999  . CHOLECYSTECTOMY  2003  . COLONOSCOPY  04/16/2006   [place]-Normal colon.  Exam limited due to retained particulate matter in the lumen of the colon, polyps less than 1 cm would have been easily missed.  Otherwise, no polyps, masses, inflammatory changes or vascular ectasia seen/ Normal retroflexed view of the rectum..  recommend repeat exam at 03/2011  . ORIF  1996  . ORIF ANKLE FRACTURE  1992   Social History   Socioeconomic  History  . Marital status: Widowed    Spouse name: Not on file  . Number of children: 3  . Years of education: Not on file  . Highest education level: Not on file  Occupational History  . Occupation: Retired  Scientific laboratory technician  . Financial resource strain: Not on file  . Food insecurity:    Worry: Not on file    Inability: Not on file  . Transportation needs:    Medical: Not on file    Non-medical: Not on file  Tobacco Use  . Smoking status: Passive Smoke Exposure - Never Smoker  . Smokeless tobacco: Never Used  Substance and Sexual Activity  . Alcohol use: No  . Drug use: No  . Sexual activity: Not on file  Lifestyle  . Physical activity:    Days per week: Not on file    Minutes per session: Not on file  . Stress: Not on file  Relationships  . Social connections:    Talks on phone: Not on file    Gets together: Not on file    Attends religious service: Not on file  Active member of club or organization: Not on file    Attends meetings of clubs or organizations: Not on file    Relationship status: Not on file  Other Topics Concern  . Not on file  Social History Narrative   Widowed   3 children   No regular exercise   Outpatient Encounter Medications as of 06/17/2018  Medication Sig  . acetaminophen (TYLENOL) 500 MG tablet Take 500 mg by mouth every 6 (six) hours as needed for moderate pain or headache.  . albuterol (PROVENTIL HFA;VENTOLIN HFA) 108 (90 Base) MCG/ACT inhaler Use 2 puffs every 4 hours as needed for shortness of breath/wheezing (Patient taking differently: Inhale 2 puffs into the lungs every 4 (four) hours as needed for wheezing or shortness of breath. )  . aspirin 81 MG tablet Take 81 mg by mouth daily.  . budesonide-formoterol (SYMBICORT) 160-4.5 MCG/ACT inhaler USE 2 PUFFS TWICE DAILY FOR ASTHMA. RINSE MOUTH AFTER USE. (Patient taking differently: Inhale 2 puffs into the lungs 2 (two) times daily. )  . fluticasone (FLONASE) 50 MCG/ACT nasal spray Place 2  sprays into both nostrils daily.  Marland Kitchen glipiZIDE (GLUCOTROL) 5 MG tablet Take 1 and a half tablets  in the a.m. And 1/2 in p.m. (Patient taking differently: Take 2.5-7.5 mg by mouth See admin instructions. Take 7.5 mg by mouth in the morning and take 2.5 mg by mouth in the evening)  . latanoprost (XALATAN) 0.005 % ophthalmic solution Place 1 drop into both eyes at bedtime.   Marland Kitchen losartan (COZAAR) 50 MG tablet Take 1 tablet (50 mg total) by mouth daily.  . Multiple Vitamins-Minerals (PRESERVISION AREDS 2+MULTI VIT PO) Take 1 capsule by mouth 2 (two) times daily.   . pantoprazole (PROTONIX) 40 MG tablet TAKE 1 TABLET BY MOUTH ONCE DAILY.  Marland Kitchen timolol (BETIMOL) 0.5 % ophthalmic solution Place 1 drop into both eyes daily.  . [DISCONTINUED] mupirocin ointment (BACTROBAN) 2 % Apply thin amount on affected area daily   No facility-administered encounter medications on file as of 06/17/2018.    ALLERGIES: Allergies  Allergen Reactions  . Lisinopril Cough  . Statins Other (See Comments)    Myalgias    VACCINATION STATUS: Immunization History  Administered Date(s) Administered  . Influenza Split 03/07/2013  . Influenza,inj,Quad PF,6+ Mos 03/27/2014, 03/27/2015, 01/24/2016, 02/12/2017, 02/16/2018  . Pneumococcal Conjugate-13 05/18/2014  . Pneumococcal Polysaccharide-23 01/24/2016    HPI Victoria Vasquez is 85 y.o. female who presents today with a medical history as above. she is being seen in consultation for multinodular goiter requested by Victoria Drown, MD.   -[name] is elderly,  accompanied by her grown daughter.  She denies any prior history of thyroid dysfunction.  She has noticed a nodule in her thyroid approximately 1 year ago.  Ultrasound on April 08, 2018 confirmed 4.6 cm nodule in the isthmus and 2.4 cm nodule in the left lobe of her thyroid.  Thyroid was also seen to be slightly enlarged overall.  Patient admits to some on and off swallowing difficulty for which she is being considered  for a swallowing study.  She denies any voice change, no shortness of breath.  She denies palpitations, tremors, nor heat/cold intolerance.  She denies any exposure to neck radiation, no family history of thyroid malignancy.  She is not on any antithyroid medications nor thyroid hormone supplements.   Review of Systems  Constitutional: no recent weight gain/loss, no fatigue, no subjective hyperthermia, no subjective hypothermia Eyes: no blurry vision, no xerophthalmia  ENT: no sore throat, no nodules palpated in throat, no dysphagia/odynophagia, no hoarseness Cardiovascular: no Chest Pain, no Shortness of Breath, no palpitations, no leg swelling Respiratory: no cough, no shortness of breath Gastrointestinal: no Nausea/Vomiting/Diarhhea, + on and off dysphagia Musculoskeletal: no muscle/joint aches Skin: no rashes Neurological: no tremors, no numbness, no tingling, no dizziness Psychiatric: no depression, no anxiety  Objective:    BP 137/84   Pulse 66   Ht 5\' 3"  (1.6 m)   Wt 181 lb (82.1 kg)   BMI 32.06 kg/m   Wt Readings from Last 3 Encounters:  06/17/18 181 lb (82.1 kg)  04/19/18 179 lb 3.2 oz (81.3 kg)  03/30/18 179 lb 0.2 oz (81.2 kg)    Physical Exam  Constitutional:  + Over weight for height, not in acute distress, normal state of mind Eyes: PERRLA, EOMI, no exophthalmos ENT: moist mucous membranes, + palpable nodular thyromegaly,  no gross cervical lymphadenopathy Cardiovascular: normal precordial activity, Regular Rate and Rhythm, no Murmur/Rubs/Gallops Respiratory:  adequate breathing efforts, no gross chest deformity, Clear to auscultation bilaterally Gastrointestinal: abdomen soft, Non -tender, No distension, Bowel Sounds present, no gross organomegaly Musculoskeletal: no gross deformities, strength intact in all four extremities Skin: moist, warm, no rashes Neurological: no tremor with outstretched hands, Deep tendon reflexes normal in bilateral lower  extremities.  CMP ( most recent) CMP     Component Value Date/Time   NA 141 06/11/2018 0921   K 4.7 06/11/2018 0921   CL 102 06/11/2018 0921   CO2 23 06/11/2018 0921   GLUCOSE 139 (H) 06/11/2018 0921   GLUCOSE 171 (H) 03/15/2018 0841   BUN 20 06/11/2018 0921   CREATININE 1.21 (H) 06/11/2018 0921   CREATININE 1.05 08/17/2013 0846   CALCIUM 9.1 06/11/2018 0921   PROT 7.1 09/25/2017 1018   PROT 7.0 07/29/2017 1036   PROT 5.7 01/22/2012 1316   ALBUMIN 4.3 09/25/2017 1018   ALBUMIN 4.6 07/29/2017 1036   AST 19 09/25/2017 1018   AST 14 01/22/2012 1316   ALT 12 (L) 09/25/2017 1018   ALKPHOS 66 09/25/2017 1018   ALKPHOS 48 01/22/2012 1316   BILITOT 0.7 09/25/2017 1018   BILITOT 0.7 07/29/2017 1036   BILITOT 0.4 01/22/2012 1316   GFRNONAA 40 (L) 06/11/2018 0921   GFRAA 46 (L) 06/11/2018 0921     Diabetic Labs (most recent): Lab Results  Component Value Date   HGBA1C 8.5 (H) 06/11/2018   HGBA1C 9.3 (H) 02/16/2018   HGBA1C 7.7 (H) 09/25/2017     Lipid Panel ( most recent) Lipid Panel     Component Value Date/Time   CHOL 233 (H) 07/29/2017 1036   TRIG 123 07/29/2017 1036   TRIG 177 09/04/2008   HDL 52 07/29/2017 1036   CHOLHDL 4.5 (H) 07/29/2017 1036   CHOLHDL 5.9 08/17/2013 0846   VLDL 42 (H) 08/17/2013 0846   LDLCALC 156 (H) 07/29/2017 1036   LDLCALC 100 09/04/2008      Lab Results  Component Value Date   TSH 1.320 03/30/2018   TSH 1.604 09/08/2012   TSH 1.145 01/01/2009   FREET4 1.16 03/30/2018      Assessment & Plan:   1. Multinodular goiter   - Victoria Vasquez  is being seen at a kind request of Luking, Elayne Snare, MD. - I have reviewed her available thyroid records and clinically evaluated the patient. - Based on these reviews, she has euthyroid multinodular goiter. -The next step is to obtain a fine-needle aspiration biopsy of at  least the larger/suspicious nodule in the isthmus.  Patient is elderly, wishes to know the nature of her thyroid and would  consider thyroidectomy if biopsy results are malignant. -I discussed and approached her for this procedure to be done in [facility] under ultrasound guidance. -Her recent thyroid function tests were within normal limits, no need for thyroid functional intervention at this time.  -she will return in 3 week to review her biopsy results. - I did not initiate any new prescriptions today. - I advised her  to maintain close follow up with Victoria Drown, MD for primary care needs.    Victoria Vasquez along with her grown daughter in exam room participated in the discussions, expressed understanding, and voiced agreement with the above plans.  All questions were answered to her satisfaction. she is encouraged to contact clinic should she have any questions or concerns prior to her return visit.  Follow up plan: Return in about 3 weeks (around 07/08/2018) for Follow up with Biopsy Results.   Glade Lloyd, MD [facility] [address] [place], [place] [address] Phone: [phone]  Fax: [phone]     06/17/2018, 4:13 PM  This note was partially dictated with voice recognition software. Similar sounding words can be transcribed inadequately or may not  be corrected upon review.

## 2018-06-21 ENCOUNTER — Ambulatory Visit (INDEPENDENT_AMBULATORY_CARE_PROVIDER_SITE_OTHER): Payer: PPO | Admitting: Family Medicine

## 2018-06-21 ENCOUNTER — Other Ambulatory Visit (HOSPITAL_COMMUNITY): Payer: Self-pay | Admitting: Specialist

## 2018-06-21 ENCOUNTER — Encounter: Payer: Self-pay | Admitting: Family Medicine

## 2018-06-21 VITALS — BP 134/84 | Ht 63.0 in | Wt 179.0 lb

## 2018-06-21 DIAGNOSIS — I10 Essential (primary) hypertension: Secondary | ICD-10-CM | POA: Diagnosis not present

## 2018-06-21 DIAGNOSIS — N183 Chronic kidney disease, stage 3 (moderate): Secondary | ICD-10-CM

## 2018-06-21 DIAGNOSIS — E1165 Type 2 diabetes mellitus with hyperglycemia: Secondary | ICD-10-CM

## 2018-06-21 DIAGNOSIS — R1319 Other dysphagia: Secondary | ICD-10-CM

## 2018-06-21 DIAGNOSIS — E785 Hyperlipidemia, unspecified: Secondary | ICD-10-CM

## 2018-06-21 DIAGNOSIS — E041 Nontoxic single thyroid nodule: Secondary | ICD-10-CM

## 2018-06-21 DIAGNOSIS — E1122 Type 2 diabetes mellitus with diabetic chronic kidney disease: Secondary | ICD-10-CM | POA: Diagnosis not present

## 2018-06-21 DIAGNOSIS — IMO0002 Reserved for concepts with insufficient information to code with codable children: Secondary | ICD-10-CM

## 2018-06-21 MED ORDER — ZOSTER VAC RECOMB ADJUVANTED 50 MCG/0.5ML IM SUSR: 0.5000 mL | Freq: Once | INTRAMUSCULAR | 1 refills | Status: AC

## 2018-06-21 NOTE — Patient Instructions (Signed)

## 2018-06-21 NOTE — Progress Notes (Signed)
   Subjective:    Patient ID: Victoria Vasquez, female    DOB: [birth year], 85 y.o.   MRN: [id number]  HPI Patient is here today for a two month follow up.  Patient was here 04/20/2019 to follow up on her chronic health issues that were discussed at that time.   History of hypertension at last visit she was concerned her bp machine was 10/-15 points higher that what it should have been. Blood pressure at the office visit was good. She takes Losartan 50 mg once per day.   Patient does have diabetes subpar control patient cannot afford the newer medications currently.  Also trying to avoid low sugar spells.  Results for orders placed or performed in visit on 07/62/26  Basic Metabolic Panel (BMET)  Result Value Ref Range   Glucose 139 (H) 65 - 99 mg/dL   BUN 20 8 - 27 mg/dL   Creatinine, Ser 1.21 (H) 0.57 - 1.00 mg/dL   GFR calc non Af Amer 40 (L) >59 mL/min/1.73   GFR calc Af Amer 46 (L) >59 mL/min/1.73   BUN/Creatinine Ratio 17 12 - 28   Sodium 141 134 - 144 mmol/L   Potassium 4.7 3.5 - 5.2 mmol/L   Chloride 102 96 - 106 mmol/L   CO2 23 20 - 29 mmol/L   Calcium 9.1 8.7 - 10.3 mg/dL  Hemoglobin A1c  Result Value Ref Range   Hgb A1c MFr Bld 8.5 (H) 4.8 - 5.6 %   Est. average glucose Bld gHb Est-mCnc 197 mg/dL    Review of Systems  Constitutional: Negative for activity change, appetite change and fatigue.  HENT: Negative for congestion and rhinorrhea.   Respiratory: Negative for cough and shortness of breath.   Cardiovascular: Negative for chest pain and leg swelling.  Gastrointestinal: Negative for abdominal pain and constipation.  Skin: Negative for color change.  Neurological: Negative for headaches.  Psychiatric/Behavioral: Negative for behavioral problems.       Objective:   Physical Exam Vitals signs reviewed.  Constitutional:      General: She is not in acute distress. HENT:     Head: Normocephalic.  Cardiovascular:     Rate and Rhythm: Normal rate and regular  rhythm.     Heart sounds: Normal heart sounds. No murmur.  Pulmonary:     Effort: Pulmonary effort is normal.     Breath sounds: Normal breath sounds.  Lymphadenopathy:     Cervical: No cervical adenopathy.  Neurological:     Mental Status: She is alert.  Psychiatric:        Behavior: Behavior normal.           Assessment & Plan:  Subpar diabetes subpar kidney control/chronic kidney disease continue current medication.  A1c not under good control but it is too concerning for the possibility of low sugars to be too aggressive with the glipizide patient cannot afford higher tach medicines  Thyroid nodule they will be doing a fine-needle biopsy in the near future with endocrinology hopefully things are going well  Lab work to be completed before next visit follow-up in approximately 4 months

## 2018-06-23 ENCOUNTER — Other Ambulatory Visit: Payer: Self-pay

## 2018-06-23 ENCOUNTER — Ambulatory Visit (HOSPITAL_COMMUNITY): Payer: PPO | Attending: Pulmonary Disease | Admitting: Speech Pathology

## 2018-06-23 ENCOUNTER — Encounter (HOSPITAL_COMMUNITY): Payer: Self-pay | Admitting: Speech Pathology

## 2018-06-23 ENCOUNTER — Ambulatory Visit (HOSPITAL_COMMUNITY)
Admission: RE | Admit: 2018-06-23 | Discharge: 2018-06-23 | Disposition: A | Discharge status: Home / Self Care | Payer: PPO | Source: Ambulatory Visit | Attending: Pulmonary Disease | Admitting: Pulmonary Disease

## 2018-06-23 DIAGNOSIS — R1319 Other dysphagia: Secondary | ICD-10-CM | POA: Diagnosis not present

## 2018-06-23 DIAGNOSIS — R05 Cough: Secondary | ICD-10-CM | POA: Diagnosis not present

## 2018-06-23 DIAGNOSIS — R1312 Dysphagia, oropharyngeal phase: Secondary | ICD-10-CM | POA: Insufficient documentation

## 2018-06-23 NOTE — Therapy (Signed)
[facility] [place], [place], [address] Phone: [phone]   Fax:  [phone]  Modified Barium Swallow  Patient Details  Name: Victoria Vasquez MRN: [id number] Date of Birth: 1928-02-05 No data recorded  Encounter Date: 06/23/2018  End of Session - 06/23/18 2242    Visit Number  1    Number of Visits  1    Authorization Type  [employer]    SLP Start Time  3151    SLP Stop Time   1422    SLP Time Calculation (min)  27 min    Activity Tolerance  Patient tolerated treatment well       Past Medical History:  Diagnosis Date  . Arthritis   . Asthma   . Cancer ([place])    BREAST CANCER LEFT BREAST  . Chest pain    Evaluation prior to 2003 reportedly including cath-records never located; Echo in 2009: Mild LVH, LAE2, nl EF, moderate LAE, MR1-2  . Chronic kidney disease   . COPD (chronic obstructive pulmonary disease) (HCC)    Exertional dyspnea; PFTs in 2002: Moderate small airway obstruction, mildly reduced lung volumes and DLCO, nl ABG  . Diabetes mellitus, type II ([place])   . Dyspnea   . Family history of adverse reaction to anesthesia    daugher has postop N&V  . Fatigue   . GERD (gastroesophageal reflux disease)   . Hyperlipidemia   . Hypertension   . Legally blind 1 1 2015  . Macular degeneration of both eyes 1 1 2015  . Parotid mass 2003   Right-2003  . Scoliosis   . Shingles     Past Surgical History:  Procedure Laterality Date  . APPENDECTOMY  1951  . BREAST BIOPSY Left 10/05/2017   Procedure: BREAST BIOPSY;  Surgeon: Aviva Signs, MD;  Location: [facility];  Service: General;  Laterality: Left;  . BREAST LUMPECTOMY Left 03/18/2018  . BREAST LUMPECTOMY Left 03/18/2018   Procedure: LEFT BREAST CENTRAL  LUMPECTOMY;  Surgeon: Rolm Bookbinder, MD;  Location: [facility];  Service: General;  Laterality: Left;  . CARDIAC CATHETERIZATION  1999  . CHOLECYSTECTOMY  2003  . COLONOSCOPY  04/16/2006   [place]-Normal  colon.  Exam limited due to retained particulate matter in the lumen of the colon, polyps less than 1 cm would have been easily missed.  Otherwise, no polyps, masses, inflammatory changes or vascular ectasia seen/ Normal retroflexed view of the rectum..  recommend repeat exam at 03/2011  . ORIF  1996  . ORIF ANKLE FRACTURE  1992    There were no vitals filed for this visit.  Subjective Assessment - 06/23/18 2234    Subjective  "It feels like I have a knot in my throat."    Patient is accompained by:  Family member    Special Tests  MBSS    Currently in Pain?  No/denies          General - 06/23/18 2234      General Information   Date of Onset  06/17/18    HPI  Victoria Vasquez is an 85 yo female who was referred by Dr. Sinda Du for MBSS due to Pt with chronic cough in setting of COPD. Pt reports that she has had a cough for ~1 year, takes a PPI in the mornings, and has nodules on her thyroid. She reports a remote history of esophageal dilation due to difficulty swallowing meats.    Type of Study  MBS-Modified  Barium Swallow Study    Previous Swallow Assessment  None    Diet Prior to this Study  Regular;Thin liquids    Temperature Spikes Noted  No    Respiratory Status  Room air    History of Recent Intubation  No    Behavior/Cognition  Alert;Cooperative;Pleasant mood    Oral Cavity Assessment  Within Functional Limits    Oral Care Completed by SLP  No    Oral Cavity - Dentition  Adequate natural dentition    Vision  Functional for self feeding    Self-Feeding Abilities  Able to feed self    Patient Positioning  Upright in chair    Baseline Vocal Quality  Normal    Volitional Cough  Strong    Volitional Swallow  Able to elicit    Anatomy  Within functional limits    Pharyngeal Secretions  Not observed secondary MBS         Oral Preparation/Oral Phase - 06/23/18 2238      Oral Preparation/Oral Phase   Oral Phase  Within functional limits      Electrical stimulation -  Oral Phase   Was Electrical Stimulation Used  No       Pharyngeal Phase - 06/23/18 2238      Pharyngeal Phase   Pharyngeal Phase  Impaired      Pharyngeal - Thin   Pharyngeal- Thin Teaspoon  Within functional limits;Swallow initiation at vallecula    Pharyngeal- Thin Cup  Swallow initiation at vallecula;Swallow initiation at pyriform sinus    Pharyngeal- Thin Straw  Swallow initiation at vallecula;Swallow initiation at pyriform sinus      Pharyngeal - Solids   Pharyngeal- Puree  Swallow initiation at vallecula;Pharyngeal residue - valleculae   min BOT residuals x1 which cleared with repeat swallow   Pharyngeal- Regular  Within functional limits    Pharyngeal- Pill  Within functional limits      Electrical Stimulation - Pharyngeal Phase   Was Electrical Stimulation Used  No       Cricopharyngeal Phase - 06/23/18 2239      Cervical Esophageal Phase   Cervical Esophageal Phase  Impaired      Cervical Esophageal Phase - Thin   Thin Cup  Prominent cricopharyngeal segment      Cervical Esophageal Phase - Comment   Other Esophageal Phase Observations  Standing column of barium in thoracic esophagus with some retrograde movement noted; radiologist reported dysmotility        Plan - 06/23/18 2243    Clinical Impression Statement  Pt presents with normal oropharyngeal swallow and suspected primary esophageal dysphagia. Swallow initiation was timely for age with swallow trigger after reaching valleculae and spilling towards pyriforms. Hyolaryngeal excursion, tongue base retraction, epiglottic deflection, and airway closure was WNL with no resulting penetration, aspiration, or significant residuals post swallow. Esophageal sweep revealed a standing column of barium with some retrograde movement from thoracic esophagus to the cervical esophagus. The barium tablet was transiently delayed in the distal esophagus, but did eventually clear following a bite of puree and liquid wash. The  radiologist present identified likely age related dysmotility. Recommend regular textures with careful mastication/chopping of meats and breads and thin liquids. Esophageal dysmotility precautions were reviewed with Pt and she was advised to make sure she is upright for all meals, remain upright for at least 30 minutes after, and alternate solids and liquids to help move solids through the esophagus. Pt noted to have a prominent cricopharyngeus which is often  seen in individuals with esophageal dysphagia. No further SLP services indicated at this time.    Consulted and Agree with Plan of Care  Patient;Family member/caregiver       Patient will benefit from skilled therapeutic intervention in order to improve the following deficits and impairments:   Dysphagia, oropharyngeal phase    Recommendations/Treatment - 06/23/18 2241      Swallow Evaluation Recommendations   SLP Diet Recommendations  Age appropriate regular;Thin    Liquid Administration via  Cup;Straw    Medication Administration  Whole meds with liquid    Supervision  Patient able to self feed    Compensations  Follow solids with liquid    Postural Changes  Seated upright at 90 degrees;Remain upright for at least 30 minutes after feeds/meals       Prognosis - 06/23/18 2241      Prognosis   Prognosis for Safe Diet Advancement  Good      Individuals Consulted   Consulted and Agree with Results and Recommendations  Patient;Family member/caregiver    Report Sent to   Referring physician       Problem List Patient Active Problem List   Diagnosis Date Noted  . Multinodular goiter 06/17/2018  . Paget's disease and intraductal carcinoma of breast, left ([place]) 03/18/2018  . Paget's carcinoma of the nipple, left ([place])   . Primary osteoarthritis of right knee 05/29/2016  . Glaucoma 07/05/2015  . Diabetic nephropathy with proteinuria ([place]) 09/04/2014  . Macular degeneration 08/30/2014  . Cough 05/01/2014  . Somnolence  10/12/2012  . Hypokalemia 09/29/2012  . Syncope 09/14/2012  . Dyspnea on exertion 09/08/2012  . Uncontrolled type 2 diabetes mellitus with stage 3 chronic kidney disease ([place])   . Hypertension   . Hyperlipidemia   . Chest pain   . GERD (gastroesophageal reflux disease) 02/11/2012   Thank you,  Genene Churn, [place]  [facility] 06/23/2018, 10:44 PM  [facility] [address] [place], [place], [address] Phone: [phone]   Fax:  [phone]  Name: KAMRI GOTSCH MRN: [id number] Date of Birth: [birth year]

## 2018-07-01 ENCOUNTER — Telehealth: Payer: Self-pay | Admitting: Family Medicine

## 2018-07-01 ENCOUNTER — Ambulatory Visit (HOSPITAL_COMMUNITY)
Admission: RE | Admit: 2018-07-01 | Discharge: 2018-07-01 | Disposition: A | Discharge status: Home / Self Care | Payer: PPO | Source: Ambulatory Visit | Attending: "Endocrinology | Admitting: "Endocrinology

## 2018-07-01 DIAGNOSIS — E042 Nontoxic multinodular goiter: Secondary | ICD-10-CM

## 2018-07-01 DIAGNOSIS — C73 Malignant neoplasm of thyroid gland: Secondary | ICD-10-CM | POA: Insufficient documentation

## 2018-07-01 DIAGNOSIS — E041 Nontoxic single thyroid nodule: Secondary | ICD-10-CM | POA: Diagnosis not present

## 2018-07-01 MED ORDER — LIDOCAINE HCL (PF) 2 % IJ SOLN
INTRAMUSCULAR | Status: AC
  Administered 2018-07-01: 10:00:00
  Filled 2018-07-01: qty 10

## 2018-07-01 NOTE — Telephone Encounter (Signed)
Pt would like to let Dr. Nicki Reaper know she had her biopsy done this morning.

## 2018-07-02 NOTE — Telephone Encounter (Signed)
Thyroid biopsy shows papillary carcinoma Will need to get her in with ENT specialist

## 2018-07-04 NOTE — Telephone Encounter (Signed)
Please let me speak with the patient regarding the results of her biopsy

## 2018-07-05 NOTE — Telephone Encounter (Signed)
Tried to call no answer

## 2018-07-05 NOTE — Telephone Encounter (Signed)
Pt on phone for dr scott to speak with

## 2018-07-08 ENCOUNTER — Telehealth: Payer: Self-pay | Admitting: Family Medicine

## 2018-07-08 ENCOUNTER — Other Ambulatory Visit: Payer: Self-pay

## 2018-07-08 ENCOUNTER — Ambulatory Visit (INDEPENDENT_AMBULATORY_CARE_PROVIDER_SITE_OTHER): Payer: PPO | Admitting: "Endocrinology

## 2018-07-08 ENCOUNTER — Encounter: Payer: Self-pay | Admitting: "Endocrinology

## 2018-07-08 VITALS — BP 131/80 | HR 93 | Ht 63.0 in | Wt 186.0 lb

## 2018-07-08 DIAGNOSIS — C73 Malignant neoplasm of thyroid gland: Secondary | ICD-10-CM

## 2018-07-08 NOTE — Telephone Encounter (Signed)
Patient has been referred to [facility] for thyroidectomy We will help facilitate this by calling their practice Referral has already been made into the system The patient is aware of the referral

## 2018-07-08 NOTE — Progress Notes (Signed)
Endocrinology follow-up Note                                            07/08/2018, 12:52 PM   Subjective:    Patient ID: Victoria Vasquez, female    DOB: 08-13-1927, PCP Kathyrn Drown, MD   Past Medical History:  Diagnosis Date  . Arthritis   . Asthma   . Cancer ([place])    BREAST CANCER LEFT BREAST  . Chest pain    Evaluation prior to 2003 reportedly including cath-records never located; Echo in 2009: Mild LVH, LAE2, nl EF, moderate LAE, MR1-2  . Chronic kidney disease   . COPD (chronic obstructive pulmonary disease) (H[facility])    Exertional dyspnea; PFTs in 2002: Moderate small airway obstruction, mildly reduced lung volumes and DLCO, nl ABG  . Diabetes mellitus, type II ([place])   . Dyspnea   . Family history of adverse reaction to anesthesia    daugher has postop N&V  . Fatigue   . GERD (gastroesophageal reflux disease)   . Hyperlipidemia   . Hypertension   . Legally blind 1 1 2015  . Macular degeneration of both eyes 1 1 2015  . Parotid mass 2003   Right-2003  . Scoliosis   . Shingles    Past Surgical History:  Procedure Laterality Date  . APPENDECTOMY  1951  . BREAST BIOPSY Left 10/05/2017   Procedure: BREAST BIOPSY;  Surgeon: Aviva Signs, MD;  Location: [facility];  Service: General;  Laterality: Left;  . BREAST LUMPECTOMY Left 03/18/2018  . BREAST LUMPECTOMY Left 03/18/2018   Procedure: LEFT BREAST CENTRAL  LUMPECTOMY;  Surgeon: Rolm Bookbinder, MD;  Location: [facility];  Service: General;  Laterality: Left;  . CARDIAC CATHETERIZATION  1999  . CHOLECYSTECTOMY  2003  . COLONOSCOPY  04/16/2006   [place]-Normal colon.  Exam limited due to retained particulate matter in the lumen of the colon, polyps less than 1 cm would have been easily missed.  Otherwise, no polyps, masses, inflammatory changes or vascular ectasia seen/ Normal retroflexed view of the rectum..  recommend repeat exam at 03/2011  . ORIF  1996  . ORIF ANKLE FRACTURE  1992   Social History    Socioeconomic History  . Marital status: Widowed    Spouse name: Not on file  . Number of children: 3  . Years of education: Not on file  . Highest education level: Not on file  Occupational History  . Occupation: Retired  Scientific laboratory technician  . Financial resource strain: Not on file  . Food insecurity:    Worry: Not on file    Inability: Not on file  . Transportation needs:    Medical: Not on file    Non-medical: Not on file  Tobacco Use  . Smoking status: Passive Smoke Exposure - Never Smoker  . Smokeless tobacco: Never Used  Substance and Sexual Activity  . Alcohol use: No  . Drug use: No  . Sexual activity: Not on file  Lifestyle  . Physical activity:    Days per week: Not on file    Minutes per session: Not on file  . Stress: Not on file  Relationships  . Social connections:    Talks on phone: Not on file    Gets together: Not on file    Attends religious service: Not on file  Active member of club or organization: Not on file    Attends meetings of clubs or organizations: Not on file    Relationship status: Not on file  Other Topics Concern  . Not on file  Social History Narrative   Widowed   3 children   No regular exercise   Outpatient Encounter Medications as of 07/08/2018  Medication Sig  . acetaminophen (TYLENOL) 500 MG tablet Take 500 mg by mouth every 6 (six) hours as needed for moderate pain or headache.  . albuterol (PROVENTIL HFA;VENTOLIN HFA) 108 (90 Base) MCG/ACT inhaler Use 2 puffs every 4 hours as needed for shortness of breath/wheezing (Patient taking differently: Inhale 2 puffs into the lungs every 4 (four) hours as needed for wheezing or shortness of breath. )  . aspirin 81 MG tablet Take 81 mg by mouth daily.  . budesonide-formoterol (SYMBICORT) 160-4.5 MCG/ACT inhaler USE 2 PUFFS TWICE DAILY FOR ASTHMA. RINSE MOUTH AFTER USE. (Patient taking differently: Inhale 2 puffs into the lungs 2 (two) times daily. )  . fluticasone (FLONASE) 50 MCG/ACT  nasal spray Place 2 sprays into both nostrils daily.  Marland Kitchen glipiZIDE (GLUCOTROL) 5 MG tablet Take 1 and a half tablets  in the a.m. And 1/2 in p.m. (Patient taking differently: Take 2.5-7.5 mg by mouth See admin instructions. Take 7.5 mg by mouth in the morning and take 2.5 mg by mouth in the evening)  . latanoprost (XALATAN) 0.005 % ophthalmic solution Place 1 drop into both eyes at bedtime.   Marland Kitchen losartan (COZAAR) 50 MG tablet Take 1 tablet (50 mg total) by mouth daily.  . Multiple Vitamins-Minerals (PRESERVISION AREDS 2+MULTI VIT PO) Take 1 capsule by mouth 2 (two) times daily.   . pantoprazole (PROTONIX) 40 MG tablet TAKE 1 TABLET BY MOUTH ONCE DAILY.  Marland Kitchen timolol (BETIMOL) 0.5 % ophthalmic solution Place 1 drop into both eyes daily.   No facility-administered encounter medications on file as of 07/08/2018.    ALLERGIES: Allergies  Allergen Reactions  . Lisinopril Cough  . Statins Other (See Comments)    Myalgias    VACCINATION STATUS: Immunization History  Administered Date(s) Administered  . Influenza Split 03/07/2013  . Influenza,inj,Quad PF,6+ Mos 03/27/2014, 03/27/2015, 01/24/2016, 02/12/2017, 02/16/2018  . Pneumococcal Conjugate-13 05/18/2014  . Pneumococcal Polysaccharide-23 01/24/2016  . Zoster Recombinat (Shingrix) 06/23/2018    HPI Victoria Vasquez is 85 y.o. female who presents today with a medical history as above. she is being seen in follow-up after she was seen consultation for multinodular goiter requested by Kathyrn Drown, MD.   -Victoria Vasquez is elderly,  accompanied by her grown daughter.  During her last visit, she was sent for fine-needle aspiration of 4.6 cm nodule in her thyroid isthmus.  This biopsy revealed papillary thyroid cancer.  Patient is considering surgery, with [facility] in [place] given her previous surgeries were without growth.    Patient admits to some on and off swallowing difficulty for which she is being considered for a swallowing  study.  She denies any voice change, no shortness of breath.  She denies palpitations, tremors, nor heat/cold intolerance.  She denies any exposure to neck radiation, no family history of thyroid malignancy.  She is not on any antithyroid medications nor thyroid hormone supplements.   Review of Systems  Constitutional: + weight gain, no fatigue, no subjective hyperthermia, no subjective hypothermia Eyes: no blurry vision, no xerophthalmia ENT: no sore throat, no nodules palpated in throat, + dysphagia/odynophagia, no hoarseness Cardiovascular: no  Chest Pain, no Shortness of Breath, no palpitations, no leg swelling Respiratory: no cough, no shortness of breath Gastrointestinal: no Nausea/Vomiting/Diarhhea, + on and off dysphagia Musculoskeletal: no muscle/joint aches Skin: no rashes Neurological: no tremors, no numbness, no tingling, no dizziness Psychiatric: no depression, no anxiety  Objective:    BP 131/80   Pulse 93   Ht 5\' 3"  (1.6 m)   Wt 186 lb (84.4 kg)   BMI 32.95 kg/m   Wt Readings from Last 3 Encounters:  07/08/18 186 lb (84.4 kg)  06/21/18 179 lb 0.6 oz (81.2 kg)  06/17/18 181 lb (82.1 kg)    Physical Exam  Constitutional:  + Obese for height, not in acute distress, normal state of mind Eyes: PERRLA, EOMI, no exophthalmos ENT: moist mucous membranes, + palpable nodular thyromegaly,  no gross cervical lymphadenopathy Cardiovascular: normal precordial activity, Regular Rate and Rhythm, no Murmur/Rubs/Gallops Musculoskeletal: no gross deformities, strength intact in all four extremities Skin: moist, warm, no rashes Neurological: no tremor with outstretched hands, Deep tendon reflexes normal in bilateral lower extremities.  CMP ( most recent) CMP     Component Value Date/Time   NA 141 06/11/2018 0921   K 4.7 06/11/2018 0921   CL 102 06/11/2018 0921   CO2 23 06/11/2018 0921   GLUCOSE 139 (H) 06/11/2018 0921   GLUCOSE 171 (H) 03/15/2018 0841   BUN 20 06/11/2018  0921   CREATININE 1.21 (H) 06/11/2018 0921   CREATININE 1.05 08/17/2013 0846   CALCIUM 9.1 06/11/2018 0921   PROT 7.1 09/25/2017 1018   PROT 7.0 07/29/2017 1036   PROT 5.7 01/22/2012 1316   ALBUMIN 4.3 09/25/2017 1018   ALBUMIN 4.6 07/29/2017 1036   AST 19 09/25/2017 1018   AST 14 01/22/2012 1316   ALT 12 (L) 09/25/2017 1018   ALKPHOS 66 09/25/2017 1018   ALKPHOS 48 01/22/2012 1316   BILITOT 0.7 09/25/2017 1018   BILITOT 0.7 07/29/2017 1036   BILITOT 0.4 01/22/2012 1316   GFRNONAA 40 (L) 06/11/2018 0921   GFRAA 46 (L) 06/11/2018 0921     Diabetic Labs (most recent): Lab Results  Component Value Date   HGBA1C 8.5 (H) 06/11/2018   HGBA1C 9.3 (H) 02/16/2018   HGBA1C 7.7 (H) 09/25/2017     Lipid Panel ( most recent) Lipid Panel     Component Value Date/Time   CHOL 233 (H) 07/29/2017 1036   TRIG 123 07/29/2017 1036   TRIG 177 09/04/2008   HDL 52 07/29/2017 1036   CHOLHDL 4.5 (H) 07/29/2017 1036   CHOLHDL 5.9 08/17/2013 0846   VLDL 42 (H) 08/17/2013 0846   LDLCALC 156 (H) 07/29/2017 1036   LDLCALC 100 09/04/2008      Lab Results  Component Value Date   TSH 1.320 03/30/2018   TSH 1.604 09/08/2012   TSH 1.145 01/01/2009   FREET4 1.16 03/30/2018      Assessment & Plan:   1. Multinodular goiter -Patient returns to discuss her fine-needle aspiration biopsy findings. -4.6 cm thyroid isthmus nodule biopsy revealed papillary thyroid cancer.  I discussed the various types of thyroid cancer, approach of treatment and she agrees with plan.    She has history of nodular goiter with on and off dysphagia, odynophagia.  Patient is elderly, however, she will consider surgery.  Her grown daughter is offering to help. -She did have a good experience with central [facility] surgery in [place] during her prior surgeries and would like to consider that group for thyroid surgery. -I discussed and initiated  referral for total thyroidectomy. -She understands the subsequent need for  thyroid hormone replacement after her surgery.  She will return in 5 weeks with thyroid function tests for reevaluation.  - I did not initiate any new prescriptions today. - I advised her  to maintain close follow up with Kathyrn Drown, MD for primary care needs.    Victoria Vasquez and her daughter participated in the discussions, expressed understanding, and voiced agreement with the above plans.  All questions were answered to her satisfaction. she is encouraged to contact clinic should she have any questions or concerns prior to her return visit.   Follow up plan: Return in about 5 weeks (around 08/12/2018) for Follow up with Labs after Surgery.   Glade Lloyd, MD [facility] [address] [place], [place] [address] Phone: [phone]  Fax: [phone]     07/08/2018, 12:52 PM  This note was partially dictated with voice recognition software. Similar sounding words can be transcribed inadequately or may not  be corrected upon review.

## 2018-07-09 ENCOUNTER — Telehealth: Payer: Self-pay

## 2018-07-09 NOTE — Telephone Encounter (Signed)
The referral was placed by Dr.Nida office and they are working on that. I did speak with patient regarding the referral to [facility]. Pt informed that if we can be of any help to let us know. Pt verbalized understanding.

## 2018-07-09 NOTE — Telephone Encounter (Signed)
I discussed the case with the patient she is aware of the cancer she will be following up with Dr. Dorris Fetch

## 2018-07-09 NOTE — Telephone Encounter (Signed)
Pts daughter states that when Victoria Vasquez was here she forgot to get Dr Dorris Fetch to look at her neck where she had the biopsy. She said its bruised and swollen to about the size of an egg in that area. Please advise.

## 2018-07-09 NOTE — Telephone Encounter (Signed)
Spoke with her to put ice pack, mark it with a ink. Advised to go to ER if it is expanding and causing dysphagia, shortness of breath.

## 2018-07-09 NOTE — Telephone Encounter (Signed)
Nurses Please talk with Victoria Vasquez Let her know that I am aware that Dr. Dorris Fetch has had a good discussion with her I am also aware that they have referred her to [facility] for her thyroid cancer Let her know that I agree with this and if we can be of help with anything else to please let us know.

## 2018-07-14 NOTE — Telephone Encounter (Signed)
No referral ordered for [facility]- Please advise

## 2018-07-14 NOTE — Telephone Encounter (Signed)
Nurses Please talk with the patient Trying to clarify with the patient which specialist says she has not heard from On last communication she was being set up with [facility] which was going to be seeing her for her thyroid cancer-was it back specialist that she has not heard from?  (She does have renal insufficiency but her kidney disease is not severe so that is why I am doubting that it is [facility] at this point)  Thanks for looking into this

## 2018-07-14 NOTE — Telephone Encounter (Signed)
Patient states it is [facility] that has not contacted her for an appointment. Note sent to [facility] please see what you can figure out Thanks.

## 2018-07-14 NOTE — Telephone Encounter (Signed)
Patient would like to inform Dr.Scott that she has not heard [facility] yet.

## 2018-07-15 NOTE — Telephone Encounter (Signed)
Nurses Please call [facility] This patient has thyroid cancer and will need a thyroidectomy Dr. Dorris Fetch sent a referral The patient has not heard anything from [facility] for surgery at this point She is already an established patient there with Dr. Donne Hazel Please asked [facility] if they could look into this referral and call the patient with an appointment The patient is understandably anxious Given her age it would be very nice if this could be handled relatively soon so she does not get too worried about this After speaking with [facility] and getting there assurance they will call the patient on this Then please call the patient to give her an update and hopefully she will hear from them within the next few days  If any trouble let me know

## 2018-07-19 NOTE — Telephone Encounter (Signed)
Telephone call to [facility] x 2 -both times sent directly to a voicemail box but it states messages can not be left at this time

## 2018-07-21 ENCOUNTER — Ambulatory Visit: Payer: Self-pay | Admitting: Surgery

## 2018-07-21 DIAGNOSIS — E042 Nontoxic multinodular goiter: Secondary | ICD-10-CM | POA: Diagnosis not present

## 2018-07-21 DIAGNOSIS — E041 Nontoxic single thyroid nodule: Secondary | ICD-10-CM | POA: Diagnosis not present

## 2018-07-21 DIAGNOSIS — C540 Malignant neoplasm of isthmus uteri: Secondary | ICD-10-CM | POA: Diagnosis not present

## 2018-07-21 DIAGNOSIS — R0989 Other specified symptoms and signs involving the circulatory and respiratory systems: Secondary | ICD-10-CM | POA: Diagnosis not present

## 2018-07-21 NOTE — Telephone Encounter (Signed)
[facility] they will be doing surgery in the future

## 2018-07-21 NOTE — H&P (Signed)
Victoria Vasquez Documented: 07/21/2018 11:20 AM Location: [facility] Patient #: [id number] DOB: 12-17-1927 Widowed / Language: Cleophus Molt / Race: White Female  History of Present Illness Adin Hector MD; 07/21/2018 12:10 PM) The patient is a 85 year old female who presents with thyroid cancer. Note for "Thyroid cancer": ` ` ` Patient sent for surgical consultation at the request of Dr Dorris Fetch  Chief Complaint: Papillary thyroid cancer ` ` The patient is a pleasant elderly woman with multinodular goiter. She is having worsening symptoms of globus with feeling fullness with swallowing and not able to lie supine. Ultrasound revealed a 4 cm isthmus mass and 2 cm left thyroid lobe mass. Biopsy of this with mesh as papillary cancer. Surgical consultation requested. Patient comes in today with her daughter. Patient had a modified barium swallow. No frank aspiration been swallowing especially 2 pills was somewhat slowed and delayed with some esophageal dysmotility. Patient evaluated by endocrinology Dr. Lum Babe. Felt she would benefit from surgery. Most likely follow-up to start thyroid hormone replacement postoperatively. Patient denies any hoarseness. She tends to feel a little bit tired. Daughter says she said that for 20 years. She is a diabetic on oral hypoglycemics only. She does not smoke. She moves her bowels every day. She's had have breast surgery the past few years. No major bleeding issues. No problems with anesthesia.  (Review of systems as stated in this history (HPI) or in the review of systems. Otherwise all other 12 point ROS are negative) ` ` `  Adequacy Reason Satisfactory For Evaluation. Diagnosis THYROID, FINE NEEDLE ASPIRATION, ISTHMUS (SPECIMEN 1 OF 1 COLLECTED 07/01/18): - FINDINGS CONSISTENT WITH PAPILLARY CARCINOMA (BETHESDA CATEGORY VI) DAWN BUTLER MD Pathologist, Electronic Signature (Case signed 07/02/2018) Source Thyroid, Fine Needle  Aspiration, Isthmus (specimen 1 of 1 collected 07/01/18) Emelie Newsom Specimen: Received is/are 3 slides in 95% Ethyl alcohol, 3 slides for diff stain, and 30 ccs of light pink Cytolyt solution. (CM:cm) Prepared: # Smears: 6 # Concentration Technique Slides (i.e. ThinPrep): 1 # Cell Block: Cell block attempted, not obtained. Additional Studies: n/a Comment Dr. Tresa Moore reviewed the case and agrees with the above diagnosis. Dr. Wolfgang Phoenix was notified of these results on July 02, 2018. Report signed out from the following location(s) Technical component and interpretation was performed at [facility] [place], [place], [place] [address]. CLIA #: [id number], 1 of   Allergies Emeline Gins, [place]; 07/21/2018 11:21 AM) No Known Drug Allergies [03/09/2018]: Allergies Reconciled  Medication History Emeline Gins, CMA; 07/21/2018 11:22 AM) Losartan Potassium (50MG  Tablet, Oral) Active. Latanoprost (0.005% Solution, Ophthalmic) Active. Pantoprazole Sodium (40MG  Tablet DR, Oral) Active. Symbicort (160-4.5MCG/ACT Aerosol, Inhalation) Active. Aspirin (81MG  Tablet DR, Oral) Active. glipiZIDE (5MG  Tablet, Oral) Active. Tylenol (325MG  Tablet, Oral) Active. ZyrTEC Allergy (10MG  Tablet, Oral) Active. OneTouch Ultra Blue (In Vitro) Active. Timolol Maleate (0.5% Solution, Ophthalmic) Active. Medications Reconciled    Vitals Emeline Gins CMA; 07/21/2018 11:21 AM) 07/21/2018 11:20 AM Weight: 179.4 lb Height: 63in Body Surface Area: 1.85 m Body Mass Index: 31.78 kg/m  Temp.: 97.68F  Pulse: 87 (Regular)  BP: 158/86 (Sitting, Left Arm, Standard)      Physical Exam Adin Hector MD; 07/21/2018 11:58 AM)  General Mental Status-Alert. General Appearance-Not in acute distress, Not Sickly. Orientation-Oriented X3. Hydration-Well hydrated. Voice-Normal.  Integumentary Global Assessment Upon inspection and palpation of skin surfaces of  the - Axillae: non-tender, no inflammation or ulceration, no drainage. and Distribution of scalp and body hair is normal. General Characteristics Temperature -  normal warmth is noted.  Head and Neck Head-normocephalic, atraumatic with no lesions or palpable masses. Face Global Assessment - atraumatic, no absence of expression. Neck Global Assessment - no abnormal movements, no bruit auscultated on the right, no bruit auscultated on the left, no decreased range of motion, non-tender. Trachea-midline. Thyroid Gland Characteristics - non-tender. Note: Midline fullness and nodularity at the midline of the neck moving with swallowing consistent with isthmus mass. Mild fullness on left thyroid lobe greater than right.  Eye Eyeball - Left-Extraocular movements intact, No Nystagmus. Eyeball - Right-Extraocular movements intact, No Nystagmus. Cornea - Left-No Hazy. Cornea - Right-No Hazy. Sclera/Conjunctiva - Left-No scleral icterus, No Discharge. Sclera/Conjunctiva - Right-No scleral icterus, No Discharge. Pupil - Left-Direct reaction to light normal. Pupil - Right-Direct reaction to light normal. Note: Wears glasses. Vision corrected  ENMT Ears Pinna - Left - no drainage observed, no generalized tenderness observed. Right - no drainage observed, no generalized tenderness observed. Nose and Sinuses External Inspection of the Nose - no destructive lesion observed. Inspection of the nares - Left - quiet respiration. Right - quiet respiration. Mouth and Throat Lips - Upper Lip - no fissures observed, no pallor noted. Lower Lip - no fissures observed, no pallor noted. Nasopharynx - no discharge present. Oral Cavity/Oropharynx - Tongue - no dryness observed. Oral Mucosa - no cyanosis observed. Hypopharynx - no evidence of airway distress observed.  Chest and Lung Exam Inspection Movements - Normal and Symmetrical. Accessory muscles - No use of accessory muscles in  breathing. Palpation Palpation of the chest reveals - Non-tender. Auscultation Breath sounds - Normal and Clear.  Cardiovascular Auscultation Rhythm - Regular. Murmurs & Other Heart Sounds - Auscultation of the heart reveals - No Murmurs and No Systolic Clicks.  Abdomen Inspection Inspection of the abdomen reveals - No Visible peristalsis and No Abnormal pulsations. Umbilicus - No Bleeding, No Urine drainage. Palpation/Percussion Palpation and Percussion of the abdomen reveal - Soft, Non Tender, No Rebound tenderness, No Rigidity (guarding) and No Cutaneous hyperesthesia. Note: Abdomen soft. Not severely distended. No distasis recti. No umbilical or other anterior abdominal wall hernias  Female Genitourinary Sexual Maturity Tanner 5 - Adult hair pattern. Note: No vaginal bleeding nor discharge  Peripheral Vascular Upper Extremity Inspection - Left - No Cyanotic nailbeds, Not Ischemic. Right - No Cyanotic nailbeds, Not Ischemic.  Neurologic Neurologic evaluation reveals -normal attention span and ability to concentrate, able to name objects and repeat phrases. Appropriate fund of knowledge , normal sensation and normal coordination. Mental Status Affect - not angry, not paranoid. Cranial Nerves-Normal Bilaterally. Gait-Normal.  Neuropsychiatric Mental status exam performed with findings of-able to articulate well with normal speech/language, rate, volume and coherence, thought content normal with ability to perform basic computations and apply abstract reasoning and no evidence of hallucinations, delusions, obsessions or homicidal/suicidal ideation.  Musculoskeletal Global Assessment Spine, Ribs and Pelvis - no instability, subluxation or laxity. Right Upper Extremity - no instability, subluxation or laxity. Note: Moderate kyphosis and scoliosis.  Lymphatic Head & Neck  General Head & Neck Lymphatics: Bilateral - Description - No Localized  lymphadenopathy. Axillary  General Axillary Region: Bilateral - Description - No Localized lymphadenopathy. Femoral & Inguinal  Generalized Femoral & Inguinal Lymphatics: Left - Description - No Localized lymphadenopathy. Right - Description - No Localized lymphadenopathy.    Assessment & Plan Adin Hector MD; 07/21/2018 12:08 PM)  CANCER OF ISTHMUS (C54.0) Impression: Multinodular goiter left thyroid lobe and isthmus. Largest nodule 4.6 cm at isthmus. Biopsy consistent with  papillary cancer.  She would benefit from thyroidectomy. Given her multinodular goiter with globus and a central cancer, believe she required total thyroidectomy. Usually overnight stay.  Well get feedback from endocrinology, but it sounds like they will follow her to see if she needs any post adjuvant I then her follow-up and timing of Synthroid intervention for her postoperative hypothyroidism.  Current Plans You are being scheduled for surgery- Our schedulers will call you.  You should hear from our office's scheduling department within 5 working days about the location, date, and time of surgery. We try to make accommodations for patient's preferences in scheduling surgery, but sometimes the OR schedule or the surgeon's schedule prevents [place] from making those accommodations.  If you have not heard from our office [phone]) in 5 working days, call the office and ask for your surgeon's nurse.  If you have other questions about your diagnosis, plan, or surgery, call the office and ask for your surgeon's nurse.  The anatomy and physiology of the thyroid gland and organs of the neck were discussed. Pathophysiology of thyroid problems were discussed. Options were discussed, and I made a recommendation to remove part (and possibly all) of the thyroid gland to treat the pathology.  Risks of bleeding, infection, injury to other organs including nerves, hoarseness, difficulty breathing, reoperation, death, and  other risks were discussed. I noted a good likelihood this will help address the problem. While there are risks, I feel the risks of nonoperative management are greater; therefore, I feel surgery offers the best option. Educational material was given to help further explain the topics & concerns from our discussion. We will work to minimize complications.  Pt Education - Pamphlet Given - The Thyroid Book: discussed with patient and provided information. Pt Education - CCS Thyroid/ Parathyroid HCI  LEFT THYROID NODULE (E04.1) Impression: Smaller left-sided nodule. We'll remove at the time of total thyroidectomy.   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 No major swallowing dysfunction by modified barium swallow. Hopefully will gradually resolve after thyroidectomy. Follow.  Adin Hector, MD, FACS, MASCRS Gastrointestinal and Minimally Invasive Surgery    [address], [place], [place] [address] [phone] Main / Paging [phone] Fax

## 2018-08-02 ENCOUNTER — Other Ambulatory Visit: Payer: Self-pay | Admitting: Family Medicine

## 2018-08-12 ENCOUNTER — Ambulatory Visit: Payer: PPO | Admitting: "Endocrinology

## 2018-08-13 DIAGNOSIS — E11621 Type 2 diabetes mellitus with foot ulcer: Secondary | ICD-10-CM | POA: Diagnosis not present

## 2018-08-13 DIAGNOSIS — M79674 Pain in right toe(s): Secondary | ICD-10-CM | POA: Diagnosis not present

## 2018-08-13 DIAGNOSIS — L03115 Cellulitis of right lower limb: Secondary | ICD-10-CM | POA: Diagnosis not present

## 2018-08-13 DIAGNOSIS — L97512 Non-pressure chronic ulcer of other part of right foot with fat layer exposed: Secondary | ICD-10-CM | POA: Diagnosis not present

## 2018-08-13 DIAGNOSIS — L97519 Non-pressure chronic ulcer of other part of right foot with unspecified severity: Secondary | ICD-10-CM | POA: Diagnosis not present

## 2018-08-20 DIAGNOSIS — L97512 Non-pressure chronic ulcer of other part of right foot with fat layer exposed: Secondary | ICD-10-CM | POA: Diagnosis not present

## 2018-09-10 DIAGNOSIS — L97512 Non-pressure chronic ulcer of other part of right foot with fat layer exposed: Secondary | ICD-10-CM | POA: Diagnosis not present

## 2018-09-10 DIAGNOSIS — M79674 Pain in right toe(s): Secondary | ICD-10-CM | POA: Diagnosis not present

## 2018-09-10 DIAGNOSIS — E11621 Type 2 diabetes mellitus with foot ulcer: Secondary | ICD-10-CM | POA: Diagnosis not present

## 2018-09-27 ENCOUNTER — Ambulatory Visit: Payer: Self-pay | Admitting: Surgery

## 2018-09-28 ENCOUNTER — Other Ambulatory Visit: Payer: Self-pay

## 2018-09-28 MED ORDER — LOSARTAN POTASSIUM 50 MG PO TABS: 50.0000 mg | ORAL_TABLET | Freq: Every day | ORAL | 0 refills | Status: DC

## 2018-10-01 DIAGNOSIS — L97512 Non-pressure chronic ulcer of other part of right foot with fat layer exposed: Secondary | ICD-10-CM | POA: Diagnosis not present

## 2018-10-01 DIAGNOSIS — M79674 Pain in right toe(s): Secondary | ICD-10-CM | POA: Diagnosis not present

## 2018-10-01 DIAGNOSIS — E11621 Type 2 diabetes mellitus with foot ulcer: Secondary | ICD-10-CM | POA: Diagnosis not present

## 2018-10-12 DIAGNOSIS — E1165 Type 2 diabetes mellitus with hyperglycemia: Secondary | ICD-10-CM | POA: Diagnosis not present

## 2018-10-12 DIAGNOSIS — E1122 Type 2 diabetes mellitus with diabetic chronic kidney disease: Secondary | ICD-10-CM | POA: Diagnosis not present

## 2018-10-12 DIAGNOSIS — E785 Hyperlipidemia, unspecified: Secondary | ICD-10-CM | POA: Diagnosis not present

## 2018-10-12 DIAGNOSIS — I10 Essential (primary) hypertension: Secondary | ICD-10-CM | POA: Diagnosis not present

## 2018-10-12 DIAGNOSIS — N183 Chronic kidney disease, stage 3 (moderate): Secondary | ICD-10-CM | POA: Diagnosis not present

## 2018-10-13 LAB — LIPID PANEL
CHOL/HDL RATIO: 6.7 ratio — AB (ref 0.0–4.4)
CHOLESTEROL TOTAL: 261 mg/dL — AB (ref 100–199)
HDL: 39 mg/dL — ABNORMAL LOW (ref >39)
LDL CALC: 173 mg/dL — AB (ref 0–99)
TRIGLYCERIDES: 245 mg/dL — AB (ref 0–149)
VLDL CHOLESTEROL CAL: 49 mg/dL — AB (ref 5–40)

## 2018-10-13 LAB — BASIC METABOLIC PANEL
BUN/Creatinine Ratio: 19 (ref 12–28)
BUN: 24 mg/dL (ref 8–27)
CHLORIDE: 105 mmol/L (ref 96–106)
CO2: 24 mmol/L (ref 20–29)
Calcium: 9.2 mg/dL (ref 8.7–10.3)
Creatinine, Ser: 1.29 mg/dL — ABNORMAL HIGH (ref 0.57–1.00)
GFR calc Af Amer: 43 mL/min/{1.73_m2} — ABNORMAL LOW (ref >59)
GFR, EST NON AFRICAN AMERICAN: 37 mL/min/{1.73_m2} — AB (ref >59)
Glucose: 151 mg/dL — ABNORMAL HIGH (ref 65–99)
POTASSIUM: 4.7 mmol/L (ref 3.5–5.2)
SODIUM: 143 mmol/L (ref 134–144)

## 2018-10-13 LAB — HEMOGLOBIN A1C
Est. average glucose Bld gHb Est-mCnc: 203 mg/dL
Hgb A1c MFr Bld: 8.7 % — ABNORMAL HIGH (ref 4.8–5.6)

## 2018-10-20 ENCOUNTER — Ambulatory Visit (INDEPENDENT_AMBULATORY_CARE_PROVIDER_SITE_OTHER): Payer: PPO | Admitting: Family Medicine

## 2018-10-20 ENCOUNTER — Other Ambulatory Visit: Payer: Self-pay

## 2018-10-20 ENCOUNTER — Telehealth: Payer: Self-pay | Admitting: Family Medicine

## 2018-10-20 DIAGNOSIS — E1165 Type 2 diabetes mellitus with hyperglycemia: Secondary | ICD-10-CM | POA: Diagnosis not present

## 2018-10-20 DIAGNOSIS — I10 Essential (primary) hypertension: Secondary | ICD-10-CM

## 2018-10-20 DIAGNOSIS — IMO0002 Reserved for concepts with insufficient information to code with codable children: Secondary | ICD-10-CM

## 2018-10-20 DIAGNOSIS — E1122 Type 2 diabetes mellitus with diabetic chronic kidney disease: Secondary | ICD-10-CM | POA: Diagnosis not present

## 2018-10-20 DIAGNOSIS — N183 Chronic kidney disease, stage 3 (moderate): Secondary | ICD-10-CM | POA: Diagnosis not present

## 2018-10-20 DIAGNOSIS — C73 Malignant neoplasm of thyroid gland: Secondary | ICD-10-CM | POA: Diagnosis not present

## 2018-10-20 NOTE — Telephone Encounter (Signed)
Patient is aware of all. 

## 2018-10-20 NOTE — Progress Notes (Signed)
   Subjective:    Patient ID: Victoria Vasquez, female    DOB: 1927-09-27, 85 y.o.   MRN: [id number]  Hypertension  This is a chronic problem. Pertinent negatives include no chest pain or shortness of breath. Treatments tried: losartan. There are no compliance problems (takes meds every day, eats healthy, exercises).   Patient does not tolerate statins a cause a lot of muscle pain and joint pain she does not want to take them for cholesterol does want Her diabetes not been under good control she is trying to do the good job watching her diet she states her morning sugars are around 130 but her evening sugars are closer to 200 She also has thyroid cancer and will be getting surgery coming up in June Virtual Visit via Telephone Note  I connected with Victoria Vasquez on 10/20/18 at 10:00 AM EDT by telephone and verified that I am speaking with the correct person using two identifiers.  Location: Patient: home Provider: office   I discussed the limitations, risks, security and privacy concerns of performing an evaluation and management service by telephone and the availability of in person appointments. I also discussed with the patient that there may be a patient responsible charge related to this service. The patient expressed understanding and agreed to proceed.   History of Present Illness:    Observations/Objective:   Assessment and Plan:   Follow Up Instructions:    I discussed the assessment and treatment plan with the patient. The patient was provided an opportunity to ask questions and all were answered. The patient agreed with the plan and demonstrated an understanding of the instructions.   The patient was advised to call back or seek an in-person evaluation if the symptoms worsen or if the condition fails to improve as anticipated.  I provided 15 minutes of non-face-to-face time during this encounter.      Review of Systems  Constitutional: Negative for activity change,  appetite change and fatigue.  HENT: Negative for congestion and rhinorrhea.   Respiratory: Negative for cough and shortness of breath.   Cardiovascular: Negative for chest pain and leg swelling.  Gastrointestinal: Negative for abdominal pain and diarrhea.  Endocrine: Negative for polydipsia and polyphagia.  Skin: Negative for color change.  Neurological: Negative for dizziness and weakness.  Psychiatric/Behavioral: Negative for behavioral problems and confusion.       Objective:   Physical Exam   Today's visit was via telephone Physical exam was not possible for this visit      Assessment & Plan:  Blood pressure reportedly doing good under good control continue current measures  Diabetes A1c higher than what we would like to see but patient cannot afford higher technology medications.  Plus also she is not a good candidate for metformin plus also she is legally blind which would make insulin difficult for this patient given the circumstances the best we can do is using glipizide avoid low sugars and be satisfied with an A1c in the 8 range  Upcoming thyroid surgery hopefully she will do well she certainly has increased risk of complications but I tried to give her encouragement  She will need to follow-up with Dr. Dorris Fetch after her thyroid surgery

## 2018-10-20 NOTE — Progress Notes (Signed)
Patient is aware of all. 

## 2018-10-20 NOTE — Progress Notes (Signed)
   Subjective:    Patient ID: Victoria Vasquez, female    DOB: [birth year], 85 y.o.   MRN: [id number]  HPI    Review of Systems     Objective:   Physical Exam        Assessment & Plan:

## 2018-10-20 NOTE — Telephone Encounter (Signed)
Please let the patient know I communicated with her surgeon and that her surgeon stated that more than likely she will spend 1 night in the hospital-that is standard then she can go home the following day  She has an upcoming removal of her thyroid on the 17th

## 2018-10-28 ENCOUNTER — Ambulatory Visit: Payer: PPO | Admitting: "Endocrinology

## 2018-10-29 ENCOUNTER — Other Ambulatory Visit (HOSPITAL_COMMUNITY): Payer: PPO

## 2018-10-29 DIAGNOSIS — M79674 Pain in right toe(s): Secondary | ICD-10-CM | POA: Diagnosis not present

## 2018-10-29 DIAGNOSIS — E11621 Type 2 diabetes mellitus with foot ulcer: Secondary | ICD-10-CM | POA: Diagnosis not present

## 2018-10-29 NOTE — Patient Instructions (Addendum)
Rosine Beat    Your procedure is scheduled on: 17-2020  Report to [place]  Entrance  Report to SHORT STAY at 630  AM   YOU NEED TO HAVE A COVID 19 TEST ON 10-30-2018 @1055  AM, THIS TEST MUST BE DONE BEFORE SURGERY, COME TO [place]. ONCE YOUR COVID TEST IS COMPLETED, PLEASE BEGIN THE QUARANTINE INSTRUCTIONS AS OUTLINED IN YOUR HANDOUT.   Call this number if you have problems the morning of surgery [phone]    Remember: Do not eat food or drink liquids :After Midnight. BRUSH YOUR TEETH MORNING OF SURGERY AND RINSE YOUR MOUTH OUT, NO CHEWING GUM CANDY OR MINTS.     Take these medicines the morning of surgery with A SIP OF WATER: SYMBICORT, ALBUTEROL INHALER IF NEEDED AND BRING INHALER, PANTAPRAZOLE ([place])  DO NOT TAKE ANY DIABETIC MEDICATIONS DAY OF YOUR SURGERY            How to Manage Your Diabetes Before and After Surgery  Why is it important to control my blood sugar before and after surgery? . Improving blood sugar levels before and after surgery helps healing and can limit problems. . A way of improving blood sugar control is eating a healthy diet by: o  Eating less sugar and carbohydrates o  Increasing activity/exercise o  Talking with your doctor about reaching your blood sugar goals . High blood sugars (greater than 180 mg/dL) can raise your risk of infections and slow your recovery, so you will need to focus on controlling your diabetes during the weeks before surgery. . Make sure that the doctor who takes care of your diabetes knows about your planned surgery including the date and location.  How do I manage my blood sugar before surgery? . Check your blood sugar at least 4 times a day, starting 2 days before surgery, to make sure that the level is not too high or low. o Check your blood sugar the morning of your surgery when you wake up and every 2 hours until you get to the Short Stay  unit. . If your blood sugar is less than 70 mg/dL, you will need to treat for low blood sugar: o Do not take insulin. o Treat a low blood sugar (less than 70 mg/dL) with  cup of clear juice (cranberry or apple), 4 glucose tablets, OR glucose gel. o Recheck blood sugar in 15 minutes after treatment (to make sure it is greater than 70 mg/dL). If your blood sugar is not greater than 70 mg/dL on recheck, call [phone] for further instructions. . Report your blood sugar to the short stay nurse when you get to Short Stay.  . If you are admitted to the hospital after surgery: o Your blood sugar will be checked by the staff and you will probably be given insulin after surgery (instead of oral diabetes medicines) to make sure you have good blood sugar levels. o The goal for blood sugar control after surgery is 80-180 mg/dL.   WHAT DO I DO ABOUT MY DIABETES MEDICATION?  Marland Kitchen Do not take oral diabetes medicines (pills) the morning of surgery.  . THE DAY  BEFORE SURGERY TAKE MORNING DOSE OF GLIPIZIDE, DO NOT TAKE EVENING DOSE OF GLIPIZIDE    THE MORNING OF SURGERY DO NOT TAKE GLIPIZIDE    Reviewed and Endorsed by [facility] Patient Education Committee, August 2015  You may not have any metal on your body including hair pins and              piercings  Do not wear jewelry, make-up, lotions, powders or perfumes, deodorant             Do not wear nail polish.  Do not shave  48 hours prior to surgery.              Men may shave face and neck.   Do not bring valuables to the hospital. [place].  Contacts, dentures or bridgework may not be worn into surgery.  Leave suitcase in the car. After surgery it may be brought to your room.      _____________________________________________________________________             [facility] - Preparing for Surgery Before surgery, you can play an important role.  Because skin is not  sterile, your skin needs to be as free of germs as possible.  You can reduce the number of germs on your skin by washing with CHG (chlorahexidine gluconate) soap before surgery.  CHG is an antiseptic cleaner which kills germs and bonds with the skin to continue killing germs even after washing. Please DO NOT use if you have an allergy to CHG or antibacterial soaps.  If your skin becomes reddened/irritated stop using the CHG and inform your nurse when you arrive at Short Stay. Do not shave (including legs and underarms) for at least 48 hours prior to the first CHG shower.  You may shave your face/neck. Please follow these instructions carefully:  1.  Shower with CHG Soap the night before surgery and the  morning of Surgery.  2.  If you choose to wash your hair, wash your hair first as usual with your  normal  shampoo.  3.  After you shampoo, rinse your hair and body thoroughly to remove the  shampoo.                           4.  Use CHG as you would any other liquid soap.  You can apply chg directly  to the skin and wash                       Gently with a scrungie or clean washcloth.  5.  Apply the CHG Soap to your body ONLY FROM THE NECK DOWN.   Do not use on face/ open                           Wound or open sores. Avoid contact with eyes, ears mouth and genitals (private parts).                       Wash face,  Genitals (private parts) with your normal soap.             6.  Wash thoroughly, paying special attention to the area where your surgery  will be performed.  7.  Thoroughly rinse your body with warm water from the neck down.  8.  DO NOT shower/wash with your normal soap after using and rinsing off  the CHG Soap.  9.  Pat yourself dry with a clean towel.            10.  Wear clean pajamas.            11.  Place clean sheets on your bed the night of your first shower and do not  sleep with pets. Day of Surgery : Do not apply any lotions/deodorants the morning of surgery.   Please wear clean clothes to the hospital/surgery center.  FAILURE TO FOLLOW THESE INSTRUCTIONS MAY RESULT IN THE CANCELLATION OF YOUR SURGERY PATIENT SIGNATURE_________________________________  NURSE SIGNATURE__________________________________  ________________________________________________________________________

## 2018-10-30 ENCOUNTER — Other Ambulatory Visit (HOSPITAL_COMMUNITY)
Admission: RE | Admit: 2018-10-30 | Discharge: 2018-10-30 | Disposition: A | Discharge status: Home / Self Care | Payer: PPO | Source: Ambulatory Visit | Attending: Surgery | Admitting: Surgery

## 2018-10-30 DIAGNOSIS — Z1159 Encounter for screening for other viral diseases: Secondary | ICD-10-CM | POA: Insufficient documentation

## 2018-11-01 ENCOUNTER — Encounter (HOSPITAL_COMMUNITY): Payer: Self-pay

## 2018-11-01 ENCOUNTER — Other Ambulatory Visit: Payer: Self-pay

## 2018-11-01 ENCOUNTER — Encounter (HOSPITAL_COMMUNITY)
Admission: RE | Admit: 2018-11-01 | Discharge: 2018-11-01 | Disposition: A | Discharge status: Home / Self Care | Payer: PPO | Source: Ambulatory Visit | Attending: Surgery | Admitting: Surgery

## 2018-11-01 DIAGNOSIS — I129 Hypertensive chronic kidney disease with stage 1 through stage 4 chronic kidney disease, or unspecified chronic kidney disease: Secondary | ICD-10-CM | POA: Diagnosis not present

## 2018-11-01 DIAGNOSIS — H548 Legal blindness, as defined in USA: Secondary | ICD-10-CM | POA: Diagnosis not present

## 2018-11-01 DIAGNOSIS — R9431 Abnormal electrocardiogram [ECG] [EKG]: Secondary | ICD-10-CM | POA: Insufficient documentation

## 2018-11-01 DIAGNOSIS — H409 Unspecified glaucoma: Secondary | ICD-10-CM | POA: Diagnosis not present

## 2018-11-01 DIAGNOSIS — N183 Chronic kidney disease, stage 3 (moderate): Secondary | ICD-10-CM | POA: Diagnosis not present

## 2018-11-01 DIAGNOSIS — K219 Gastro-esophageal reflux disease without esophagitis: Secondary | ICD-10-CM | POA: Diagnosis not present

## 2018-11-01 DIAGNOSIS — Z853 Personal history of malignant neoplasm of breast: Secondary | ICD-10-CM | POA: Diagnosis not present

## 2018-11-01 DIAGNOSIS — Z79899 Other long term (current) drug therapy: Secondary | ICD-10-CM | POA: Diagnosis not present

## 2018-11-01 DIAGNOSIS — E042 Nontoxic multinodular goiter: Secondary | ICD-10-CM | POA: Diagnosis not present

## 2018-11-01 DIAGNOSIS — E1122 Type 2 diabetes mellitus with diabetic chronic kidney disease: Secondary | ICD-10-CM | POA: Diagnosis not present

## 2018-11-01 DIAGNOSIS — J449 Chronic obstructive pulmonary disease, unspecified: Secondary | ICD-10-CM | POA: Diagnosis not present

## 2018-11-01 DIAGNOSIS — E119 Type 2 diabetes mellitus without complications: Secondary | ICD-10-CM | POA: Insufficient documentation

## 2018-11-01 DIAGNOSIS — C73 Malignant neoplasm of thyroid gland: Secondary | ICD-10-CM | POA: Insufficient documentation

## 2018-11-01 DIAGNOSIS — I4510 Unspecified right bundle-branch block: Secondary | ICD-10-CM | POA: Insufficient documentation

## 2018-11-01 DIAGNOSIS — Z7982 Long term (current) use of aspirin: Secondary | ICD-10-CM | POA: Diagnosis not present

## 2018-11-01 DIAGNOSIS — Z7722 Contact with and (suspected) exposure to environmental tobacco smoke (acute) (chronic): Secondary | ICD-10-CM | POA: Diagnosis not present

## 2018-11-01 DIAGNOSIS — E785 Hyperlipidemia, unspecified: Secondary | ICD-10-CM | POA: Diagnosis not present

## 2018-11-01 DIAGNOSIS — Z7984 Long term (current) use of oral hypoglycemic drugs: Secondary | ICD-10-CM | POA: Diagnosis not present

## 2018-11-01 DIAGNOSIS — Z01818 Encounter for other preprocedural examination: Secondary | ICD-10-CM | POA: Insufficient documentation

## 2018-11-01 DIAGNOSIS — E063 Autoimmune thyroiditis: Secondary | ICD-10-CM | POA: Diagnosis not present

## 2018-11-01 DIAGNOSIS — Z7951 Long term (current) use of inhaled steroids: Secondary | ICD-10-CM | POA: Diagnosis not present

## 2018-11-01 LAB — BASIC METABOLIC PANEL WITH GFR
Anion gap: 8 (ref 5–15)
BUN: 29 mg/dL — ABNORMAL HIGH (ref 8–23)
CO2: 24 mmol/L (ref 22–32)
Calcium: 9 mg/dL (ref 8.9–10.3)
Chloride: 110 mmol/L (ref 98–111)
Creatinine, Ser: 1.08 mg/dL — ABNORMAL HIGH (ref 0.44–1.00)
GFR calc Af Amer: 53 mL/min — ABNORMAL LOW
GFR calc non Af Amer: 46 mL/min — ABNORMAL LOW
Glucose, Bld: 184 mg/dL — ABNORMAL HIGH (ref 70–99)
Potassium: 4.5 mmol/L (ref 3.5–5.1)
Sodium: 142 mmol/L (ref 135–145)

## 2018-11-01 LAB — GLUCOSE, CAPILLARY: Glucose-Capillary: 191 mg/dL — ABNORMAL HIGH (ref 70–99)

## 2018-11-01 LAB — CBC
HCT: 39 % (ref 36.0–46.0)
Hemoglobin: 12.4 g/dL (ref 12.0–15.0)
MCH: 30.2 pg (ref 26.0–34.0)
MCHC: 31.8 g/dL (ref 30.0–36.0)
MCV: 94.9 fL (ref 80.0–100.0)
Platelets: 142 K/uL — ABNORMAL LOW (ref 150–400)
RBC: 4.11 MIL/uL (ref 3.87–5.11)
RDW: 13.9 % (ref 11.5–15.5)
WBC: 5.7 K/uL (ref 4.0–10.5)
nRBC: 0 % (ref 0.0–0.2)

## 2018-11-01 LAB — HEMOGLOBIN A1C
Hgb A1c MFr Bld: 8.4 % — ABNORMAL HIGH (ref 4.8–5.6)
Mean Plasma Glucose: 194.38 mg/dL

## 2018-11-01 LAB — NOVEL CORONAVIRUS, NAA (HOSP ORDER, SEND-OUT TO REF LAB; TAT 18-24 HRS): SARS-CoV-2, NAA: NOT DETECTED

## 2018-11-01 NOTE — Progress Notes (Addendum)
Konrad Felix PA   Pt has stopped her ASA 10/27/18. Instructions and prescribed by Dr. Wolfgang Phoenix. EKG in chart and in Epic. Please review labs. HbgA1c 8.4, Creat, BUN are elevated

## 2018-11-02 ENCOUNTER — Other Ambulatory Visit: Payer: Self-pay | Admitting: Family Medicine

## 2018-11-02 NOTE — Anesthesia Preprocedure Evaluation (Addendum)
Anesthesia Evaluation  Patient identified by MRN, date of birth, ID band Patient awake    Reviewed: Allergy & Precautions, H&P , NPO status , Patient's Chart, lab work & pertinent test results  Airway Mallampati: II  TM Distance: >3 FB Neck ROM: Full    Dental no notable dental hx. (+) Teeth Intact, Dental Advisory Given   Pulmonary asthma , COPD,  COPD inhaler,    Pulmonary exam normal breath sounds clear to auscultation       Cardiovascular Exercise Tolerance: Good hypertension, Pt. on medications  Rhythm:Regular Rate:Normal     Neuro/Psych negative neurological ROS  negative psych ROS   GI/Hepatic Neg liver ROS, GERD  ,  Endo/Other  diabetes, Type 2, Oral Hypoglycemic Agents  Renal/GU Renal InsufficiencyRenal disease  negative genitourinary   Musculoskeletal  (+) Arthritis , Osteoarthritis,    Abdominal   Peds  Hematology negative hematology ROS (+)   Anesthesia Other Findings   Reproductive/Obstetrics negative OB ROS                            Anesthesia Physical Anesthesia Plan  ASA: II  Anesthesia Plan: General   Post-op Pain Management:    Induction: Intravenous  PONV Risk Score and Plan: 4 or greater and Ondansetron, Treatment may vary due to age or medical condition and Dexamethasone  Airway Management Planned: Oral ETT  Additional Equipment:   Intra-op Plan:   Post-operative Plan: Extubation in OR  Informed Consent: I have reviewed the patients History and Physical, chart, labs and discussed the procedure including the risks, benefits and alternatives for the proposed anesthesia with the patient or authorized representative who has indicated his/her understanding and acceptance.     Dental advisory given  Plan Discussed with: CRNA  Anesthesia Plan Comments:         Anesthesia Quick Evaluation

## 2018-11-02 NOTE — Progress Notes (Signed)
SPOKE W/  _[name]     SCREENING SYMPTOMS OF COVID 19:no   COUGH--no  RUNNY NOSE--- no  SORE THROAT---no  NASAL CONGESTION----no  SNEEZING----no  SHORTNESS OF BREATH---no  DIFFICULTY BREATHING---no  TEMP >100.0 -----no  UNEXPLAINED BODY ACHES------no  CHILLS -------- no  HEADACHES ---------no  LOSS OF SMELL/ TASTE --------no    HAVE YOU OR ANY FAMILY MEMBER TRAVELLED PAST 14 DAYS OUT OF THE   COUNTY---no STATE----no COUNTRY----no  HAVE YOU OR ANY FAMILY MEMBER BEEN EXPOSED TO ANYONE WITH COVID 19? no

## 2018-11-03 ENCOUNTER — Other Ambulatory Visit: Payer: Self-pay

## 2018-11-03 ENCOUNTER — Observation Stay (HOSPITAL_COMMUNITY)
Admission: RE | Admit: 2018-11-03 | Discharge: 2018-11-04 | Disposition: A | Discharge status: Home / Self Care | Payer: PPO | Source: Other Acute Inpatient Hospital | Attending: Surgery | Admitting: Surgery

## 2018-11-03 ENCOUNTER — Encounter (HOSPITAL_COMMUNITY)
Admission: RE | Disposition: A | Discharge status: Home / Self Care | Payer: Self-pay | Source: Other Acute Inpatient Hospital | Attending: Surgery

## 2018-11-03 ENCOUNTER — Encounter (HOSPITAL_COMMUNITY): Payer: Self-pay | Admitting: *Deleted

## 2018-11-03 ENCOUNTER — Ambulatory Visit (HOSPITAL_COMMUNITY): Payer: PPO | Admitting: Certified Registered Nurse Anesthetist

## 2018-11-03 DIAGNOSIS — I129 Hypertensive chronic kidney disease with stage 1 through stage 4 chronic kidney disease, or unspecified chronic kidney disease: Secondary | ICD-10-CM | POA: Insufficient documentation

## 2018-11-03 DIAGNOSIS — Z7984 Long term (current) use of oral hypoglycemic drugs: Secondary | ICD-10-CM | POA: Insufficient documentation

## 2018-11-03 DIAGNOSIS — E042 Nontoxic multinodular goiter: Secondary | ICD-10-CM | POA: Insufficient documentation

## 2018-11-03 DIAGNOSIS — E063 Autoimmune thyroiditis: Secondary | ICD-10-CM | POA: Diagnosis not present

## 2018-11-03 DIAGNOSIS — C540 Malignant neoplasm of isthmus uteri: Secondary | ICD-10-CM | POA: Diagnosis not present

## 2018-11-03 DIAGNOSIS — E1122 Type 2 diabetes mellitus with diabetic chronic kidney disease: Secondary | ICD-10-CM | POA: Insufficient documentation

## 2018-11-03 DIAGNOSIS — E785 Hyperlipidemia, unspecified: Secondary | ICD-10-CM | POA: Diagnosis not present

## 2018-11-03 DIAGNOSIS — J449 Chronic obstructive pulmonary disease, unspecified: Secondary | ICD-10-CM | POA: Diagnosis not present

## 2018-11-03 DIAGNOSIS — C73 Malignant neoplasm of thyroid gland: Secondary | ICD-10-CM | POA: Diagnosis not present

## 2018-11-03 DIAGNOSIS — Z7722 Contact with and (suspected) exposure to environmental tobacco smoke (acute) (chronic): Secondary | ICD-10-CM | POA: Insufficient documentation

## 2018-11-03 DIAGNOSIS — Z7951 Long term (current) use of inhaled steroids: Secondary | ICD-10-CM | POA: Insufficient documentation

## 2018-11-03 DIAGNOSIS — Z79899 Other long term (current) drug therapy: Secondary | ICD-10-CM | POA: Diagnosis not present

## 2018-11-03 DIAGNOSIS — Z7982 Long term (current) use of aspirin: Secondary | ICD-10-CM | POA: Diagnosis not present

## 2018-11-03 DIAGNOSIS — N183 Chronic kidney disease, stage 3 (moderate): Secondary | ICD-10-CM | POA: Diagnosis not present

## 2018-11-03 DIAGNOSIS — H409 Unspecified glaucoma: Secondary | ICD-10-CM | POA: Insufficient documentation

## 2018-11-03 DIAGNOSIS — E876 Hypokalemia: Secondary | ICD-10-CM | POA: Diagnosis not present

## 2018-11-03 DIAGNOSIS — I10 Essential (primary) hypertension: Secondary | ICD-10-CM | POA: Diagnosis present

## 2018-11-03 DIAGNOSIS — Z853 Personal history of malignant neoplasm of breast: Secondary | ICD-10-CM | POA: Insufficient documentation

## 2018-11-03 DIAGNOSIS — K219 Gastro-esophageal reflux disease without esophagitis: Secondary | ICD-10-CM | POA: Insufficient documentation

## 2018-11-03 DIAGNOSIS — H548 Legal blindness, as defined in USA: Secondary | ICD-10-CM | POA: Insufficient documentation

## 2018-11-03 HISTORY — PX: THYROIDECTOMY: SHX17

## 2018-11-03 LAB — GLUCOSE, CAPILLARY
Glucose-Capillary: 138 mg/dL — ABNORMAL HIGH (ref 70–99)
Glucose-Capillary: 216 mg/dL — ABNORMAL HIGH (ref 70–99)
Glucose-Capillary: 241 mg/dL — ABNORMAL HIGH (ref 70–99)
Glucose-Capillary: 243 mg/dL — ABNORMAL HIGH (ref 70–99)
Glucose-Capillary: 276 mg/dL — ABNORMAL HIGH (ref 70–99)
Glucose-Capillary: 215 mg/dL — ABNORMAL HIGH (ref 70–99)

## 2018-11-03 SURGERY — THYROIDECTOMY
Anesthesia: General | Site: Neck

## 2018-11-03 MED ORDER — SODIUM CHLORIDE 0.9% FLUSH: 3.0000 mL | INTRAVENOUS | Status: DC | PRN

## 2018-11-03 MED ORDER — INSULIN ASPART 100 UNIT/ML ~~LOC~~ SOLN
2.0000 [IU] | Freq: Once | SUBCUTANEOUS | Status: AC
  Administered 2018-11-03: 2 [IU] via SUBCUTANEOUS

## 2018-11-03 MED ORDER — ACETAMINOPHEN 500 MG PO TABS: 500.0000 mg | ORAL_TABLET | Freq: Four times a day (QID) | ORAL | Status: DC | PRN

## 2018-11-03 MED ORDER — TRAMADOL HCL 50 MG PO TABS: 50.0000 mg | ORAL_TABLET | Freq: Four times a day (QID) | ORAL | 0 refills | Status: DC | PRN

## 2018-11-03 MED ORDER — PHENYLEPHRINE HCL (PRESSORS) 10 MG/ML IV SOLN
INTRAVENOUS | Status: AC
  Filled 2018-11-03: qty 1

## 2018-11-03 MED ORDER — ONDANSETRON HCL 4 MG/2ML IJ SOLN
INTRAMUSCULAR | Status: AC
  Filled 2018-11-03: qty 2

## 2018-11-03 MED ORDER — LIP MEDEX EX OINT
TOPICAL_OINTMENT | CUTANEOUS | Status: AC
  Filled 2018-11-03: qty 7

## 2018-11-03 MED ORDER — ALUM & MAG HYDROXIDE-SIMETH 200-200-20 MG/5ML PO SUSP: 30.0000 mL | Freq: Four times a day (QID) | ORAL | Status: DC | PRN

## 2018-11-03 MED ORDER — ONDANSETRON HCL 4 MG/2ML IJ SOLN: 4.0000 mg | Freq: Four times a day (QID) | INTRAMUSCULAR | Status: DC | PRN

## 2018-11-03 MED ORDER — PROPOFOL 10 MG/ML IV BOLUS
INTRAVENOUS | Status: AC
  Filled 2018-11-03: qty 20

## 2018-11-03 MED ORDER — ALBUTEROL SULFATE (2.5 MG/3ML) 0.083% IN NEBU: 2.5000 mg | INHALATION_SOLUTION | RESPIRATORY_TRACT | Status: DC | PRN

## 2018-11-03 MED ORDER — LIDOCAINE 2% (20 MG/ML) 5 ML SYRINGE
INTRAMUSCULAR | Status: DC | PRN
  Administered 2018-11-03: 1 mg/kg/h via INTRAVENOUS

## 2018-11-03 MED ORDER — BUPIVACAINE-EPINEPHRINE (PF) 0.25% -1:200000 IJ SOLN
INTRAMUSCULAR | Status: DC | PRN
  Administered 2018-11-03: 30 mL

## 2018-11-03 MED ORDER — DIPHENHYDRAMINE HCL 12.5 MG/5ML PO ELIX: 12.5000 mg | ORAL_SOLUTION | Freq: Four times a day (QID) | ORAL | Status: DC | PRN

## 2018-11-03 MED ORDER — SUCCINYLCHOLINE CHLORIDE 200 MG/10ML IV SOSY
PREFILLED_SYRINGE | INTRAVENOUS | Status: AC
  Filled 2018-11-03: qty 10

## 2018-11-03 MED ORDER — ACETAMINOPHEN 500 MG PO TABS
1000.0000 mg | ORAL_TABLET | Freq: Once | ORAL | Status: DC
  Filled 2018-11-03: qty 2

## 2018-11-03 MED ORDER — LATANOPROST 0.005 % OP SOLN
1.0000 [drp] | Freq: Every day | OPHTHALMIC | Status: DC
  Administered 2018-11-03: 22:00:00 1 [drp] via OPHTHALMIC
  Filled 2018-11-03: qty 2.5

## 2018-11-03 MED ORDER — FENTANYL CITRATE (PF) 100 MCG/2ML IJ SOLN
25.0000 ug | INTRAMUSCULAR | Status: DC | PRN
  Administered 2018-11-03 (×2): 50 ug via INTRAVENOUS

## 2018-11-03 MED ORDER — DIPHENHYDRAMINE HCL 50 MG/ML IJ SOLN: 12.5000 mg | Freq: Four times a day (QID) | INTRAMUSCULAR | Status: DC | PRN

## 2018-11-03 MED ORDER — LIDOCAINE 2% (20 MG/ML) 5 ML SYRINGE
INTRAMUSCULAR | Status: DC | PRN
  Administered 2018-11-03: 50 mg via INTRAVENOUS

## 2018-11-03 MED ORDER — ACETAMINOPHEN 500 MG PO TABS
1000.0000 mg | ORAL_TABLET | ORAL | Status: AC
  Administered 2018-11-03: 1000 mg via ORAL

## 2018-11-03 MED ORDER — HYDRALAZINE HCL 20 MG/ML IJ SOLN
INTRAMUSCULAR | Status: AC
  Filled 2018-11-03: qty 1

## 2018-11-03 MED ORDER — GLIPIZIDE 5 MG PO TABS
2.5000 mg | ORAL_TABLET | Freq: Every day | ORAL | Status: DC
  Administered 2018-11-03: 2.5 mg via ORAL
  Filled 2018-11-03: qty 1

## 2018-11-03 MED ORDER — ASPIRIN 81 MG PO CHEW
81.0000 mg | CHEWABLE_TABLET | Freq: Every day | ORAL | Status: DC
  Administered 2018-11-04: 81 mg via ORAL
  Filled 2018-11-03: qty 1

## 2018-11-03 MED ORDER — LIDOCAINE 2% (20 MG/ML) 5 ML SYRINGE
INTRAMUSCULAR | Status: AC
  Filled 2018-11-03: qty 5

## 2018-11-03 MED ORDER — PROCHLORPERAZINE MALEATE 10 MG PO TABS: 10.0000 mg | ORAL_TABLET | Freq: Four times a day (QID) | ORAL | Status: DC | PRN

## 2018-11-03 MED ORDER — LACTATED RINGERS IV BOLUS: 1000.0000 mL | Freq: Three times a day (TID) | INTRAVENOUS | Status: DC | PRN

## 2018-11-03 MED ORDER — EPHEDRINE SULFATE-NACL 50-0.9 MG/10ML-% IV SOSY
PREFILLED_SYRINGE | INTRAVENOUS | Status: DC | PRN
  Administered 2018-11-03: 10 mg via INTRAVENOUS
  Administered 2018-11-03 (×2): 5 mg via INTRAVENOUS

## 2018-11-03 MED ORDER — SODIUM CHLORIDE 0.9% FLUSH
3.0000 mL | Freq: Two times a day (BID) | INTRAVENOUS | Status: DC
  Administered 2018-11-03 (×2): 3 mL via INTRAVENOUS

## 2018-11-03 MED ORDER — SUGAMMADEX SODIUM 200 MG/2ML IV SOLN
INTRAVENOUS | Status: AC
  Filled 2018-11-03: qty 2

## 2018-11-03 MED ORDER — ROCURONIUM BROMIDE 10 MG/ML (PF) SYRINGE
PREFILLED_SYRINGE | INTRAVENOUS | Status: AC
  Filled 2018-11-03: qty 10

## 2018-11-03 MED ORDER — INSULIN ASPART 100 UNIT/ML ~~LOC~~ SOLN
0.0000 [IU] | Freq: Every day | SUBCUTANEOUS | Status: DC
  Administered 2018-11-03: 2 [IU] via SUBCUTANEOUS

## 2018-11-03 MED ORDER — ONDANSETRON HCL 4 MG PO TABS: 4.0000 mg | ORAL_TABLET | Freq: Four times a day (QID) | ORAL | Status: DC | PRN

## 2018-11-03 MED ORDER — DEXAMETHASONE SODIUM PHOSPHATE 10 MG/ML IJ SOLN
INTRAMUSCULAR | Status: DC | PRN
  Administered 2018-11-03: 10 mg via INTRAVENOUS

## 2018-11-03 MED ORDER — METOPROLOL TARTRATE 5 MG/5ML IV SOLN: 5.0000 mg | Freq: Four times a day (QID) | INTRAVENOUS | Status: DC | PRN

## 2018-11-03 MED ORDER — SODIUM CHLORIDE 0.9 % IV SOLN
2.0000 g | Freq: Two times a day (BID) | INTRAVENOUS | Status: AC
  Administered 2018-11-03: 2 g via INTRAVENOUS
  Filled 2018-11-03: qty 2

## 2018-11-03 MED ORDER — ENOXAPARIN SODIUM 40 MG/0.4ML ~~LOC~~ SOLN
40.0000 mg | SUBCUTANEOUS | Status: DC
  Administered 2018-11-04: 40 mg via SUBCUTANEOUS
  Filled 2018-11-03: qty 0.4

## 2018-11-03 MED ORDER — HYDROMORPHONE HCL 1 MG/ML IJ SOLN
0.5000 mg | INTRAMUSCULAR | Status: DC | PRN
  Administered 2018-11-03: 0.5 mg via INTRAVENOUS
  Filled 2018-11-03: qty 1

## 2018-11-03 MED ORDER — ONDANSETRON HCL 4 MG/2ML IJ SOLN
INTRAMUSCULAR | Status: DC | PRN
  Administered 2018-11-03: 4 mg via INTRAVENOUS

## 2018-11-03 MED ORDER — BUPIVACAINE-EPINEPHRINE (PF) 0.25% -1:200000 IJ SOLN
INTRAMUSCULAR | Status: AC
  Filled 2018-11-03: qty 30

## 2018-11-03 MED ORDER — SUGAMMADEX SODIUM 200 MG/2ML IV SOLN
INTRAVENOUS | Status: DC | PRN
  Administered 2018-11-03: 200 mg via INTRAVENOUS

## 2018-11-03 MED ORDER — SACCHAROMYCES BOULARDII 250 MG PO CAPS
250.0000 mg | ORAL_CAPSULE | Freq: Two times a day (BID) | ORAL | Status: DC
  Administered 2018-11-03 – 2018-11-04 (×3): 250 mg via ORAL
  Filled 2018-11-03 (×3): qty 1

## 2018-11-03 MED ORDER — DEXAMETHASONE SODIUM PHOSPHATE 10 MG/ML IJ SOLN
INTRAMUSCULAR | Status: AC
  Filled 2018-11-03: qty 1

## 2018-11-03 MED ORDER — CEFAZOLIN SODIUM-DEXTROSE 2-4 GM/100ML-% IV SOLN
2.0000 g | INTRAVENOUS | Status: AC
  Administered 2018-11-03: 09:00:00 2 g via INTRAVENOUS
  Filled 2018-11-03: qty 100

## 2018-11-03 MED ORDER — KETAMINE HCL 10 MG/ML IJ SOLN
INTRAMUSCULAR | Status: AC
  Filled 2018-11-03: qty 1

## 2018-11-03 MED ORDER — PROPOFOL 10 MG/ML IV BOLUS
INTRAVENOUS | Status: DC | PRN
  Administered 2018-11-03: 80 mg via INTRAVENOUS

## 2018-11-03 MED ORDER — EPHEDRINE 5 MG/ML INJ
INTRAVENOUS | Status: AC
  Filled 2018-11-03: qty 10

## 2018-11-03 MED ORDER — FENTANYL CITRATE (PF) 100 MCG/2ML IJ SOLN
INTRAMUSCULAR | Status: AC
  Filled 2018-11-03: qty 2

## 2018-11-03 MED ORDER — GABAPENTIN 300 MG PO CAPS
300.0000 mg | ORAL_CAPSULE | ORAL | Status: AC
  Administered 2018-11-03: 300 mg via ORAL
  Filled 2018-11-03: qty 1

## 2018-11-03 MED ORDER — FLUTICASONE PROPIONATE 50 MCG/ACT NA SUSP
2.0000 | Freq: Every day | NASAL | Status: DC
  Administered 2018-11-04: 2 via NASAL
  Filled 2018-11-03: qty 16

## 2018-11-03 MED ORDER — SODIUM CHLORIDE 0.9 % IV SOLN: 250.0000 mL | INTRAVENOUS | Status: DC | PRN

## 2018-11-03 MED ORDER — HYDRALAZINE HCL 20 MG/ML IJ SOLN
5.0000 mg | INTRAMUSCULAR | Status: AC | PRN
  Administered 2018-11-03 (×2): 5 mg via INTRAVENOUS

## 2018-11-03 MED ORDER — ROCURONIUM BROMIDE 50 MG/5ML IV SOSY
PREFILLED_SYRINGE | INTRAVENOUS | Status: DC | PRN
  Administered 2018-11-03: 40 mg via INTRAVENOUS

## 2018-11-03 MED ORDER — GABAPENTIN 300 MG PO CAPS
300.0000 mg | ORAL_CAPSULE | Freq: Two times a day (BID) | ORAL | Status: DC
  Administered 2018-11-03 – 2018-11-04 (×3): 300 mg via ORAL
  Filled 2018-11-03 (×3): qty 1

## 2018-11-03 MED ORDER — SUCCINYLCHOLINE CHLORIDE 200 MG/10ML IV SOSY
PREFILLED_SYRINGE | INTRAVENOUS | Status: DC | PRN
  Administered 2018-11-03: 80 mg via INTRAVENOUS

## 2018-11-03 MED ORDER — PANTOPRAZOLE SODIUM 40 MG PO TBEC
40.0000 mg | DELAYED_RELEASE_TABLET | Freq: Every day | ORAL | Status: DC
  Administered 2018-11-03 – 2018-11-04 (×2): 40 mg via ORAL
  Filled 2018-11-03 (×2): qty 1

## 2018-11-03 MED ORDER — 0.9 % SODIUM CHLORIDE (POUR BTL) OPTIME
TOPICAL | Status: DC | PRN
  Administered 2018-11-03: 10:00:00 1000 mL

## 2018-11-03 MED ORDER — LACTATED RINGERS IV SOLN
INTRAVENOUS | Status: DC
  Administered 2018-11-03: 07:00:00 via INTRAVENOUS

## 2018-11-03 MED ORDER — ALVIMOPAN 12 MG PO CAPS: 12.0000 mg | ORAL_CAPSULE | Freq: Two times a day (BID) | ORAL | Status: DC

## 2018-11-03 MED ORDER — SODIUM CHLORIDE 0.9% FLUSH
3.0000 mL | Freq: Two times a day (BID) | INTRAVENOUS | Status: DC
  Administered 2018-11-03: 3 mL via INTRAVENOUS

## 2018-11-03 MED ORDER — GLIPIZIDE 5 MG PO TABS
7.5000 mg | ORAL_TABLET | Freq: Every day | ORAL | Status: DC
  Administered 2018-11-04: 7.5 mg via ORAL
  Filled 2018-11-03: qty 2

## 2018-11-03 MED ORDER — SODIUM CHLORIDE 0.9 % IV SOLN: Freq: Three times a day (TID) | INTRAVENOUS | Status: DC | PRN

## 2018-11-03 MED ORDER — LIDOCAINE HCL 2 % IJ SOLN
INTRAMUSCULAR | Status: AC
  Filled 2018-11-03: qty 20

## 2018-11-03 MED ORDER — INSULIN ASPART 100 UNIT/ML ~~LOC~~ SOLN
0.0000 [IU] | Freq: Three times a day (TID) | SUBCUTANEOUS | Status: DC
  Administered 2018-11-03: 8 [IU] via SUBCUTANEOUS
  Administered 2018-11-04: 11 [IU] via SUBCUTANEOUS
  Administered 2018-11-04: 2 [IU] via SUBCUTANEOUS

## 2018-11-03 MED ORDER — INSULIN ASPART 100 UNIT/ML ~~LOC~~ SOLN
SUBCUTANEOUS | Status: AC
  Filled 2018-11-03: qty 1

## 2018-11-03 MED ORDER — FENTANYL CITRATE (PF) 100 MCG/2ML IJ SOLN
INTRAMUSCULAR | Status: DC | PRN
  Administered 2018-11-03 (×3): 25 ug via INTRAVENOUS
  Administered 2018-11-03: 50 ug via INTRAVENOUS
  Administered 2018-11-03: 25 ug via INTRAVENOUS

## 2018-11-03 MED ORDER — FLUTICASONE FUROATE-VILANTEROL 200-25 MCG/INH IN AEPB
1.0000 | INHALATION_SPRAY | Freq: Every day | RESPIRATORY_TRACT | Status: DC
  Administered 2018-11-04: 1 via RESPIRATORY_TRACT
  Filled 2018-11-03: qty 28

## 2018-11-03 MED ORDER — LOSARTAN POTASSIUM 50 MG PO TABS
50.0000 mg | ORAL_TABLET | Freq: Every day | ORAL | Status: DC
  Administered 2018-11-03 – 2018-11-04 (×2): 50 mg via ORAL
  Filled 2018-11-03 (×2): qty 1

## 2018-11-03 MED ORDER — ACETAMINOPHEN 500 MG PO TABS
1000.0000 mg | ORAL_TABLET | Freq: Four times a day (QID) | ORAL | Status: DC
  Administered 2018-11-03 – 2018-11-04 (×5): 1000 mg via ORAL
  Filled 2018-11-03 (×5): qty 2

## 2018-11-03 MED ORDER — TRAMADOL HCL 50 MG PO TABS: 50.0000 mg | ORAL_TABLET | Freq: Four times a day (QID) | ORAL | Status: DC | PRN

## 2018-11-03 MED ORDER — HYDRALAZINE HCL 20 MG/ML IJ SOLN
10.0000 mg | INTRAMUSCULAR | Status: DC | PRN
  Administered 2018-11-03: 10 mg via INTRAVENOUS
  Filled 2018-11-03: qty 1

## 2018-11-03 MED ORDER — PROCHLORPERAZINE EDISYLATE 10 MG/2ML IJ SOLN: 5.0000 mg | Freq: Four times a day (QID) | INTRAMUSCULAR | Status: DC | PRN

## 2018-11-03 MED ORDER — ENSURE SURGERY PO LIQD
237.0000 mL | Freq: Two times a day (BID) | ORAL | Status: DC
  Administered 2018-11-04: 237 mL via ORAL
  Filled 2018-11-03 (×3): qty 237

## 2018-11-03 SURGICAL SUPPLY — 42 items
APL PRP STRL LF DISP 70% ISPRP (MISCELLANEOUS) ×1
APL SKNCLS STERI-STRIP NONHPOA (GAUZE/BANDAGES/DRESSINGS) ×1
ATTRACTOMAT 16X20 MAGNETIC DRP (DRAPES) ×3 IMPLANT
BANDAGE ADH SHEER 1  50/CT (GAUZE/BANDAGES/DRESSINGS) ×3 IMPLANT
BENZOIN TINCTURE PRP APPL 2/3 (GAUZE/BANDAGES/DRESSINGS) ×3 IMPLANT
BLADE SURG 15 STRL LF DISP TIS (BLADE) ×1 IMPLANT
BLADE SURG 15 STRL SS (BLADE) ×3
CHLORAPREP W/TINT 26 (MISCELLANEOUS) ×3 IMPLANT
CLIP VESOCCLUDE MED 6/CT (CLIP) ×9 IMPLANT
CLIP VESOCCLUDE SM WIDE 6/CT (CLIP) ×6 IMPLANT
CLOSURE WOUND 1/2 X4 (GAUZE/BANDAGES/DRESSINGS) ×1
COVER SURGICAL LIGHT HANDLE (MISCELLANEOUS) ×3 IMPLANT
COVER WAND RF STERILE (DRAPES) ×3 IMPLANT
DISSECTOR ROUND CHERRY 3/8 STR (MISCELLANEOUS) ×3 IMPLANT
DRAPE LAPAROTOMY T 98X78 PEDS (DRAPES) ×3 IMPLANT
ELECT PENCIL ROCKER SW 15FT (MISCELLANEOUS) ×3 IMPLANT
ELECT REM PT RETURN 15FT ADLT (MISCELLANEOUS) ×3 IMPLANT
GAUZE 4X4 16PLY RFD (DISPOSABLE) ×3 IMPLANT
GAUZE SPONGE 4X4 12PLY STRL (GAUZE/BANDAGES/DRESSINGS) ×3 IMPLANT
GLOVE ECLIPSE 8.0 STRL XLNG CF (GLOVE) ×3 IMPLANT
GLOVE INDICATOR 8.0 STRL GRN (GLOVE) ×3 IMPLANT
GOWN STRL REUS W/TWL XL LVL3 (GOWN DISPOSABLE) ×9 IMPLANT
HEMOSTAT SURGICEL 2X4 FIBR (HEMOSTASIS) ×3 IMPLANT
KIT BASIN OR (CUSTOM PROCEDURE TRAY) ×3 IMPLANT
KIT TURNOVER KIT A (KITS) ×3 IMPLANT
NEEDLE HYPO 22GX1.5 SAFETY (NEEDLE) ×3 IMPLANT
PACK BASIC VI WITH GOWN DISP (CUSTOM PROCEDURE TRAY) ×3 IMPLANT
SHEARS HARMONIC 9CM CVD (BLADE) ×3 IMPLANT
STAPLER VISISTAT 35W (STAPLE) IMPLANT
STRIP CLOSURE SKIN 1/2X4 (GAUZE/BANDAGES/DRESSINGS) ×2 IMPLANT
SUT MNCRL AB 4-0 PS2 18 (SUTURE) ×3 IMPLANT
SUT SILK 2 0 (SUTURE) ×3
SUT SILK 2-0 18XBRD TIE 12 (SUTURE) ×1 IMPLANT
SUT SILK 3 0 (SUTURE)
SUT SILK 3-0 18XBRD TIE 12 (SUTURE) IMPLANT
SUT VIC AB 3-0 SH 18 (SUTURE) ×6 IMPLANT
SYR 20CC LL (SYRINGE) ×3 IMPLANT
SYR BULB IRRIGATION 50ML (SYRINGE) ×3 IMPLANT
TAPE CLOTH SURG 4X10 WHT LF (GAUZE/BANDAGES/DRESSINGS) ×3 IMPLANT
TOWEL OR 17X26 10 PK STRL BLUE (TOWEL DISPOSABLE) ×3 IMPLANT
TOWEL OR NON WOVEN STRL DISP B (DISPOSABLE) ×3 IMPLANT
YANKAUER SUCT BULB TIP 10FT TU (MISCELLANEOUS) ×3 IMPLANT

## 2018-11-03 NOTE — Discharge Instructions (Signed)
[facility], [place] [phone]  THYROID/ PARATHYROID SURGERY: POST OP INSTRUCTIONS  Always review your discharge instruction sheet given to you by the facility where your surgery was performed.  IF YOU HAVE DISABILITY OR FAMILY LEAVE FORMS, YOU MUST BRING THEM TO THE OFFICE FOR PROCESSING.  PLEASE DO NOT GIVE THEM TO YOUR DOCTOR.  1. A prescription for pain medication may be given to you upon discharge.  Take your pain medication as prescribed, if needed.  If narcotic pain medicine is not needed, then you may take acetaminophen (Tylenol) or ibuprofen (Advil) as needed. 2. Take your usually prescribed medications unless otherwise directed. 3. If you need a refill on your pain medication, please contact your pharmacy. They will contact our office to request authorization.  Prescriptions will not be filled after 5pm or on week-ends. 4. You should follow a light diet the first 24 hours after arrival home, such as soup and crackers, etc.  Be sure to include lots of fluids daily.  Resume your normal diet the day after surgery. 5. Most patients will experience some swelling and bruising on the chest and neck area.  Ice packs will help.  Swelling and bruising can take several days to resolve.  6. It is common to experience some constipation if taking pain medication after surgery.  Increasing fluid intake and taking a stool softener will usually help or prevent this problem from occurring.  A mild laxative (Milk of Magnesia or Miralax) should be taken according to package directions if there are no bowel movements after 48 hours. 7. Unless discharge instructions indicate otherwise, you may remove your bandages 24-48 hours after surgery.  You may shower.  You may have steri-strips (small skin tapes) in place directly over the incision.  These strips should be left on the skin for 5-7 days.  8.  9. ACTIVITIES:  You may resume regular (light) daily activities beginning the next day--such as  daily self-care, walking, climbing stairs--gradually increasing activities as tolerated.  You may have sexual intercourse when it is comfortable.  Refrain from any heavy lifting or straining until approved by your doctor. a. You may drive when you no longer are taking prescription pain medication, you can comfortably wear a seatbelt, and you can safely maneuver your car and apply brakes b. RETURN TO WORK:  __________________________________________________________ 10. You should see your doctor in the office for a follow-up appointment approximately two weeks after your surgery.  Make sure that you call for this appointment within a day or two after you arrive home to insure a convenient appointment time. 11. OTHER INSTRUCTIONS: ____________________________________________________________________________ _________________________________________________________________________________________________________________ _________________________________________________________________________________________________________________   WHEN TO CALL YOUR DOCTOR: 1. Fever over 101.0 2. Inability to urinate 3. Nausea and/or vomiting 4. Extreme swelling or bruising 5. Continued bleeding from incision. 6. Increased pain, redness, or drainage from the incision. 7. Difficulty swallowing or breathing 8. Muscle cramping or spasms. 9. Numbness or tingling in hands or feet or around lips.  The clinic staff is available to answer your questions during regular business hours.  Please dont hesitate to call and ask to speak to one of the nurses if you have concerns.  For further questions, please visit [email or web address]

## 2018-11-03 NOTE — Progress Notes (Addendum)
11/03/2018 12:21 PM   Victoria Vasquez  [birth year] [id number]  Patient Care Team: Kathyrn Drown, MD as PCP - General (Family Medicine) Danie Binder, MD (Gastroenterology) Lattie Haw Cristopher Estimable, MD as Attending Physician (Cardiology) Michael Boston, MD as Consulting Physician (General Surgery) Cassandria Anger, MD as Consulting Physician (Endocrinology)   Patient alert in recovery room sitting up.  A little mild posterior neck soreness corrected with letting her neck extend & lowering HOB to 30 degrees (history of moderate kyphosis).  Minimal hoarseness but good strength which is her baseline.  No stridor.  No more cough.  Talking normally.  No conversational dyspnea.  Pain controlled.  Dressing clean.  PACU RN at bedside  BP (!) 197/88   Pulse 77   Temp 97.6 F (36.4 C)   Resp 14   SpO2 99%   Patient Active Problem List   Diagnosis Date Noted  . 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 Multinodular goiter 06/17/2018  . Paget's disease and intraductal carcinoma of breast, left ([place]) 03/18/2018  . Primary osteoarthritis of right knee 05/29/2016  . Glaucoma 07/05/2015  . Diabetic nephropathy with proteinuria ([place]) 09/04/2014  . Macular degeneration 08/30/2014  . Cough 05/01/2014  . Somnolence 10/12/2012  . Hypokalemia 09/29/2012  . Syncope 09/14/2012  . Dyspnea on exertion 09/08/2012  . Uncontrolled type 2 diabetes mellitus with stage 3 chronic kidney disease ([place])   . Hypertension   . Hyperlipidemia   . Chest pain   . GERD (gastroesophageal reflux disease) 02/11/2012    Past Medical History:  Diagnosis Date  . Arthritis   . Asthma   . Cancer ([place])    BREAST CANCER LEFT BREAST  . Chest pain    Evaluation prior to 2003 reportedly including cath-records never located; Echo in 2009: Mild LVH, LAE2, nl EF, moderate LAE, MR1-2  . Chronic kidney disease   . COPD (chronic obstructive pulmonary disease) ([facility])    Exertional dyspnea;  PFTs in 2002: Moderate small airway obstruction, mildly reduced lung volumes and DLCO, nl ABG  . Diabetes mellitus, type II ([place])   . Dyspnea   . Family history of adverse reaction to anesthesia    daugher has postop N&V  . Fatigue   . GERD (gastroesophageal reflux disease)   . Hyperlipidemia   . Hypertension   . Legally blind 1 1 2015  . Macular degeneration of both eyes 1 1 2015  . Parotid mass 2003   Right-2003  . Scoliosis   . Shingles     Past Surgical History:  Procedure Laterality Date  . APPENDECTOMY  1951  . BREAST BIOPSY Left 10/05/2017   Procedure: BREAST BIOPSY;  Surgeon: Aviva Signs, MD;  Location: [facility];  Service: General;  Laterality: Left;  . BREAST LUMPECTOMY Left 03/18/2018  . BREAST LUMPECTOMY Left 03/18/2018   Procedure: LEFT BREAST CENTRAL  LUMPECTOMY;  Surgeon: Rolm Bookbinder, MD;  Location: [facility];  Service: General;  Laterality: Left;  . CARDIAC CATHETERIZATION  1999  . CHOLECYSTECTOMY  2003  . COLONOSCOPY  04/16/2006   [place]-Normal colon.  Exam limited due to retained particulate matter in the lumen of the colon, polyps less than 1 cm would have been easily missed.  Otherwise, no polyps, masses, inflammatory changes or vascular ectasia seen/ Normal retroflexed view of the rectum..  recommend repeat exam at 03/2011  . ORIF  1996  . ORIF ANKLE FRACTURE  1992    Social History   Socioeconomic History  .  Marital status: Widowed    Spouse name: Not on file  . Number of children: 3  . Years of education: Not on file  . Highest education level: Not on file  Occupational History  . Occupation: Retired  Scientific laboratory technician  . Financial resource strain: Not on file  . Food insecurity    Worry: Not on file    Inability: Not on file  . Transportation needs    Medical: Not on file    Non-medical: Not on file  Tobacco Use  . Smoking status: Passive Smoke Exposure - Never Smoker  . Smokeless tobacco: Never Used  Substance and Sexual Activity  . Alcohol  use: No  . Drug use: No  . Sexual activity: Not on file  Lifestyle  . Physical activity    Days per week: Not on file    Minutes per session: Not on file  . Stress: Not on file  Relationships  . Social Herbalist on phone: Not on file    Gets together: Not on file    Attends religious service: Not on file    Active member of club or organization: Not on file    Attends meetings of clubs or organizations: Not on file    Relationship status: Not on file  . Intimate partner violence    Fear of current or ex partner: Not on file    Emotionally abused: Not on file    Physically abused: Not on file    Forced sexual activity: Not on file  Other Topics Concern  . Not on file  Social History Narrative   Widowed   3 children   No regular exercise    Family History  Problem Relation Age of Onset  . Stomach cancer Mother   . Alzheimer's disease Sister   . Colon cancer Neg Hx     Medications Prior to Admission  Medication Sig Dispense Refill Last Dose  . acetaminophen (TYLENOL) 500 MG tablet Take 500 mg by mouth every 6 (six) hours as needed for moderate pain or headache.   11/02/2018 at Unknown time  . albuterol (PROVENTIL HFA;VENTOLIN HFA) 108 (90 Base) MCG/ACT inhaler Use 2 puffs every 4 hours as needed for shortness of breath/wheezing (Patient taking differently: Inhale 2 puffs into the lungs every 4 (four) hours as needed for wheezing or shortness of breath. ) 1 Inhaler 0 11/03/2018 at 0600  . aspirin 81 MG tablet Take 81 mg by mouth daily.   11/02/2018 at Unknown time  . budesonide-formoterol (SYMBICORT) 160-4.5 MCG/ACT inhaler USE 2 PUFFS TWICE DAILY FOR ASTHMA. RINSE MOUTH AFTER USE. (Patient taking differently: Inhale 2 puffs into the lungs 2 (two) times daily. ) 10.2 g 5 11/03/2018 at 0600  . glipiZIDE (GLUCOTROL) 5 MG tablet Take 0.5-1.5 tablets (2.5-7.5 mg total) by mouth See admin instructions. Take 7.5 mg by mouth in the morning and take 2.5 mg by mouth in the evening  60 tablet 0 11/02/2018 at Unknown time  . latanoprost (XALATAN) 0.005 % ophthalmic solution Place 1 drop into both eyes at bedtime.    11/02/2018 at Unknown time  . losartan (COZAAR) 50 MG tablet TAKE 1 TABLET BY MOUTH ONCE A DAY. 30 tablet 0 11/02/2018 at Unknown time  . Multiple Vitamins-Minerals (PRESERVISION AREDS 2+MULTI VIT PO) Take 1 capsule by mouth 2 (two) times daily.    11/02/2018 at Unknown time  . pantoprazole (PROTONIX) 40 MG tablet TAKE 1 TABLET BY MOUTH ONCE DAILY. (Patient taking differently:  Take 40 mg by mouth daily. ) 90 tablet 1 11/02/2018 at Unknown time  . fluticasone (FLONASE) 50 MCG/ACT nasal spray Place 2 sprays into both nostrils daily. (Patient taking differently: Place 2 sprays into both nostrils daily as needed for allergies. ) 16 g 6 Unknown at Unknown time    Current Facility-Administered Medications  Medication Dose Route Frequency Provider Last Rate Last Dose  . acetaminophen (TYLENOL) tablet 1,000 mg  1,000 mg Oral Once Roderic Palau, MD      . fentaNYL (SUBLIMAZE) 100 MCG/2ML injection           . fentaNYL (SUBLIMAZE) injection 25-50 mcg  25-50 mcg Intravenous Q5 min PRN Roderic Palau, MD   50 mcg at 11/03/18 1156  . hydrALAZINE (APRESOLINE) 20 MG/ML injection           . insulin aspart (novoLOG) 100 UNIT/ML injection           . lactated ringers infusion   Intravenous Continuous Roderic Palau, MD 50 mL/hr at 11/03/18 [phone]    . lip balm (CARMEX) ointment              Allergies  Allergen Reactions  . Lisinopril Cough  . Statins Other (See Comments)    Myalgias    BP (!) 197/88   Pulse 77   Temp 97.6 F (36.4 C)   Resp 14   SpO2 99%   Labs: Results for orders placed or performed during the hospital encounter of 11/03/18 (from the past 48 hour(s))  Glucose, capillary     Status: Abnormal   Collection Time: 11/03/18  6:49 AM  Result Value Ref Range   Glucose-Capillary 138 (H) 70 - 99 mg/dL  Glucose, capillary     Status: Abnormal    Collection Time: 11/03/18 10:55 AM  Result Value Ref Range   Glucose-Capillary 216 (H) 70 - 99 mg/dL  Glucose, capillary     Status: Abnormal   Collection Time: 11/03/18 12:01 PM  Result Value Ref Range   Glucose-Capillary 241 (H) 70 - 99 mg/dL  Glucose, capillary     Status: Abnormal   Collection Time: 11/03/18 12:04 PM  Result Value Ref Range   Glucose-Capillary 243 (H) 70 - 99 mg/dL    Imaging / Studies: No results found.   Adin Hector, M.D., F.A.C.S. Gastrointestinal and Minimally Invasive Surgery [facility] [address] [place], [place] [address] [phone] Main / Paging  11/03/2018 12:20 PM

## 2018-11-03 NOTE — Anesthesia Procedure Notes (Signed)
Procedure Name: Intubation Date/Time: 11/03/2018 8:42 AM Performed by: Maxwell Caul, CRNA Pre-anesthesia Checklist: Patient identified, Emergency Drugs available, Suction available and Patient being monitored Patient Re-evaluated:Patient Re-evaluated prior to induction Oxygen Delivery Method: Circle system utilized Preoxygenation: Pre-oxygenation with 100% oxygen Induction Type: IV induction Laryngoscope Size: Mac and 3 Grade View: Grade I Tube type: Oral Tube size: 7.0 mm Number of attempts: 1 Airway Equipment and Method: Stylet Placement Confirmation: ETT inserted through vocal cords under direct vision,  positive ETCO2 and breath sounds checked- equal and bilateral Secured at: 21 cm Tube secured with: Tape Dental Injury: Teeth and Oropharynx as per pre-operative assessment

## 2018-11-03 NOTE — Anesthesia Postprocedure Evaluation (Signed)
Anesthesia Post Note  Patient: Victoria Vasquez  Procedure(s) Performed: TOTAL THYROIDECTOMY (N/A Neck)     Patient location during evaluation: PACU Anesthesia Type: General Level of consciousness: awake and alert Pain management: pain level controlled Vital Signs Assessment: post-procedure vital signs reviewed and stable Respiratory status: spontaneous breathing, nonlabored ventilation, respiratory function stable and patient connected to nasal cannula oxygen Cardiovascular status: blood pressure returned to baseline and stable Postop Assessment: no apparent nausea or vomiting Anesthetic complications: no    Last Vitals:  Vitals:   11/03/18 1200 11/03/18 1215  BP: (!) 197/88 (!) 179/86  Pulse: 77 74  Resp: 14 14  Temp:    SpO2: 99% 99%    Last Pain:  Vitals:   11/03/18 1215  TempSrc:   PainSc: 4                  Ileana Chalupa,W. EDMOND

## 2018-11-03 NOTE — Interval H&P Note (Signed)
History and Physical Interval Note:  11/03/2018 7:13 AM  Victoria Vasquez  has presented today for surgery, with the diagnosis of papillary cancer of Isthmus of thyroid, thyroid nodules, multinodular goiter.  The various methods of treatment have been discussed with the patient and family. After consideration of risks, benefits and other options for treatment, the patient has consented to  Procedure(s): TOTAL THYROIDECTOMY (N/A) as a surgical intervention.  The patient's history has been reviewed, patient examined, no change in status, stable for surgery.  I have reviewed the patient's chart and labs.  Questions were answered to the patient's satisfaction.     Adin Hector

## 2018-11-03 NOTE — Op Note (Signed)
11/03/2018  10:35 AM  PATIENT:  Victoria Vasquez  85 y.o. female  Patient Care Team: Kathyrn Drown, MD as PCP - General (Family Medicine) Danie Binder, MD (Gastroenterology) Lattie Haw, Cristopher Estimable, MD as Attending Physician (Cardiology) Michael Boston, MD as Consulting Physician (General Surgery)  PRE-OPERATIVE DIAGNOSIS:  Papillary cancer of Isthmus of thyroid, thyroid nodules, multinodular goiter with globus  POST-OPERATIVE DIAGNOSIS:  Papillary cancer of Isthmus of thyroid, thyroid nodules, multinodular goiter with globus  PROCEDURE: TOTAL THYROIDECTOMY  SURGEON:  Adin Hector, MD  ASSISTANT: Christie Beckers, MD   ANESTHESIA:   regional and general  EBL:  Total I/O In: -  Out: 100 [Blood:100]  Delay start of Pharmacological VTE agent (>24hrs) due to surgical blood loss or risk of bleeding:  no  DRAINS: none   SPECIMEN: Thyroid gland with dominant large mass of isthmus.  Suture marks left superior lobe  DISPOSITION OF SPECIMEN:  PATHOLOGY  COUNTS:  YES  PLAN OF CARE: Admit for overnight observation  PATIENT DISPOSITION:  PACU - hemodynamically stable.  INDICATION: Pleasant elderly woman with enlarging anterior neck mass.  Mass found in the isthmus of the thyroid.  Biopsy consistent with papillary cancer.  Concern for multinodular goiter as well.  Patient with increasing globus symptoms with scratchy throat and mild choking sensation with swallowing.  Recommendation made for surgical resection by thyroidectomy.  The anatomy and physiology of the thyroid gland and organs of the neck were discussed.  Pathophysiology of thyroid problems were discussed.  Options were discussed, and I made a recommendation to remove part (and possibly all) of the thyroid gland to treat the pathology.    Risks of bleeding, infection, injury to other organs including nerves, reoperation, death, and other risks were discussed.   I noted a good likelihood this will help address the problem.  While  there are risks, I feel the risks of nonoperative management are greater; therefore, I feel surgery offers the best option. Educational material was given to help further explain the topics & concerns from our discussion.  We will work to minimize complications.     OR FINDINGS: Large bulky golf ball sized tumor of the central part of the thyroid gland replacing the isthmus.  Very hard and rocky with inflammatory adhesions to strap muscles as well as thyrohyoid muscles overlying thyroid and cricoid anterior cartilage.  DESCRIPTION:   The patient was brought to the operating room and placed in a supine position on the operating room table. Following administration of general anesthesia, the patient was positioned and then prepped and draped in the usual aseptic fashion. After ascertaining that an adequate level of anesthesia had been achieved, a Kocher incision transversely across the anterior neck was made with #15 blade. Dissection was carried through subcutaneous tissues and platysma. Hemostasis was achieved with the electrocautery as well as clips for dominant veins along the strap muscles. Skin flaps were elevated cephalad and caudad from the thyroid notch to the sternal notch. A self-retaining retractor was placed for exposure. Strap muscles were incised in the midline. Strap muscles were already stretched laterally by the central firm spherical mass.  Reflected laterally.   The thyroid gland was inspected.  There was a golf ball size rocky mass central.  I began dissection on the left side.  Freed the strap muscles off the anterior central mass and worked to get to the much smaller normal size left thyroid lobe. The left lobe was gently mobilized with blunt dissection and occasional force  against harmonic scalpel dissection staying close to the capsule of the gland. Superior pole vessels were dissected out and divided individually between medium Ligaclips with the Harmonic scalpel. The  thyroid lobe was rolled anteriorly. Branches of the inferior thyroid artery were divided between small Ligaclips with the Harmonic scalpel. Inferior venous tributaries were divided between Ligaclips. Both the superior and inferior parathyroid glands were identified and preserved on their vascular pedicles. The recurrent laryngeal nerve was identified and preserved along its course.   The ligament of Gwenlyn Found was released with harmonic scalpel.  Also to care to free off the thyroid and cricoid anteriorly taking some thyrohyoid muscle en bloc.  The gland was mobilized onto the anterior trachea. Isthmus was mobilized across the midline doing a moderate dissection.  There was a dominant right superomedial pyramidal lobe present. This was kept in place as dissection was turned to mobilize the right thyroid lobe.  Dissection done in a similar, mirror-image fashion.  Superior parathyroid seen and preserved.  Inferior parathyroid partially preserved due to some inflammatory adhesions nearby.  Thyroid artery closely ligated close to the thyroid gland.  Laryngeal nerve seen in its natural course and left undissected.  With the lobe mobilized and transected the pyramidal lobe off the thyroid and cricoid cartilage taking some straps of muscle as well.  With that the entire thyroid gland with the dominant central mass was removed en bloc.  Right inferior cluster of fat and lymph nodes carefully skeletonized and removed.  Left thyroid lobe superior pole marked with a looped Vicryl suture & submitted to pathology for review.  The neck was irrigated with warm saline. We assured hemostasis.  Fibular was placed throughout the operative field. Strap muscles were reapproximated in the midline with interrupted Vicryl sutures. Platysma was closed with interrupted Vicryl sutures. Skin was closed with a running 4-0 Monocryl subcuticular suture. Wound was washed and dried and benzoin and steri-strips were applied. Dry gauze  dressing was placed. The patient was awakened from anesthesia and brought to the recovery room. The patient tolerated the procedure well.  I updated the status of the patient to the patient's daughter, Donne Anon.  I made recommendations.  I answered questions.  Understanding & appreciation was expressed.   Adin Hector, M.D., F.A.C.S. Gastrointestinal and Minimally Invasive Surgery [facility] [address] [place], [place] [address] [phone] Main / Paging

## 2018-11-03 NOTE — Transfer of Care (Signed)
Immediate Anesthesia Transfer of Care Note  Patient: SHELSY SENG  Procedure(s) Performed: TOTAL THYROIDECTOMY (N/A Neck)  Patient Location: PACU  Anesthesia Type:General  Level of Consciousness: awake, alert  and oriented  Airway & Oxygen Therapy: Patient Spontanous Breathing and Patient connected to face mask oxygen  Post-op Assessment: Report given to RN and Post -op Vital signs reviewed and stable  Post vital signs: Reviewed and stable  Last Vitals:  Vitals Value Taken Time  BP    Temp    Pulse 74 11/03/18 1046  Resp    SpO2 100 % 11/03/18 1046  Vitals shown include unvalidated device data.  Last Pain:  Vitals:   11/03/18 0644  TempSrc: Oral         Complications: No apparent anesthesia complications

## 2018-11-03 NOTE — Progress Notes (Signed)
Pt  Transported to room with 1 bag.  Pt's daughter updated with status,  room number and phone number

## 2018-11-03 NOTE — H&P (Signed)
Victoria Vasquez  DOB: 1927/09/19  Patient Care Team: Kathyrn Drown, MD as PCP - General (Family Medicine) Danie Binder, MD (Gastroenterology) Lattie Haw Cristopher Estimable, MD as Attending Physician (Cardiology)  ` ` Patient sent for surgical consultation at the request of Dr Dorris Fetch  Chief Complaint: Papillary thyroid cancer ` ` The patient is a pleasant elderly woman with multinodular goiter. She is having worsening symptoms of globus with feeling fullness with swallowing and not able to lie supine. Ultrasound revealed a 4 cm isthmus mass and 2 cm left thyroid lobe mass. Biopsy of this with mesh as papillary cancer. Surgical consultation requested. Patient comes in today with her daughter. Patient had a modified barium swallow. No frank aspiration been swallowing especially 2 pills was somewhat slowed and delayed with some esophageal dysmotility. Patient evaluated by endocrinology Dr. Lum Babe. Felt she would benefit from surgery. Most likely follow-up to start thyroid hormone replacement postoperatively. Patient denies any hoarseness. She tends to feel a little bit tired. Daughter says she said that for 20 years. She is a diabetic on oral hypoglycemics only. She does not smoke. She moves her bowels every day. She's had have breast surgery the past few years. No major bleeding issues. No problems with anesthesia.  No new events.  Ready for surgery  (Review of systems as stated in this history (HPI) or in the review of systems. Otherwise all other 12 point ROS are negative) ` ` `  Adequacy Reason Satisfactory For Evaluation. Diagnosis THYROID, FINE NEEDLE ASPIRATION, ISTHMUS (SPECIMEN 1 OF 1 COLLECTED 07/01/18): - FINDINGS CONSISTENT WITH PAPILLARY CARCINOMA ([place] CATEGORY VI) DAWN BUTLER MD Pathologist, Electronic Signature (Case signed 07/02/2018) Source Thyroid, Fine Needle Aspiration, Isthmus (specimen 1 of 1 collected 07/01/18) Ari Bernabei Specimen: Received is/are 3  slides in 95% Ethyl alcohol, 3 slides for diff stain, and 30 ccs of light pink Cytolyt solution. (CM:cm) Prepared: # Smears: 6 # Concentration Technique Slides (i.e. ThinPrep): 1 # Cell Block: Cell block attempted, not obtained. Additional Studies: n/a Comment Dr. Tresa Moore reviewed the case and agrees with the above diagnosis. Dr. Wolfgang Phoenix was notified of these results on July 02, 2018. Report signed out from the following location(s) Technical component and interpretation was performed at [facility] [place], [place], [place] [address]. CLIA #: [id number], 1 of   Allergies Emeline Gins, [place]; 07/21/2018 11:21 AM) No Known Drug Allergies [03/09/2018]: Allergies Reconciled  Medication History Emeline Gins, CMA; 07/21/2018 11:22 AM) Losartan Potassium (50MG  Tablet, Oral) Active. Latanoprost (0.005% Solution, Ophthalmic) Active. Pantoprazole Sodium (40MG  Tablet DR, Oral) Active. Symbicort (160-4.5MCG/ACT Aerosol, Inhalation) Active. Aspirin (81MG  Tablet DR, Oral) Active. glipiZIDE (5MG  Tablet, Oral) Active. Tylenol (325MG  Tablet, Oral) Active. ZyrTEC Allergy (10MG  Tablet, Oral) Active. OneTouch Ultra Blue (In Vitro) Active. Timolol Maleate (0.5% Solution, Ophthalmic) Active. Medications Reconciled    Vitals Emeline Gins CMA; 07/21/2018 11:21 AM) 07/21/2018 11:20 AM Weight: 179.4 lb Height: 63in Body Surface Area: 1.85 m Body Mass Index: 31.78 kg/m  Temp.: 97.57F  Pulse: 87 (Regular)  BP: 158/86 (Sitting, Left Arm, Standard)   BP (!) 202/87   Pulse 65   Temp 98.1 F (36.7 C) (Oral)   Resp 16   SpO2 98%     Physical Exam Adin Hector MD; 07/21/2018 11:58 AM)  General Mental Status-Alert. General Appearance-Not in acute distress, Not Sickly. Orientation-Oriented X3. Hydration-Well hydrated. Voice-Normal.  Integumentary Global Assessment Upon inspection and palpation of skin surfaces  of the - Axillae: non-tender, no inflammation or ulceration, no drainage.  and Distribution of scalp and body hair is normal. General Characteristics Temperature - normal warmth is noted.  Head and Neck Head-normocephalic, atraumatic with no lesions or palpable masses. Face Global Assessment - atraumatic, no absence of expression. Neck Global Assessment - no abnormal movements, no bruit auscultated on the right, no bruit auscultated on the left, no decreased range of motion, non-tender. Trachea-midline. Thyroid Gland Characteristics - non-tender. Note: Midline fullness and nodularity at the midline of the neck moving with swallowing consistent with isthmus mass. Mild fullness on left thyroid lobe greater than right.  Eye Eyeball - Left-Extraocular movements intact, No Nystagmus. Eyeball - Right-Extraocular movements intact, No Nystagmus. Cornea - Left-No Hazy. Cornea - Right-No Hazy. Sclera/Conjunctiva - Left-No scleral icterus, No Discharge. Sclera/Conjunctiva - Right-No scleral icterus, No Discharge. Pupil - Left-Direct reaction to light normal. Pupil - Right-Direct reaction to light normal. Note: Wears glasses. Vision corrected  ENMT Ears Pinna - Left - no drainage observed, no generalized tenderness observed. Right - no drainage observed, no generalized tenderness observed. Nose and Sinuses External Inspection of the Nose - no destructive lesion observed. Inspection of the nares - Left - quiet respiration. Right - quiet respiration. Mouth and Throat Lips - Upper Lip - no fissures observed, no pallor noted. Lower Lip - no fissures observed, no pallor noted. Nasopharynx - no discharge present. Oral Cavity/Oropharynx - Tongue - no dryness observed. Oral Mucosa - no cyanosis observed. Hypopharynx - no evidence of airway distress observed.  Chest and Lung Exam Inspection Movements - Normal and Symmetrical. Accessory muscles - No use of accessory muscles in  breathing. Palpation Palpation of the chest reveals - Non-tender. Auscultation Breath sounds - Normal and Clear.  Cardiovascular Auscultation Rhythm - Regular. Murmurs & Other Heart Sounds - Auscultation of the heart reveals - No Murmurs and No Systolic Clicks.  Abdomen Inspection Inspection of the abdomen reveals - No Visible peristalsis and No Abnormal pulsations. Umbilicus - No Bleeding, No Urine drainage. Palpation/Percussion Palpation and Percussion of the abdomen reveal - Soft, Non Tender, No Rebound tenderness, No Rigidity (guarding) and No Cutaneous hyperesthesia. Note: Abdomen soft. Not severely distended. No distasis recti. No umbilical or other anterior abdominal wall hernias  Female Genitourinary Sexual Maturity Tanner 5 - Adult hair pattern. Note: No vaginal bleeding nor discharge  Peripheral Vascular Upper Extremity Inspection - Left - No Cyanotic nailbeds, Not Ischemic. Right - No Cyanotic nailbeds, Not Ischemic.  Neurologic Neurologic evaluation reveals -normal attention span and ability to concentrate, able to name objects and repeat phrases. Appropriate fund of knowledge , normal sensation and normal coordination. Mental Status Affect - not angry, not paranoid. Cranial Nerves-Normal Bilaterally. Gait-Normal.  Neuropsychiatric Mental status exam performed with findings of-able to articulate well with normal speech/language, rate, volume and coherence, thought content normal with ability to perform basic computations and apply abstract reasoning and no evidence of hallucinations, delusions, obsessions or homicidal/suicidal ideation.  Musculoskeletal Global Assessment Spine, Ribs and Pelvis - no instability, subluxation or laxity. Right Upper Extremity - no instability, subluxation or laxity. Note: Moderate kyphosis and scoliosis.  Lymphatic Head & Neck  General Head & Neck Lymphatics: Bilateral - Description - No Localized  lymphadenopathy. Axillary  General Axillary Region: Bilateral - Description - No Localized lymphadenopathy. Femoral & Inguinal  Generalized Femoral & Inguinal Lymphatics: Left - Description - No Localized lymphadenopathy. Right - Description - No Localized lymphadenopathy.    Assessment & Plan Adin Hector MD; 07/21/2018 12:08 PM)  CANCER OF ISTHMUS (C54.0) Impression: Multinodular goiter left  thyroid lobe and isthmus. Largest nodule 4.6 cm at isthmus. Biopsy consistent with papillary cancer.  She would benefit from thyroidectomy. Given her multinodular goiter with globus and a central cancer, believe she required total thyroidectomy. Usually overnight stay.   The anatomy and physiology of the thyroid gland and organs of the neck were discussed. Pathophysiology of thyroid problems were discussed. Options were discussed, and I made a recommendation to remove part (and possibly all) of the thyroid gland to treat the pathology.  Risks of bleeding, infection, injury to other organs including nerves, hoarseness, difficulty breathing, reoperation, death, and other risks were discussed. I noted a good likelihood this will help address the problem. While there are risks, I feel the risks of nonoperative management are greater; therefore, I feel surgery offers the best option. Educational material was given to help further explain the topics & concerns from our discussion. We will work to minimize complications.  Pt Education - Pamphlet Given - The Thyroid Book: discussed with patient and provided information. Pt Education - CCS Thyroid/ Parathyroid HCI  LEFT THYROID NODULE (E04.1) Impression: Smaller left-sided nodule. We'll remove at the time of total thyroidectomy.   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 No major swallowing  dysfunction by modified barium swallow. Hopefully will gradually resolve after thyroidectomy. Follow.  Adin Hector, MD, FACS, MASCRS Gastrointestinal and Minimally Invasive Surgery    [address] [place], [place] [address] [phone] Main / Paging [phone] Fax

## 2018-11-04 ENCOUNTER — Encounter (HOSPITAL_COMMUNITY): Payer: Self-pay | Admitting: Surgery

## 2018-11-04 DIAGNOSIS — C73 Malignant neoplasm of thyroid gland: Secondary | ICD-10-CM | POA: Diagnosis not present

## 2018-11-04 LAB — CBC
HCT: 34.5 % — ABNORMAL LOW (ref 36.0–46.0)
Hemoglobin: 11.1 g/dL — ABNORMAL LOW (ref 12.0–15.0)
MCH: 30.1 pg (ref 26.0–34.0)
MCHC: 32.2 g/dL (ref 30.0–36.0)
MCV: 93.5 fL (ref 80.0–100.0)
Platelets: 139 10*3/uL — ABNORMAL LOW (ref 150–400)
RBC: 3.69 MIL/uL — ABNORMAL LOW (ref 3.87–5.11)
RDW: 14 % (ref 11.5–15.5)
WBC: 15 10*3/uL — ABNORMAL HIGH (ref 4.0–10.5)
nRBC: 0 % (ref 0.0–0.2)

## 2018-11-04 LAB — BASIC METABOLIC PANEL
Anion gap: 11 (ref 5–15)
BUN: 27 mg/dL — ABNORMAL HIGH (ref 8–23)
CO2: 24 mmol/L (ref 22–32)
Calcium: 8.6 mg/dL — ABNORMAL LOW (ref 8.9–10.3)
Chloride: 104 mmol/L (ref 98–111)
Creatinine, Ser: 1.37 mg/dL — ABNORMAL HIGH (ref 0.44–1.00)
GFR calc Af Amer: 40 mL/min — ABNORMAL LOW (ref >60)
GFR calc non Af Amer: 34 mL/min — ABNORMAL LOW (ref >60)
Glucose, Bld: 156 mg/dL — ABNORMAL HIGH (ref 70–99)
Potassium: 4.2 mmol/L (ref 3.5–5.1)
Sodium: 139 mmol/L (ref 135–145)

## 2018-11-04 LAB — GLUCOSE, CAPILLARY
Glucose-Capillary: 135 mg/dL — ABNORMAL HIGH (ref 70–99)
Glucose-Capillary: 313 mg/dL — ABNORMAL HIGH (ref 70–99)

## 2018-11-04 LAB — MAGNESIUM: Magnesium: 2.1 mg/dL (ref 1.7–2.4)

## 2018-11-04 NOTE — Plan of Care (Signed)
Pt was discharged home today. Instructions were reviewed with patient, and questions were answered. Pt was taken to main entrance via wheelchair by NT.  

## 2018-11-04 NOTE — TOC Transition Note (Signed)
Transition of Care [facility]) - CM/SW Discharge Note   Patient Details  Name: Victoria Vasquez MRN: [id number] Date of Birth: 29-Oct-1927  [facility] of [facility] CM/SW Contact:  Leeroy Cha, RN Phone Number: 11/04/2018, 1:44 PM   Clinical Narrative:    Home with rolling walker   Final next level of care: Home/Self Care Barriers to Discharge: No Barriers Identified   Patient Goals and CMS Choice Patient states their goals for this hospitalization and ongoing recovery are:: to go home CMS Medicare.gov Compare Post Acute Care list provided to:: Patient    Discharge Placement                       Discharge Plan and Services   Discharge Planning Services: CM Consult Post Acute Care Choice: Durable Medical Equipment          DME Arranged: Walker rolling DME Agency: [employer] Date DME Agency Contacted: 11/04/18 Time DME Agency Contacted: [phone] Representative spoke with at DME Agency: Zack Blink            Social Determinants of Health ([place]) Interventions     Readmission Risk Interventions No flowsheet data found.

## 2018-11-04 NOTE — Progress Notes (Signed)
Pt s/p total thyroidectomy 11/03/2018   Papillary CA x2.  Largest pT3a, pNX.  Margins negative  Diagnosis Thyroid, thyroidectomy - PAPILLARY THYROID CARCINOMA, CLASSIC VARIANT, 4.8 CM - PAPILLARY THYROID CARCINOMA, FOLLICULAR VARIANT, 0.7 CM - BENIGN PARATHYROID GLAND - LYMPHOCYTIC THYROIDITIS - SEE ONCOLOGY TABLE AND COMMENT BELOW Microscopic Comment THYROID GLAND: Procedure: Total thyroidectomy Tumor Focality: Multifocal Tumor Site: Isthmus (dominant nodule), right lobe Tumor Size: 4.8 cm Histologic Type: Papillary carcinoma, classic (dominant nodule) Margins: Uninvolved by carcinoma Angioinvasion: Not identified Lymphatic Invasion: Not identified Extrathyroidal: Not identified; see comment Regional Lymph Nodes: No lymph nodes submitted or found Pathologic Stage Classification (pTNM, AJCC 8th Edition): pT3a, pNX Representative tumor block: 1B Comment(s): There is skeletal muscle present; however, there is no microscopic evidence of muscle invasion. There does appear to be some adipose tissue focally around tumor which raises the possibility of extra-thyroidal extension. Drs. Canacci and Kashikar reviewed the case and agree with the above diagnosis. (v1.0.0.1) Thressa Sheller MD Pathologist, Electronic Signature (Case signed 11/04/2018)

## 2018-11-04 NOTE — Discharge Summary (Signed)
Physician Discharge Summary    Patient ID: Victoria Vasquez MRN: [id number] DOB/AGE: [birth year]  85 y.o.  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  Postoperatively, the patient gradually mobilized and advanced to a solid diet.  Pain and other symptoms were treated aggressively.    By the time of discharge, the patient was walking well the hallways, eating food, having flatus.  Pain was well-controlled on an oral medications.  Based on meeting discharge criteria and continuing to recover, I felt it was safe for the patient to be discharged from the hospital to further recover with close followup. Postoperative recommendations were discussed in detail.  They are written as well.  Discharged Condition: good  Discharge Exam: Blood pressure (!) 141/64, pulse 70, temperature 98.3 F (36.8 C), temperature source Oral, resp. rate 18, SpO2 97 %.  General: Pt awake/alert/oriented x4 in No acute distress Eyes: PERRL, normal EOM.  Sclera clear.  No icterus Neuro: CN II-XII intact w/o focal sensory/motor deficits. Lymph: No head/neck/groin  lymphadenopathy Psych:  No delerium/psychosis/paranoia HENT: Normocephalic, Mucus membranes moist.  No thrush.  No hoarseness.  No cough.  No conversational dyspnea.  Good strength of voice.  Neck: Supple, No tracheal deviation.  Dressing clean.  Removed.  Steri-Strips in place.  Minimal ecchymosis above incision.  Normal healing ridge.  Chest: No chest wall pain w good excursion CV:  Pulses intact.  Regular rhythm MS: Normal AROM mjr joints.  No obvious deformity Abdomen: Soft.  Nondistended.  Nontender.  No evidence of peritonitis.  No incarcerated hernias. Ext:  SCDs BLE.  No mjr edema.  No cyanosis Skin: No petechiae / purpura   Disposition:   Follow-up Information    Michael Boston, MD. Schedule an appointment as soon as possible for a visit in 3 weeks.   Specialty: General Surgery Why: To follow up after your operation Contact information: [place] [address] [phone]           Discharge disposition: 01-Home or Self Care       Discharge Instructions    Call MD for:   Complete by: As directed    FEVER > 101.5 F  (temperatures < 101.5 F are not significant)   Call MD for:  extreme fatigue   Complete by: As directed    Call MD for:  persistant dizziness or light-headedness   Complete by: As directed    Call MD for:  persistant nausea and vomiting   Complete by: As directed    Call MD for:  redness, tenderness, or signs of infection (pain, swelling, redness, odor or green/yellow discharge around incision site)   Complete by: As directed    Call MD  for:  severe uncontrolled pain   Complete by: As directed    Diet - low sodium heart healthy   Complete by: As directed    Start with a bland diet such as soups, liquids, starchy foods, low fat foods, etc. the first few days at home. Gradually advance to a solid, low-fat, high fiber diet by the end of the first week at home.   Add a fiber supplement to your diet (Metamucil, etc) If  you feel full, bloated, or constipated, stay on a full liquid or pureed/blenderized diet for a few days until you feel better and are no longer constipated.   Discharge instructions   Complete by: As directed    See Discharge Instructions If you are not getting better after two weeks or are noticing you are getting worse, contact our office [phone] for further advice.  We may need to adjust your medications, re-evaluate you in the office, send you to the emergency room, or see what other things we can do to help. The clinic staff is available to answer your questions during regular business hours (8:30am-5pm).  Please don't hesitate to call and ask to speak to one of our nurses for clinical concerns.    A surgeon from [facility] is always on call at the hospitals 24 hours/day If you have a medical emergency, go to the nearest emergency room or call 911.   Discharge wound care:   Complete by: As directed    It is good for closed incisions and even open wounds to be washed every day.  Shower every day.  Short baths are fine.  Wash the incisions and wounds clean with soap & water.     You may leave closed incisions open to air if it is dry.   You may cover the incision with clean gauze & replace it after your daily shower for comfort.   If you have skin tapes (Steristrips) or skin glue (Dermabond) on your incision, leave them in place.  They will fall off on their own like a scab.  You may trim any edges that curl up with clean scissors.    If you have skin staples, set up an appointment for them to be removed in the office in 10 days after surgery.  If you have a drain, wash around the skin exit site with soap & water and place a new dressing of gauze or band aid around the skin every day.  Keep the drain site clean & dry.   Driving Restrictions   Complete by: As directed    You may drive when: - you are no longer taking narcotic prescription pain medication - you can  comfortably wear a seatbelt - you can safely make sudden turns/stops without pain.   Increase activity slowly   Complete by: As directed    Start light daily activities --- self-care, walking, climbing stairs- beginning the day after surgery.  Gradually increase activities as tolerated.  Control your pain to be active.  Stop when you are tired.  Ideally, walk several times a day, eventually an hour a day.   Most people are back to most day-to-day activities in a few weeks.  It takes 4-6 weeks to get back to unrestricted, intense activity. If you can walk 30 minutes without difficulty, it is safe to try more intense activity such as jogging, treadmill, bicycling, low-impact aerobics, swimming, etc. Save the most intensive and strenuous activity for last (Usually 4-8 weeks after surgery)  such as sit-ups, heavy lifting, contact sports, etc.  Refrain from any intense heavy lifting or straining until you are off narcotics for pain control.  You will have off days, but things should improve week-by-week. DO NOT PUSH THROUGH PAIN.  Let pain be your guide: If it hurts to do something, don't do it.   Lifting restrictions   Complete by: As directed    If you can walk 30 minutes without difficulty, it is safe to try more intense activity such as jogging, treadmill, bicycling, low-impact aerobics, swimming, etc. Save the most intensive and strenuous activity for last (Usually 4-8 weeks after surgery) such as sit-ups, heavy lifting, contact sports, etc.   Refrain from any intense heavy lifting or straining until you are off narcotics for pain control.  You will have off days, but things should improve week-by-week. DO NOT PUSH THROUGH PAIN.  Let pain be your guide: If it hurts to do something, don't do it.  Pain is your body warning you to avoid that activity for another week until the pain goes down.   May shower / Bathe   Complete by: As directed    May walk up steps   Complete by: As directed    Remove  dressing in 72 hours   Complete by: As directed    Make sure all dressings are removed by the third day after surgery.  Leave incisions open to air.  OK to cover incisions with gauze or bandages as desired   Sexual Activity Restrictions   Complete by: As directed    You may have sexual intercourse when it is comfortable. If it hurts to do something, stop.      Allergies as of 11/04/2018      Reactions   Lisinopril Cough   Statins Other (See Comments)   Myalgias      Medication List    TAKE these medications   acetaminophen 500 MG tablet Commonly known as: TYLENOL Take 500 mg by mouth every 6 (six) hours as needed for moderate pain or headache.   albuterol 108 (90 Base) MCG/ACT inhaler Commonly known as: VENTOLIN HFA Use 2 puffs every 4 hours as needed for shortness of breath/wheezing What changed:   how much to take  how to take this  when to take this  reasons to take this  additional instructions   aspirin 81 MG tablet Take 81 mg by mouth daily.   budesonide-formoterol 160-4.5 MCG/ACT inhaler Commonly known as: Symbicort USE 2 PUFFS TWICE DAILY FOR ASTHMA. RINSE MOUTH AFTER USE. What changed:   how much to take  how to take this  when to take this  additional instructions   fluticasone 50 MCG/ACT nasal spray Commonly known as: FLONASE Place 2 sprays into both nostrils daily. What changed:   when to take this  reasons to take this   glipiZIDE 5 MG tablet Commonly known as: GLUCOTROL Take 0.5-1.5 tablets (2.5-7.5 mg total) by mouth See admin instructions. Take 7.5 mg by mouth in the morning and take 2.5 mg by mouth in the evening   latanoprost 0.005 % ophthalmic solution Commonly known as: XALATAN Place 1 drop into both eyes at bedtime.   losartan 50 MG tablet Commonly known as: COZAAR TAKE 1 TABLET BY MOUTH ONCE A DAY.   pantoprazole 40 MG tablet Commonly known as: PROTONIX TAKE 1 TABLET BY MOUTH ONCE DAILY.   PRESERVISION AREDS 2+MULTI  VIT PO Take 1 capsule by mouth 2 (two) times daily.  traMADol 50 MG tablet Commonly known as: ULTRAM Take 1-2 tablets (50-100 mg total) by mouth every 6 (six) hours as needed for moderate pain or severe pain.            Discharge Care Instructions  (From admission, onward)         Start     Ordered   11/03/18 0000  Discharge wound care:    Comments: It is good for closed incisions and even open wounds to be washed every day.  Shower every day.  Short baths are fine.  Wash the incisions and wounds clean with soap & water.     You may leave closed incisions open to air if it is dry.   You may cover the incision with clean gauze & replace it after your daily shower for comfort.   If you have skin tapes (Steristrips) or skin glue (Dermabond) on your incision, leave them in place.  They will fall off on their own like a scab.  You may trim any edges that curl up with clean scissors.    If you have skin staples, set up an appointment for them to be removed in the office in 10 days after surgery.  If you have a drain, wash around the skin exit site with soap & water and place a new dressing of gauze or band aid around the skin every day.  Keep the drain site clean & dry.   11/03/18 1039          Significant Diagnostic Studies:  Results for orders placed or performed during the hospital encounter of 11/03/18 (from the past 72 hour(s))  Glucose, capillary     Status: Abnormal   Collection Time: 11/03/18  6:49 AM  Result Value Ref Range   Glucose-Capillary 138 (H) 70 - 99 mg/dL  Glucose, capillary     Status: Abnormal   Collection Time: 11/03/18 10:55 AM  Result Value Ref Range   Glucose-Capillary 216 (H) 70 - 99 mg/dL  Glucose, capillary     Status: Abnormal   Collection Time: 11/03/18 12:01 PM  Result Value Ref Range   Glucose-Capillary 241 (H) 70 - 99 mg/dL  Glucose, capillary     Status: Abnormal   Collection Time: 11/03/18 12:04 PM  Result Value Ref Range    Glucose-Capillary 243 (H) 70 - 99 mg/dL  Glucose, capillary     Status: Abnormal   Collection Time: 11/03/18  4:34 PM  Result Value Ref Range   Glucose-Capillary 276 (H) 70 - 99 mg/dL  Glucose, capillary     Status: Abnormal   Collection Time: 11/03/18  9:52 PM  Result Value Ref Range   Glucose-Capillary 215 (H) 70 - 99 mg/dL  Basic metabolic panel     Status: Abnormal   Collection Time: 11/04/18  3:39 AM  Result Value Ref Range   Sodium 139 135 - 145 mmol/L   Potassium 4.2 3.5 - 5.1 mmol/L   Chloride 104 98 - 111 mmol/L   CO2 24 22 - 32 mmol/L   Glucose, Bld 156 (H) 70 - 99 mg/dL   BUN 27 (H) 8 - 23 mg/dL   Creatinine, Ser 1.37 (H) 0.44 - 1.00 mg/dL   Calcium 8.6 (L) 8.9 - 10.3 mg/dL   GFR calc non Af Amer 34 (L) >60 mL/min   GFR calc Af Amer 40 (L) >60 mL/min   Anion gap 11 5 - 15    Comment: Performed at [facility], [place]  [address] [place], [place] [address]  CBC     Status: Abnormal   Collection Time: 11/04/18  3:39 AM  Result Value Ref Range   WBC 15.0 (H) 4.0 - 10.5 K/uL   RBC 3.69 (L) 3.87 - 5.11 MIL/uL   Hemoglobin 11.1 (L) 12.0 - 15.0 g/dL   HCT 34.5 (L) 36.0 - 46.0 %   MCV 93.5 80.0 - 100.0 fL   MCH 30.1 26.0 - 34.0 pg   MCHC 32.2 30.0 - 36.0 g/dL   RDW 14.0 11.5 - 15.5 %   Platelets 139 (L) 150 - 400 K/uL   nRBC 0.0 0.0 - 0.2 %    Comment: Performed at [facility], [place] [address] [place], [place] [address]  Magnesium     Status: None   Collection Time: 11/04/18  3:39 AM  Result Value Ref Range   Magnesium 2.1 1.7 - 2.4 mg/dL    Comment: Performed at [facility], [place] [address] [place], [place] [address]    No results found.  Past Medical History:  Diagnosis Date  . Arthritis   . Asthma   . Cancer ([place])    BREAST CANCER LEFT BREAST  . Chest pain    Evaluation prior to 2003 reportedly including cath-records never located; Echo in 2009: Mild LVH, LAE2, nl EF, moderate LAE, MR1-2  . Chronic  kidney disease   . COPD (chronic obstructive pulmonary disease) ([facility])    Exertional dyspnea; PFTs in 2002: Moderate small airway obstruction, mildly reduced lung volumes and DLCO, nl ABG  . Diabetes mellitus, type II ([place])   . Dyspnea   . Family history of adverse reaction to anesthesia    daugher has postop N&V  . Fatigue   . GERD (gastroesophageal reflux disease)   . Hyperlipidemia   . Hypertension   . Legally blind 1 1 2015  . Macular degeneration of both eyes 1 1 2015  . Parotid mass 2003   Right-2003  . Scoliosis   . Shingles     Past Surgical History:  Procedure Laterality Date  . APPENDECTOMY  1951  . BREAST BIOPSY Left 10/05/2017   Procedure: BREAST BIOPSY;  Surgeon: Aviva Signs, MD;  Location: [facility];  Service: General;  Laterality: Left;  . BREAST LUMPECTOMY Left 03/18/2018  . BREAST LUMPECTOMY Left 03/18/2018   Procedure: LEFT BREAST CENTRAL  LUMPECTOMY;  Surgeon: Rolm Bookbinder, MD;  Location: [facility];  Service: General;  Laterality: Left;  . CARDIAC CATHETERIZATION  1999  . CHOLECYSTECTOMY  2003  . COLONOSCOPY  04/16/2006   [place]-Normal colon.  Exam limited due to retained particulate matter in the lumen of the colon, polyps less than 1 cm would have been easily missed.  Otherwise, no polyps, masses, inflammatory changes or vascular ectasia seen/ Normal retroflexed view of the rectum..  recommend repeat exam at 03/2011  . ORIF  1996  . ORIF ANKLE FRACTURE  1992  . THYROIDECTOMY N/A 11/03/2018   Procedure: TOTAL THYROIDECTOMY;  Surgeon: Michael Boston, MD;  Location: [facility];  Service: General;  Laterality: N/A;    Social History   Socioeconomic History  . Marital status: Widowed    Spouse name: Not on file  . Number of children: 3  . Years of education: Not on file  . Highest education level: Not on file  Occupational History  . Occupation: Retired  Scientific laboratory technician  . Financial resource strain: Not on file  . Food insecurity    Worry: Not on  file     Inability: Not on file  . Transportation needs    Medical: Not on file    Non-medical: Not on file  Tobacco Use  . Smoking status: Passive Smoke Exposure - Never Smoker  . Smokeless tobacco: Never Used  Substance and Sexual Activity  . Alcohol use: No  . Drug use: No  . Sexual activity: Not on file  Lifestyle  . Physical activity    Days per week: Not on file    Minutes per session: Not on file  . Stress: Not on file  Relationships  . Social Herbalist on phone: Not on file    Gets together: Not on file    Attends religious service: Not on file    Active member of club or organization: Not on file    Attends meetings of clubs or organizations: Not on file    Relationship status: Not on file  . Intimate partner violence    Fear of current or ex partner: Not on file    Emotionally abused: Not on file    Physically abused: Not on file    Forced sexual activity: Not on file  Other Topics Concern  . Not on file  Social History Narrative   Widowed   3 children   No regular exercise    Family History  Problem Relation Age of Onset  . Stomach cancer Mother   . Alzheimer's disease Sister   . Colon cancer Neg Hx     Current Facility-Administered Medications  Medication Dose Route Frequency Provider Last Rate Last Dose  . 0.9 %  sodium chloride infusion   Intravenous Q8H PRN Michael Boston, MD      . 0.9 %  sodium chloride infusion  250 mL Intravenous PRN Michael Boston, MD      . 0.9 %  sodium chloride infusion  250 mL Intravenous PRN Michael Boston, MD      . acetaminophen (TYLENOL) tablet 1,000 mg  1,000 mg Oral Lajuana Ripple, MD   1,000 mg at 11/04/18 0534  . acetaminophen (TYLENOL) tablet 500 mg  500 mg Oral Q6H PRN Michael Boston, MD      . albuterol (PROVENTIL) (2.5 MG/3ML) 0.083% nebulizer solution 2.5 mg  2.5 mg Inhalation Q4H PRN Michael Boston, MD      . alum & mag hydroxide-simeth (MAALOX/MYLANTA) 200-200-20 MG/5ML suspension 30 mL  30 mL Oral Q6H PRN  Michael Boston, MD      . aspirin chewable tablet 81 mg  81 mg Oral Daily Michael Boston, MD      . diphenhydrAMINE (BENADRYL) 12.5 MG/5ML elixir 12.5 mg  12.5 mg Oral Q6H PRN Michael Boston, MD       Or  . diphenhydrAMINE (BENADRYL) injection 12.5 mg  12.5 mg Intravenous Q6H PRN Michael Boston, MD      . enoxaparin (LOVENOX) injection 40 mg  40 mg Subcutaneous Q24H Michael Boston, MD      . feeding supplement (ENSURE SURGERY) liquid 237 mL  237 mL Oral BID BM Michael Boston, MD      . fluticasone (FLONASE) 50 MCG/ACT nasal spray 2 spray  2 spray Each Nare Daily Michael Boston, MD      . fluticasone furoate-vilanterol (BREO ELLIPTA) 200-25 MCG/INH 1 puff  1 puff Inhalation Daily Michael Boston, MD      . gabapentin (NEURONTIN) capsule 300 mg  300 mg Oral BID Michael Boston, MD   300 mg at 11/03/18 2220  .  glipiZIDE (GLUCOTROL) tablet 2.5 mg  2.5 mg Oral QAC supper Michael Boston, MD   2.5 mg at 11/03/18 1641  . glipiZIDE (GLUCOTROL) tablet 7.5 mg  7.5 mg Oral QAC breakfast Michael Boston, MD      . hydrALAZINE (APRESOLINE) injection 10 mg  10 mg Intravenous Q2H PRN Michael Boston, MD   10 mg at 11/03/18 1358  . HYDROmorphone (DILAUDID) injection 0.5-2 mg  0.5-2 mg Intravenous Q4H PRN Michael Boston, MD   0.5 mg at 11/03/18 1630  . insulin aspart (novoLOG) injection 0-15 Units  0-15 Units Subcutaneous TID WC Michael Boston, MD   8 Units at 11/03/18 1640  . insulin aspart (novoLOG) injection 0-5 Units  0-5 Units Subcutaneous Ardeen Fillers, MD   2 Units at 11/03/18 2223  . lactated ringers bolus 1,000 mL  1,000 mL Intravenous Q8H PRN Michael Boston, MD      . latanoprost (XALATAN) 0.005 % ophthalmic solution 1 drop  1 drop Both Eyes Ardeen Fillers, MD   1 drop at 11/03/18 2226  . losartan (COZAAR) tablet 50 mg  50 mg Oral Daily Michael Boston, MD   50 mg at 11/03/18 1326  . metoprolol tartrate (LOPRESSOR) injection 5 mg  5 mg Intravenous Q6H PRN Michael Boston, MD      . ondansetron [facility]) tablet 4 mg  4 mg  Oral Q6H PRN Michael Boston, MD       Or  . ondansetron (ZOFRAN) injection 4 mg  4 mg Intravenous Q6H PRN Michael Boston, MD      . pantoprazole (PROTONIX) EC tablet 40 mg  40 mg Oral Daily Michael Boston, MD   40 mg at 11/03/18 1326  . prochlorperazine (COMPAZINE) tablet 10 mg  10 mg Oral Q6H PRN Michael Boston, MD       Or  . prochlorperazine (COMPAZINE) injection 5-10 mg  5-10 mg Intravenous Q6H PRN Michael Boston, MD      . saccharomyces boulardii (FLORASTOR) capsule 250 mg  250 mg Oral BID Michael Boston, MD   250 mg at 11/03/18 2219  . sodium chloride flush (NS) 0.9 % injection 3 mL  3 mL Intravenous Gorden Harms, MD   3 mL at 11/03/18 2226  . sodium chloride flush (NS) 0.9 % injection 3 mL  3 mL Intravenous PRN Michael Boston, MD      . sodium chloride flush (NS) 0.9 % injection 3 mL  3 mL Intravenous Gorden Harms, MD   3 mL at 11/03/18 2225  . sodium chloride flush (NS) 0.9 % injection 3 mL  3 mL Intravenous PRN Michael Boston, MD      . traMADol Veatrice Bourbon) tablet 50-100 mg  50-100 mg Oral Q6H PRN Michael Boston, MD         Allergies  Allergen Reactions  . Lisinopril Cough  . Statins Other (See Comments)    Myalgias    Signed: Morton Peters, MD, FACS, MASCRS Gastrointestinal and Minimally Invasive Surgery    [address] [place], [place] [address] [phone] Main / Paging [phone] Fax   11/04/2018, 7:50 AM

## 2018-11-05 DIAGNOSIS — C73 Malignant neoplasm of thyroid gland: Secondary | ICD-10-CM | POA: Diagnosis not present

## 2018-11-18 ENCOUNTER — Telehealth: Payer: Self-pay | Admitting: Family Medicine

## 2018-11-18 MED ORDER — FAMOTIDINE 20 MG PO TABS: ORAL_TABLET | ORAL | 1 refills | Status: DC

## 2018-11-18 MED ORDER — PANTOPRAZOLE SODIUM 40 MG PO TBEC: 40.0000 mg | DELAYED_RELEASE_TABLET | Freq: Every day | ORAL | 1 refills | Status: DC

## 2018-11-18 NOTE — Telephone Encounter (Signed)
Spoke with patient. Pt states she has a lump in her throat; has to clear throat multiple times a day. She thought after she had surgery it would go away but it didn't. Sent in [place] to [facility]. Pt verbalized understanding.

## 2018-11-18 NOTE — Telephone Encounter (Signed)
Patient having a lot of reflux.  Was wondering if she could take her reflux meds twice a day, one in morning and one at night.  If so will need a refill of:  pantoprazole (PROTONIX) 40 MG tablet  [facility]  FYI-Patient had thyroid surgery June 17th for thyroid cancer

## 2018-11-18 NOTE — Telephone Encounter (Signed)
Nurses-please see how the patient is doing talk with her regarding her symptoms if any concerning factors please alert me  As for the PPI I would not recommend twice daily because it can work against her bone density and increase the risk of other issues also 1.  Her insurance company unlikely to pay for twice daily 2.  She should consider trying to prop herself up some or putting the bed on a slight tilt-head of bed on 6 inch blocks #3 avoid eating meals within 2 hours of bedtime #4 May use Pepcid 20 mg nightly, #30, 5 refills and continue pantoprazole earlier in the day If not dramatically better within the next 1 to 2 weeks to do a virtual follow-up

## 2018-11-18 NOTE — Telephone Encounter (Signed)
Please advise. Thank you

## 2018-11-25 ENCOUNTER — Telehealth: Payer: Self-pay | Admitting: Family Medicine

## 2018-11-25 NOTE — Telephone Encounter (Signed)
Please find out from the patient Is she taking any of the glipizide later in the day or just 1-1/2 in the morning? How has her sugar readings been recently? Then we can send in her prescription

## 2018-11-25 NOTE — Telephone Encounter (Signed)
Patient is taking 1.5 tabs in the am and 0.5 tab in pm as prescribed-Patient states her sugars are running 150 and doing well- [facility] stated that someone accidentally looked up and filled her old prescription and they will fill the correct prescription and get the medication ready for the patient as prescribed

## 2018-11-25 NOTE — Telephone Encounter (Signed)
Pt is requesting refill on glipiZIDE (GLUCOTROL) 5 MG tablet. She is taking 1.5 tablets in the morning so she ran out early. States the bottle says to only take 1 tablet in the morning and that the instructions are wrong.   [place], [place]

## 2018-11-26 ENCOUNTER — Telehealth: Payer: Self-pay | Admitting: Family Medicine

## 2018-11-26 NOTE — Telephone Encounter (Signed)
Patient had phone visit with you on 6/3 and was told she would receive copy of visit in the mail along with prescription for her sinuses  But havent receive it yet. Please advise

## 2018-11-26 NOTE — Telephone Encounter (Signed)
Please advise. Thank you

## 2018-11-27 NOTE — Telephone Encounter (Signed)
Nurses Please apologize to the patient regarding the sinus issues that slipped my mind It was my understanding that if we were to notify Dr. Lum Babe regarding her surgery I did  follow through on notifying Dr. Dorris Fetch They set her up follow-up appointment later in July to monitor her blood work further As for the sinuses I would recommend OTC loratadine 10 mg 1 daily I also recommend amoxicillin 500 mg 1 3 times daily for 7 days If ongoing troubles let us know The patient may have a copy of her last office visit with [place] mailed to her  I would recommend a virtual follow-up visit with the patient in September sooner if she needs

## 2018-11-29 MED ORDER — AMOXICILLIN 500 MG PO CAPS: 500.0000 mg | ORAL_CAPSULE | Freq: Three times a day (TID) | ORAL | 0 refills | Status: DC

## 2018-11-29 NOTE — Telephone Encounter (Signed)
Discussed with patient. Prescription sent electronically to pharmacy. Patient verbalized understanding and will let us know if she needs anything else from [place].

## 2018-12-03 DIAGNOSIS — M79674 Pain in right toe(s): Secondary | ICD-10-CM | POA: Diagnosis not present

## 2018-12-03 DIAGNOSIS — E11621 Type 2 diabetes mellitus with foot ulcer: Secondary | ICD-10-CM | POA: Diagnosis not present

## 2018-12-06 DIAGNOSIS — H35033 Hypertensive retinopathy, bilateral: Secondary | ICD-10-CM | POA: Diagnosis not present

## 2018-12-06 DIAGNOSIS — H353124 Nonexudative age-related macular degeneration, left eye, advanced atrophic with subfoveal involvement: Secondary | ICD-10-CM | POA: Diagnosis not present

## 2018-12-06 DIAGNOSIS — H35372 Puckering of macula, left eye: Secondary | ICD-10-CM | POA: Diagnosis not present

## 2018-12-06 DIAGNOSIS — H353211 Exudative age-related macular degeneration, right eye, with active choroidal neovascularization: Secondary | ICD-10-CM | POA: Diagnosis not present

## 2018-12-07 ENCOUNTER — Other Ambulatory Visit: Payer: Self-pay | Admitting: Family Medicine

## 2018-12-08 ENCOUNTER — Other Ambulatory Visit: Payer: Self-pay | Admitting: "Endocrinology

## 2018-12-08 DIAGNOSIS — C73 Malignant neoplasm of thyroid gland: Secondary | ICD-10-CM | POA: Diagnosis not present

## 2018-12-09 LAB — COMPREHENSIVE METABOLIC PANEL
ALT: 10 IU/L (ref 0–32)
AST: 13 IU/L (ref 0–40)
Albumin/Globulin Ratio: 2.5 — ABNORMAL HIGH (ref 1.2–2.2)
Albumin: 4.5 g/dL (ref 3.6–4.6)
Alkaline Phosphatase: 90 IU/L (ref 39–117)
BUN/Creatinine Ratio: 24 (ref 12–28)
BUN: 29 mg/dL — ABNORMAL HIGH (ref 8–27)
Bilirubin Total: 0.4 mg/dL (ref 0.0–1.2)
CO2: 23 mmol/L (ref 20–29)
Calcium: 9.1 mg/dL (ref 8.7–10.3)
Chloride: 105 mmol/L (ref 96–106)
Creatinine, Ser: 1.22 mg/dL — ABNORMAL HIGH (ref 0.57–1.00)
GFR calc Af Amer: 46 mL/min/{1.73_m2} — ABNORMAL LOW (ref >59)
GFR calc non Af Amer: 40 mL/min/{1.73_m2} — ABNORMAL LOW (ref >59)
Globulin, Total: 1.8 g/dL (ref 1.5–4.5)
Glucose: 174 mg/dL — ABNORMAL HIGH (ref 65–99)
Potassium: 5 mmol/L (ref 3.5–5.2)
Sodium: 141 mmol/L (ref 134–144)
Total Protein: 6.3 g/dL (ref 6.0–8.5)

## 2018-12-09 LAB — T4, FREE: Free T4: 1.15 ng/dL (ref 0.82–1.77)

## 2018-12-09 LAB — TSH: TSH: 8.36 u[IU]/mL — ABNORMAL HIGH (ref 0.450–4.500)

## 2018-12-10 ENCOUNTER — Other Ambulatory Visit: Payer: Self-pay

## 2018-12-10 ENCOUNTER — Ambulatory Visit (INDEPENDENT_AMBULATORY_CARE_PROVIDER_SITE_OTHER): Payer: PPO | Admitting: "Endocrinology

## 2018-12-10 ENCOUNTER — Encounter: Payer: Self-pay | Admitting: "Endocrinology

## 2018-12-10 VITALS — BP 160/82 | HR 64 | Ht 63.0 in | Wt 177.0 lb

## 2018-12-10 DIAGNOSIS — E890 Postprocedural hypothyroidism: Secondary | ICD-10-CM | POA: Diagnosis not present

## 2018-12-10 DIAGNOSIS — C73 Malignant neoplasm of thyroid gland: Secondary | ICD-10-CM | POA: Diagnosis not present

## 2018-12-10 MED ORDER — LEVOTHYROXINE SODIUM 112 MCG PO TABS: 112.0000 ug | ORAL_TABLET | Freq: Every day | ORAL | 3 refills | Status: DC

## 2018-12-10 NOTE — Progress Notes (Signed)
Endocrinology follow-up Note                                            12/10/2018, 9:24 AM   Subjective:    Patient ID: Victoria Vasquez, female    DOB: 06-26-1927, PCP Kathyrn Drown, MD   Past Medical History:  Diagnosis Date  . Arthritis   . Asthma   . Cancer ([place])    BREAST CANCER LEFT BREAST  . Chest pain    Evaluation prior to 2003 reportedly including cath-records never located; Echo in 2009: Mild LVH, LAE2, nl EF, moderate LAE, MR1-2  . Chronic kidney disease   . COPD (chronic obstructive pulmonary disease) (H[facility])    Exertional dyspnea; PFTs in 2002: Moderate small airway obstruction, mildly reduced lung volumes and DLCO, nl ABG  . Diabetes mellitus, type II ([place])   . Dyspnea   . Family history of adverse reaction to anesthesia    daugher has postop N&V  . Fatigue   . GERD (gastroesophageal reflux disease)   . Hyperlipidemia   . Hypertension   . Legally blind 1 1 2015  . Macular degeneration of both eyes 1 1 2015  . Parotid mass 2003   Right-2003  . Scoliosis   . Shingles    Past Surgical History:  Procedure Laterality Date  . APPENDECTOMY  1951  . BREAST BIOPSY Left 10/05/2017   Procedure: BREAST BIOPSY;  Surgeon: Aviva Signs, MD;  Location: [facility];  Service: General;  Laterality: Left;  . BREAST LUMPECTOMY Left 03/18/2018  . BREAST LUMPECTOMY Left 03/18/2018   Procedure: LEFT BREAST CENTRAL  LUMPECTOMY;  Surgeon: Rolm Bookbinder, MD;  Location: [place];  Service: General;  Laterality: Left;  . CARDIAC CATHETERIZATION  1999  . CHOLECYSTECTOMY  2003  . COLONOSCOPY  04/16/2006   [place]-Normal colon.  Exam limited due to retained particulate matter in the lumen of the colon, polyps less than 1 cm would have been easily missed.  Otherwise, no polyps, masses, inflammatory changes or vascular ectasia seen/ Normal retroflexed view of the rectum..  recommend repeat exam at 03/2011  . ORIF  1996  . ORIF ANKLE FRACTURE  1992  . THYROIDECTOMY N/A 11/03/2018   Procedure: TOTAL THYROIDECTOMY;  Surgeon: Michael Boston, MD;  Location: [facility];  Service: General;  Laterality: N/A;   Social History   Socioeconomic History  . Marital status: Widowed    Spouse name: Not on file  . Number of children: 3  . Years of education: Not on file  . Highest education level: Not on file  Occupational History  . Occupation: Retired  Scientific laboratory technician  . Financial resource strain: Not on file  . Food insecurity    Worry: Not on file    Inability: Not on file  . Transportation needs    Medical: Not on file    Non-medical: Not on file  Tobacco Use  . Smoking status: Passive Smoke Exposure - Never Smoker  . Smokeless tobacco: Never Used  Substance and Sexual Activity  . Alcohol use: No  . Drug use: No  . Sexual activity: Not on file  Lifestyle  . Physical activity    Days per week: Not on file    Minutes per session: Not on file  . Stress: Not on file  Relationships  . Social connections  Talks on phone: Not on file    Gets together: Not on file    Attends religious service: Not on file    Active member of club or organization: Not on file    Attends meetings of clubs or organizations: Not on file    Relationship status: Not on file  Other Topics Concern  . Not on file  Social History Narrative   Widowed   3 children   No regular exercise   Outpatient Encounter Medications as of 12/10/2018  Medication Sig  . levothyroxine (SYNTHROID) 112 MCG tablet Take 1 tablet (112 mcg total) by mouth daily before breakfast.  . [DISCONTINUED] levothyroxine (SYNTHROID) 100 MCG tablet Take 100 mcg by mouth daily before breakfast.  . acetaminophen (TYLENOL) 500 MG tablet Take 500 mg by mouth every 6 (six) hours as needed for moderate pain or headache.  . albuterol (PROVENTIL HFA;VENTOLIN HFA) 108 (90 Base) MCG/ACT inhaler Use 2 puffs every 4 hours as needed for shortness of breath/wheezing (Patient taking differently: Inhale 2 puffs into the lungs every 4 (four)  hours as needed for wheezing or shortness of breath. )  . amoxicillin (AMOXIL) 500 MG capsule Take 1 capsule (500 mg total) by mouth 3 (three) times daily.  Marland Kitchen aspirin 81 MG tablet Take 81 mg by mouth daily.  . budesonide-formoterol (SYMBICORT) 160-4.5 MCG/ACT inhaler USE 2 PUFFS TWICE DAILY FOR ASTHMA. RINSE MOUTH AFTER USE. (Patient taking differently: Inhale 2 puffs into the lungs 2 (two) times daily. )  . famotidine (PEPCID) 20 MG tablet Take one tablet by mouth each night  . fluticasone (FLONASE) 50 MCG/ACT nasal spray Place 2 sprays into both nostrils daily. (Patient taking differently: Place 2 sprays into both nostrils daily as needed for allergies. )  . glipiZIDE (GLUCOTROL) 5 MG tablet Take 0.5-1.5 tablets (2.5-7.5 mg total) by mouth See admin instructions. Take 7.5 mg by mouth in the morning and take 2.5 mg by mouth in the evening  . latanoprost (XALATAN) 0.005 % ophthalmic solution Place 1 drop into both eyes at bedtime.   Marland Kitchen losartan (COZAAR) 50 MG tablet TAKE 1 TABLET BY MOUTH ONCE A DAY.  . Multiple Vitamins-Minerals (PRESERVISION AREDS 2+MULTI VIT PO) Take 1 capsule by mouth 2 (two) times daily.   . pantoprazole (PROTONIX) 40 MG tablet Take 1 tablet (40 mg total) by mouth daily.  . traMADol (ULTRAM) 50 MG tablet Take 1-2 tablets (50-100 mg total) by mouth every 6 (six) hours as needed for moderate pain or severe pain.   No facility-administered encounter medications on file as of 12/10/2018.    ALLERGIES: Allergies  Allergen Reactions  . Lisinopril Cough  . Statins Other (See Comments)    Myalgias    VACCINATION STATUS: Immunization History  Administered Date(s) Administered  . Influenza Split 03/07/2013  . Influenza,inj,Quad PF,6+ Mos 03/27/2014, 03/27/2015, 01/24/2016, 02/12/2017, 02/16/2018  . Pneumococcal Conjugate-13 05/18/2014  . Pneumococcal Polysaccharide-23 01/24/2016  . Zoster Recombinat (Shingrix) 06/23/2018    HPI Victoria Vasquez is 85 y.o. female who is  status post total thyroidectomy on November 03, 2018 after her fine-needle aspiration of a thyroid nodule revealed papillary thyroid cancer.     Notes recorded by Michael Boston, MD on 11/04/2018 at 2:43 PM EDT  Pt s/p total thyroidectomy 11/03/2018  Papillary CA x2. Largest pT3a, pNX. Margins negative   -Ms. Shanley is elderly,  accompanied by her grown daughter.  She has done very well during and after her surgery, recovering very well.  She is currently  on levothyroxine 100 mcg p.o. nightly.   She denies dysphagia, shortness of breath, voice change.   -Her previously reported swallowing difficulties resolved.    Review of Systems  Constitutional: + weight loss, no fatigue, no subjective hyperthermia, no subjective hypothermia Eyes: no blurry vision, no xerophthalmia ENT: no sore throat, no nodules palpated in throat, + status post thyroidectomy,  no hoarseness Musculoskeletal: no muscle/joint aches Skin: no rashes Neurological: no tremors, no numbness, no tingling, no dizziness Psychiatric: no depression, no anxiety  Objective:    BP (!) 160/82   Pulse 64   Ht 5' 3"  (1.6 m)   Wt 177 lb (80.3 kg)   BMI 31.35 kg/m   Wt Readings from Last 3 Encounters:  12/10/18 177 lb (80.3 kg)  11/01/18 177 lb (80.3 kg)  07/08/18 186 lb (84.4 kg)    Physical Exam  Constitutional:  + Obese for height, not in acute distress, normal state of mind, + walks with a cane. Eyes: PERRLA, EOMI, no exophthalmos ENT: moist mucous membranes, + post thyroidectomy scar, slightly swollen, no sign of infection.   Skin: moist, warm, no rashes Neurological: no tremor with outstretched hands, Deep tendon reflexes normal in bilateral lower extremities.  CMP ( most recent) CMP     Component Value Date/Time   NA 141 12/08/2018 1051   K 5.0 12/08/2018 1051   CL 105 12/08/2018 1051   CO2 23 12/08/2018 1051   GLUCOSE 174 (H) 12/08/2018 1051   GLUCOSE 156 (H) 11/04/2018 0339   BUN 29 (H) 12/08/2018 1051    CREATININE 1.22 (H) 12/08/2018 1051   CREATININE 1.05 08/17/2013 0846   CALCIUM 9.1 12/08/2018 1051   PROT 6.3 12/08/2018 1051   PROT 5.7 01/22/2012 1316   ALBUMIN 4.5 12/08/2018 1051   AST 13 12/08/2018 1051   AST 14 01/22/2012 1316   ALT 10 12/08/2018 1051   ALKPHOS 90 12/08/2018 1051   ALKPHOS 48 01/22/2012 1316   BILITOT 0.4 12/08/2018 1051   BILITOT 0.4 01/22/2012 1316   GFRNONAA 40 (L) 12/08/2018 1051   GFRAA 46 (L) 12/08/2018 1051     Diabetic Labs (most recent): Lab Results  Component Value Date   HGBA1C 8.4 (H) 11/01/2018   HGBA1C 8.7 (H) 10/12/2018   HGBA1C 8.5 (H) 06/11/2018     Lipid Panel ( most recent) Lipid Panel     Component Value Date/Time   CHOL 261 (H) 10/12/2018 1149   TRIG 245 (H) 10/12/2018 1149   TRIG 177 09/04/2008   HDL 39 (L) 10/12/2018 1149   CHOLHDL 6.7 (H) 10/12/2018 1149   CHOLHDL 5.9 08/17/2013 0846   VLDL 42 (H) 08/17/2013 0846   LDLCALC 173 (H) 10/12/2018 1149   LDLCALC 100 09/04/2008      Lab Results  Component Value Date   TSH 8.360 (H) 12/08/2018   TSH 1.320 03/30/2018   TSH 1.604 09/08/2012   TSH 1.145 01/01/2009   FREET4 1.15 12/08/2018   FREET4 1.16 03/30/2018      Assessment & Plan:   1.  Papillary thyroid cancer -status post total thyroidectomy Notes recorded by Michael Boston, MD on 11/04/2018 at 2:43 PM EDT  Pt s/p total thyroidectomy 11/03/2018  Papillary CA x2. Largest pT3a, pNX. Margins negative.  -4.8 cm nodules classic papillary thyroid cancer, 0.7 cm nodule with follicular variant PTC.  -Compressive symptoms have resolved. -She will not be considered for I-131 radioactive iodine ablation.  She will be followed with thyroglobulin and thyroglobulin bodies in 3 months.  2.  postsurgical hypothyroidism Her previsit thyroid function tests indicate inadequate replacement.  I discussed increase her levothyroxine to 112 mcg p.o. daily before breakfast.   - We discussed about the correct intake of her thyroid  hormone, on empty stomach at fasting, with water, separated by at least 30 minutes from breakfast and other medications,  and separated by more than 4 hours from calcium, iron, multivitamins, acid reflux medications (PPIs). -Patient is made aware of the fact that thyroid hormone replacement is needed for life, dose to be adjusted by periodic monitoring of thyroid function tests.  - I advised her  to maintain close follow up with Kathyrn Drown, MD for primary care needs.     Time for this visit: 15 minutes. Victoria Vasquez  participated in the discussions, expressed understanding, and voiced agreement with the above plans.  All questions were answered to her satisfaction. she is encouraged to contact clinic should she have any questions or concerns prior to her return visit.  Follow up plan: Return in about 3 months (around 03/12/2019) for Follow up with Pre-visit Labs.   Glade Lloyd, MD [facility] [address] [place], [place] [address] Phone: [phone]  Fax: [phone]     12/10/2018, 9:24 AM  This note was partially dictated with voice recognition software. Similar sounding words can be transcribed inadequately or may not  be corrected upon review.

## 2018-12-17 ENCOUNTER — Other Ambulatory Visit: Payer: Self-pay

## 2019-01-04 ENCOUNTER — Other Ambulatory Visit: Payer: Self-pay | Admitting: Family Medicine

## 2019-01-07 DIAGNOSIS — M79674 Pain in right toe(s): Secondary | ICD-10-CM | POA: Diagnosis not present

## 2019-01-07 DIAGNOSIS — E11621 Type 2 diabetes mellitus with foot ulcer: Secondary | ICD-10-CM | POA: Diagnosis not present

## 2019-02-01 ENCOUNTER — Other Ambulatory Visit: Payer: Self-pay | Admitting: Family Medicine

## 2019-02-11 DIAGNOSIS — L97519 Non-pressure chronic ulcer of other part of right foot with unspecified severity: Secondary | ICD-10-CM | POA: Diagnosis not present

## 2019-02-11 DIAGNOSIS — M79674 Pain in right toe(s): Secondary | ICD-10-CM | POA: Diagnosis not present

## 2019-02-11 DIAGNOSIS — E1142 Type 2 diabetes mellitus with diabetic polyneuropathy: Secondary | ICD-10-CM | POA: Diagnosis not present

## 2019-02-21 ENCOUNTER — Other Ambulatory Visit: Payer: Self-pay | Admitting: Family Medicine

## 2019-02-25 ENCOUNTER — Other Ambulatory Visit (INDEPENDENT_AMBULATORY_CARE_PROVIDER_SITE_OTHER): Payer: PPO | Admitting: *Deleted

## 2019-02-25 ENCOUNTER — Other Ambulatory Visit: Payer: Self-pay

## 2019-02-25 DIAGNOSIS — Z23 Encounter for immunization: Secondary | ICD-10-CM

## 2019-02-25 DIAGNOSIS — E1142 Type 2 diabetes mellitus with diabetic polyneuropathy: Secondary | ICD-10-CM | POA: Diagnosis not present

## 2019-02-25 DIAGNOSIS — L97512 Non-pressure chronic ulcer of other part of right foot with fat layer exposed: Secondary | ICD-10-CM | POA: Diagnosis not present

## 2019-03-09 ENCOUNTER — Other Ambulatory Visit: Payer: Self-pay | Admitting: "Endocrinology

## 2019-03-09 DIAGNOSIS — E890 Postprocedural hypothyroidism: Secondary | ICD-10-CM | POA: Diagnosis not present

## 2019-03-09 DIAGNOSIS — C73 Malignant neoplasm of thyroid gland: Secondary | ICD-10-CM | POA: Diagnosis not present

## 2019-03-09 LAB — TSH: TSH: 0.26 — AB (ref <=5.90)

## 2019-03-10 ENCOUNTER — Other Ambulatory Visit: Payer: Self-pay | Admitting: "Endocrinology

## 2019-03-10 LAB — TSH: TSH: 0.258 u[IU]/mL — ABNORMAL LOW (ref 0.450–4.500)

## 2019-03-10 LAB — T4, FREE: Free T4: 1.59 ng/dL (ref 0.82–1.77)

## 2019-03-10 LAB — THYROGLOBULIN ANTIBODY: Thyroglobulin Antibody: 691.9 IU/mL — ABNORMAL HIGH (ref 0.0–0.9)

## 2019-03-16 ENCOUNTER — Other Ambulatory Visit: Payer: Self-pay

## 2019-03-16 ENCOUNTER — Other Ambulatory Visit: Payer: Self-pay | Admitting: Family Medicine

## 2019-03-16 ENCOUNTER — Ambulatory Visit (INDEPENDENT_AMBULATORY_CARE_PROVIDER_SITE_OTHER): Payer: PPO | Admitting: "Endocrinology

## 2019-03-16 ENCOUNTER — Encounter: Payer: Self-pay | Admitting: "Endocrinology

## 2019-03-16 DIAGNOSIS — C73 Malignant neoplasm of thyroid gland: Secondary | ICD-10-CM

## 2019-03-16 DIAGNOSIS — E890 Postprocedural hypothyroidism: Secondary | ICD-10-CM

## 2019-03-16 NOTE — Progress Notes (Signed)
03/16/2019, 5:26 PM                                Endocrinology Telehealth Visit Follow up Note -During COVID -19 Pandemic  I connected with Victoria Vasquez on 03/16/2019   by telephone and verified that I am speaking with the correct person using two identifiers. Victoria Vasquez, [birth year]. she has verbally consented to this visit. All issues noted in this document were discussed and addressed. The format was not optimal for physical exam.   Subjective:    Patient ID: Victoria Vasquez, female    DOB: July 28, 1979, PCP Kathyrn Drown, MD   Past Medical History:  Diagnosis Date  . Arthritis   . Asthma   . Cancer ([place])    BREAST CANCER LEFT BREAST  . Chest pain    Evaluation prior to 2003 reportedly including cath-records never located; Echo in 2009: Mild LVH, LAE2, nl EF, moderate LAE, MR1-2  . Chronic kidney disease   . COPD (chronic obstructive pulmonary disease) (H[facility])    Exertional dyspnea; PFTs in 2002: Moderate small airway obstruction, mildly reduced lung volumes and DLCO, nl ABG  . Diabetes mellitus, type II ([place])   . Dyspnea   . Family history of adverse reaction to anesthesia    daugher has postop N&V  . Fatigue   . GERD (gastroesophageal reflux disease)   . Hyperlipidemia   . Hypertension   . Legally blind 1 1 2015  . Macular degeneration of both eyes 1 1 2015  . Parotid mass 2003   Right-2003  . Scoliosis   . Shingles    Past Surgical History:  Procedure Laterality Date  . APPENDECTOMY  1951  . BREAST BIOPSY Left 10/05/2017   Procedure: BREAST BIOPSY;  Surgeon: Aviva Signs, MD;  Location: [facility];  Service: General;  Laterality: Left;  . BREAST LUMPECTOMY Left 03/18/2018  . BREAST LUMPECTOMY Left 03/18/2018   Procedure: LEFT BREAST CENTRAL  LUMPECTOMY;  Surgeon: Rolm Bookbinder, MD;  Location: [place];  Service: General;  Laterality: Left;  . CARDIAC CATHETERIZATION  1999  . CHOLECYSTECTOMY  2003  .  COLONOSCOPY  04/16/2006   [place]-Normal colon.  Exam limited due to retained particulate matter in the lumen of the colon, polyps less than 1 cm would have been easily missed.  Otherwise, no polyps, masses, inflammatory changes or vascular ectasia seen/ Normal retroflexed view of the rectum..  recommend repeat exam at 03/2011  . ORIF  1996  . ORIF ANKLE FRACTURE  1992  . THYROIDECTOMY N/A 11/03/2018   Procedure: TOTAL THYROIDECTOMY;  Surgeon: Michael Boston, MD;  Location[facility];  Service: General;  Laterality: N/A;   Social History   Socioeconomic History  . Marital status: Widowed    Spouse name: Not on file  . Number of children: 3  . Years of education: Not on file  . Highest education level: Not on file  Occupational History  . Occupation: Retired  Scientific laboratory technician  . Financial resource strain: Not on file  . Food insecurity    Worry: Not on file    Inability: Not on file  . Transportation  needs    Medical: Not on file    Non-medical: Not on file  Tobacco Use  . Smoking status: Passive Smoke Exposure - Never Smoker  . Smokeless tobacco: Never Used  Substance and Sexual Activity  . Alcohol use: No  . Drug use: No  . Sexual activity: Not on file  Lifestyle  . Physical activity    Days per week: Not on file    Minutes per session: Not on file  . Stress: Not on file  Relationships  . Social Herbalist on phone: Not on file    Gets together: Not on file    Attends religious service: Not on file    Active member of club or organization: Not on file    Attends meetings of clubs or organizations: Not on file    Relationship status: Not on file  Other Topics Concern  . Not on file  Social History Narrative   Widowed   3 children   No regular exercise   Outpatient Encounter Medications as of 03/16/2019  Medication Sig  . acetaminophen (TYLENOL) 500 MG tablet Take 500 mg by mouth every 6 (six) hours as needed for moderate pain or headache.  . albuterol (PROVENTIL  HFA;VENTOLIN HFA) 108 (90 Base) MCG/ACT inhaler Use 2 puffs every 4 hours as needed for shortness of breath/wheezing (Patient taking differently: Inhale 2 puffs into the lungs every 4 (four) hours as needed for wheezing or shortness of breath. )  . amoxicillin (AMOXIL) 500 MG capsule Take 1 capsule (500 mg total) by mouth 3 (three) times daily.  Marland Kitchen aspirin 81 MG tablet Take 81 mg by mouth daily.  . budesonide-formoterol (SYMBICORT) 160-4.5 MCG/ACT inhaler USE 2 PUFFS TWICE DAILY FOR ASTHMA. RINSE MOUTH AFTER USE. (Patient taking differently: Inhale 2 puffs into the lungs 2 (two) times daily. )  . famotidine (PEPCID) 20 MG tablet Take one tablet by mouth each night  . fluticasone (FLONASE) 50 MCG/ACT nasal spray Place 2 sprays into both nostrils daily. (Patient taking differently: Place 2 sprays into both nostrils daily as needed for allergies. )  . latanoprost (XALATAN) 0.005 % ophthalmic solution Place 1 drop into both eyes at bedtime.   Marland Kitchen levothyroxine (SYNTHROID) 112 MCG tablet TAKE ONE TABLET BY MOUTH EVERY DAY BEFORE BREAKFAST  . losartan (COZAAR) 50 MG tablet TAKE 1 TABLET BY MOUTH ONCE A DAY.  . Multiple Vitamins-Minerals (PRESERVISION AREDS 2+MULTI VIT PO) Take 1 capsule by mouth 2 (two) times daily.   . pantoprazole (PROTONIX) 40 MG tablet Take 1 tablet (40 mg total) by mouth daily.  . traMADol (ULTRAM) 50 MG tablet Take 1-2 tablets (50-100 mg total) by mouth every 6 (six) hours as needed for moderate pain or severe pain.  . [DISCONTINUED] glipiZIDE (GLUCOTROL) 5 MG tablet TAKE 1 TABLET BY MOUTH EACH MORNING AND 1/2 TABLET AT SUPPER.   No facility-administered encounter medications on file as of 03/16/2019.    ALLERGIES: Allergies  Allergen Reactions  . Lisinopril Cough  . Statins Other (See Comments)    Myalgias    VACCINATION STATUS: Immunization History  Administered Date(s) Administered  . Influenza Split 03/07/2013  . Influenza,inj,Quad PF,6+ Mos 03/27/2014, 03/27/2015,  01/24/2016, 02/12/2017, 02/16/2018, 02/25/2019  . Pneumococcal Conjugate-13 05/18/2014  . Pneumococcal Polysaccharide-23 01/24/2016  . Zoster Recombinat (Shingrix) 06/23/2018, 01/28/2019    HPI Victoria Vasquez is 85 y.o. female who is status post total thyroidectomy on November 03, 2018 after her fine-needle aspiration of a thyroid nodule revealed  papillary thyroid cancer.    Pt s/p total thyroidectomy 11/03/2018  Papillary CA x2. Largest pT3a, pNX. Margins negative. She is currently on thyroid hormone replacement with levothyroxine 112 mcg p.o. daily before breakfast.  She reports.  She has no new complaints.    She has done very well during and after her surgery, recovering very well.    She denies dysphagia, shortness of breath, voice change.   -Her previously reported swallowing difficulties resolved.    Review of Systems Limited as above.  Objective:    There were no vitals taken for this visit.  Wt Readings from Last 3 Encounters:  12/10/18 177 lb (80.3 kg)  11/01/18 177 lb (80.3 kg)  07/08/18 186 lb (84.4 kg)    Physical Exam  CMP ( most recent) CMP     Component Value Date/Time   NA 141 12/08/2018 1051   K 5.0 12/08/2018 1051   CL 105 12/08/2018 1051   CO2 23 12/08/2018 1051   GLUCOSE 174 (H) 12/08/2018 1051   GLUCOSE 156 (H) 11/04/2018 0339   BUN 29 (H) 12/08/2018 1051   CREATININE 1.22 (H) 12/08/2018 1051   CREATININE 1.05 08/17/2013 0846   CALCIUM 9.1 12/08/2018 1051   PROT 6.3 12/08/2018 1051   PROT 5.7 01/22/2012 1316   ALBUMIN 4.5 12/08/2018 1051   AST 13 12/08/2018 1051   AST 14 01/22/2012 1316   ALT 10 12/08/2018 1051   ALKPHOS 90 12/08/2018 1051   ALKPHOS 48 01/22/2012 1316   BILITOT 0.4 12/08/2018 1051   BILITOT 0.4 01/22/2012 1316   GFRNONAA 40 (L) 12/08/2018 1051   GFRAA 46 (L) 12/08/2018 1051     Diabetic Labs (most recent): Lab Results  Component Value Date   HGBA1C 8.4 (H) 11/01/2018   HGBA1C 8.7 (H) 10/12/2018   HGBA1C 8.5 (H)  06/11/2018     Lipid Panel ( most recent) Lipid Panel     Component Value Date/Time   CHOL 261 (H) 10/12/2018 1149   TRIG 245 (H) 10/12/2018 1149   TRIG 177 09/04/2008   HDL 39 (L) 10/12/2018 1149   CHOLHDL 6.7 (H) 10/12/2018 1149   CHOLHDL 5.9 08/17/2013 0846   VLDL 42 (H) 08/17/2013 0846   LDLCALC 173 (H) 10/12/2018 1149   LDLCALC 100 09/04/2008      Lab Results  Component Value Date   TSH 0.258 (L) 03/09/2019   TSH 0.26 (A) 03/09/2019   TSH 8.360 (H) 12/08/2018   TSH 1.320 03/30/2018   TSH 1.604 09/08/2012   TSH 1.145 01/01/2009   FREET4 1.59 03/09/2019   FREET4 1.15 12/08/2018   FREET4 1.16 03/30/2018      Assessment & Plan:   1.  Papillary thyroid cancer -status post total thyroidectomy Notes recorded by Michael Boston, MD on 11/04/2018 at 2:43 PM EDT  Pt s/p total thyroidectomy 11/03/2018  Papillary CA x2. Largest pT3a, pNX. Margins negative.  -4.8 cm nodules classic papillary thyroid cancer, 0.7 cm nodule with follicular variant PTC.  -Compressive symptoms have resolved. -She will not be considered for I-131 radioactive iodine ablation.  She will be followed with thyroglobulin and thyroglobulin bodies in 3 months.  2.  postsurgical hypothyroidism Her previsit thyroid function tests indicate inadequate replacement.  Her TSH of 0.259 is in the recommended suppressed state while treating thyroid cancer.  She is advised to continue levothyroxine to 112 mcg p.o. daily before breakfast.   - We discussed about the correct intake of her thyroid hormone, on empty stomach at fasting, with  water, separated by at least 30 minutes from breakfast and other medications,  and separated by more than 4 hours from calcium, iron, multivitamins, acid reflux medications (PPIs). -Patient is made aware of the fact that thyroid hormone replacement is needed for life, dose to be adjusted by periodic monitoring of thyroid function tests.  - I advised her  to maintain close follow up  with Kathyrn Drown, MD for primary care needs.      Time for this visit: 15 minutes. Victoria Vasquez  participated in the discussions, expressed understanding, and voiced agreement with the above plans.  All questions were answered to her satisfaction. she is encouraged to contact clinic should she have any questions or concerns prior to her return visit.   Follow up plan: Return in about 6 months (around 09/14/2019) for Follow up with Pre-visit Labs.   Glade Lloyd, MD [facility] [address] [place], [place] [address] Phone: [phone]  Fax: [phone]     03/16/2019, 5:26 PM  This note was partially dictated with voice recognition software. Similar sounding words can be transcribed inadequately or may not  be corrected upon review.

## 2019-03-18 ENCOUNTER — Telehealth: Payer: Self-pay | Admitting: Family Medicine

## 2019-03-18 MED ORDER — GLIPIZIDE 5 MG PO TABS: ORAL_TABLET | ORAL | 0 refills | Status: DC

## 2019-03-18 NOTE — Telephone Encounter (Signed)
Prescription sent electronically to pharmacy. Patient notified. 

## 2019-03-18 NOTE — Telephone Encounter (Signed)
Patient is requesting refill on glipizide 5mg  she is completely out because she has been taking 1 and a half pills a day instead of just one pill. [place] to deliver please

## 2019-03-25 DIAGNOSIS — E1142 Type 2 diabetes mellitus with diabetic polyneuropathy: Secondary | ICD-10-CM | POA: Diagnosis not present

## 2019-03-25 DIAGNOSIS — L97519 Non-pressure chronic ulcer of other part of right foot with unspecified severity: Secondary | ICD-10-CM | POA: Diagnosis not present

## 2019-03-25 DIAGNOSIS — M79674 Pain in right toe(s): Secondary | ICD-10-CM | POA: Diagnosis not present

## 2019-04-04 DIAGNOSIS — H35033 Hypertensive retinopathy, bilateral: Secondary | ICD-10-CM | POA: Diagnosis not present

## 2019-04-04 DIAGNOSIS — H353212 Exudative age-related macular degeneration, right eye, with inactive choroidal neovascularization: Secondary | ICD-10-CM | POA: Diagnosis not present

## 2019-04-04 DIAGNOSIS — H353124 Nonexudative age-related macular degeneration, left eye, advanced atrophic with subfoveal involvement: Secondary | ICD-10-CM | POA: Diagnosis not present

## 2019-04-04 DIAGNOSIS — H35372 Puckering of macula, left eye: Secondary | ICD-10-CM | POA: Diagnosis not present

## 2019-04-06 DIAGNOSIS — H2513 Age-related nuclear cataract, bilateral: Secondary | ICD-10-CM | POA: Diagnosis not present

## 2019-04-06 DIAGNOSIS — H25013 Cortical age-related cataract, bilateral: Secondary | ICD-10-CM | POA: Diagnosis not present

## 2019-04-06 DIAGNOSIS — H401131 Primary open-angle glaucoma, bilateral, mild stage: Secondary | ICD-10-CM | POA: Diagnosis not present

## 2019-04-06 DIAGNOSIS — H5201 Hypermetropia, right eye: Secondary | ICD-10-CM | POA: Diagnosis not present

## 2019-04-18 ENCOUNTER — Other Ambulatory Visit: Payer: Self-pay | Admitting: Family Medicine

## 2019-04-18 NOTE — Telephone Encounter (Signed)
Recommend patient do a virtual visit May have 1 refill

## 2019-04-19 DIAGNOSIS — L97512 Non-pressure chronic ulcer of other part of right foot with fat layer exposed: Secondary | ICD-10-CM | POA: Diagnosis not present

## 2019-04-19 DIAGNOSIS — E1142 Type 2 diabetes mellitus with diabetic polyneuropathy: Secondary | ICD-10-CM | POA: Diagnosis not present

## 2019-04-20 NOTE — Telephone Encounter (Signed)
Patient calling back upset that she has to wait to get her refills. I explained to her that we could call in the refill today but she needed an appt. She set up an appt for 12/15 but insists on getting refills because she says this happens every month and she has to go 3 or 4 days without her meds and her sugar shoots up.   [place] for delivery

## 2019-04-20 NOTE — Telephone Encounter (Signed)
Dr Nicki Reaper do you want to give refills? See pt's message below

## 2019-04-20 NOTE — Telephone Encounter (Signed)
Go ahead and give her 1 month refill and we will discuss more at her follow-up office visit

## 2019-05-03 ENCOUNTER — Ambulatory Visit (INDEPENDENT_AMBULATORY_CARE_PROVIDER_SITE_OTHER): Payer: PPO | Admitting: Family Medicine

## 2019-05-03 ENCOUNTER — Other Ambulatory Visit: Payer: Self-pay

## 2019-05-03 ENCOUNTER — Ambulatory Visit: Payer: PPO | Admitting: Family Medicine

## 2019-05-03 VITALS — BP 124/82 | Temp 97.7°F | Wt 179.8 lb

## 2019-05-03 DIAGNOSIS — E785 Hyperlipidemia, unspecified: Secondary | ICD-10-CM

## 2019-05-03 DIAGNOSIS — D72829 Elevated white blood cell count, unspecified: Secondary | ICD-10-CM | POA: Diagnosis not present

## 2019-05-03 DIAGNOSIS — L97512 Non-pressure chronic ulcer of other part of right foot with fat layer exposed: Secondary | ICD-10-CM | POA: Diagnosis not present

## 2019-05-03 DIAGNOSIS — N183 Chronic kidney disease, stage 3 unspecified: Secondary | ICD-10-CM

## 2019-05-03 DIAGNOSIS — E1122 Type 2 diabetes mellitus with diabetic chronic kidney disease: Secondary | ICD-10-CM | POA: Diagnosis not present

## 2019-05-03 DIAGNOSIS — I10 Essential (primary) hypertension: Secondary | ICD-10-CM

## 2019-05-03 DIAGNOSIS — E1121 Type 2 diabetes mellitus with diabetic nephropathy: Secondary | ICD-10-CM | POA: Diagnosis not present

## 2019-05-03 DIAGNOSIS — IMO0002 Reserved for concepts with insufficient information to code with codable children: Secondary | ICD-10-CM

## 2019-05-03 DIAGNOSIS — E1165 Type 2 diabetes mellitus with hyperglycemia: Secondary | ICD-10-CM | POA: Diagnosis not present

## 2019-05-03 DIAGNOSIS — E1142 Type 2 diabetes mellitus with diabetic polyneuropathy: Secondary | ICD-10-CM | POA: Diagnosis not present

## 2019-05-03 LAB — POCT GLYCOSYLATED HEMOGLOBIN (HGB A1C): Hemoglobin A1C: 8.3 % — AB (ref 4.0–5.6)

## 2019-05-03 LAB — POCT GLUCOSE (DEVICE FOR HOME USE): Glucose Fasting, POC: 120 mg/dL — AB (ref 70–99)

## 2019-05-03 MED ORDER — BUDESONIDE-FORMOTEROL FUMARATE 160-4.5 MCG/ACT IN AERO: INHALATION_SPRAY | RESPIRATORY_TRACT | 5 refills | Status: AC

## 2019-05-03 MED ORDER — GLIPIZIDE 5 MG PO TABS: ORAL_TABLET | ORAL | 5 refills | Status: AC

## 2019-05-03 MED ORDER — PANTOPRAZOLE SODIUM 40 MG PO TBEC: 40.0000 mg | DELAYED_RELEASE_TABLET | Freq: Every day | ORAL | 1 refills | Status: DC

## 2019-05-03 MED ORDER — LOSARTAN POTASSIUM 50 MG PO TABS: 50.0000 mg | ORAL_TABLET | Freq: Every day | ORAL | 5 refills | Status: DC

## 2019-05-03 NOTE — Progress Notes (Signed)
Glucose sheets mailed to pt

## 2019-05-03 NOTE — Progress Notes (Signed)
Subjective:    Patient ID: Victoria Vasquez, female    DOB: 05/23/1927, 85 y.o.   MRN: [id number]  Hypertension This is a chronic problem. The current episode started more than 1 year ago. Pertinent negatives include no chest pain or shortness of breath. Risk factors for coronary artery disease include diabetes mellitus, dyslipidemia and post-menopausal state. Treatments tried: cozaar. There are no compliance problems.    Patient does get short of breath with activity although it is not severe more so when she goes up steps probably related to her COPD she is not using the Symbicort on a regular basis I encouraged her to do so gave her handout regarding how to use an inhaler if she needs a spacer she will let us know  Patient is followed by Dr. Dorris Fetch for her hypothyroidism   She does have some days where she feels like she cannot quite swallow as well she wonders if it is the thyroid compressing her esophagus.  I do not find evidence of this on exam or discussion I would state it is more likely esophageal if it becomes progressive or worse or more frequent referral to GI  Continue medication for reflux it will not stop regurgitation but it does reduce the acid  Blood pressure under good control continue current measures no sign of hypotension with standing Results for orders placed or performed in visit on 05/03/19  POCT HgB A1C  Result Value Ref Range   Hemoglobin A1C 8.3 (A) 4.0 - 5.6 %   HbA1c POC (<> result, manual entry)     HbA1c, POC (prediabetic range)     HbA1c, POC (controlled diabetic range)    POCT Glucose (Device for Home Use)  Result Value Ref Range   Glucose Fasting, POC 120 (A) 70 - 99 mg/dL   POC Glucose        Review of Systems  Constitutional: Positive for fatigue. Negative for activity change and appetite change.  HENT: Negative for congestion and rhinorrhea.   Respiratory: Negative for cough and shortness of breath.   Cardiovascular: Positive for leg swelling.  Negative for chest pain.  Gastrointestinal: Negative for abdominal pain and diarrhea.  Endocrine: Negative for polydipsia and polyphagia.  Skin: Negative for color change.  Neurological: Negative for dizziness and weakness.  Psychiatric/Behavioral: Negative for behavioral problems and confusion.       Objective:   Physical Exam Vitals reviewed.  Constitutional:      General: She is not in acute distress. HENT:     Head: Normocephalic and atraumatic.  Eyes:     General:        Right eye: No discharge.        Left eye: No discharge.  Neck:     Trachea: No tracheal deviation.  Cardiovascular:     Rate and Rhythm: Normal rate and regular rhythm.     Heart sounds: Normal heart sounds. No murmur.  Pulmonary:     Effort: Pulmonary effort is normal. No respiratory distress.     Breath sounds: Normal breath sounds.  Lymphadenopathy:     Cervical: No cervical adenopathy.  Skin:    General: Skin is warm and dry.  Neurological:     Mental Status: She is alert.     Coordination: Coordination normal.  Psychiatric:        Behavior: Behavior normal.   Pedal edema on the left leg consistent where she broke her ankle a while back No sign of DVT  Assessment & Plan:  Diabetes subpar control Patient recently ran out of her medications They will check glucose readings periodically and they will send [place] results in a few weeks In addition to this the goal for her A1c is to stay around 8.  Today it is 8.3. I would recommend 1 tablet in the morning half tablet at supper Our goal is to avoid low sugar spells  Patient does have mild neuropathy in her feet she sees a podiatrist on a regular basis she has a sore between her fourth and fifth toe but no sign of gangrene  She does have history of postsurgical hypothyroidism followed by Dr. Dorris Fetch.  Patient states that time she has difficulty swallowing but it is more likely related into esophageal mechanics if she starts choking on food I  would recommend consultation with gastroenterology for stretching  Patient has severe kyphosis and arthritis conditions has not had any falls recently  Patient from a breathing standpoint gets short winded with some activities such as going up steps she does not use her Symbicort every day I encourage her to use the Symbicort every day twice daily.  This would help treat her underlying COPD.  Recheck patient within approximately 6 months  Lab work ordered  25 minutes was spent with the patient.  This statement verifies that 25 minutes was indeed spent with the patient.  More than 50% of this visit-total duration of the visit-was spent in counseling and coordination of care. The issues that the patient came in for today as reflected in the diagnosis (s) please refer to documentation for further details.

## 2019-05-04 ENCOUNTER — Telehealth: Payer: Self-pay | Admitting: Family Medicine

## 2019-05-04 LAB — LIPID PANEL
Chol/HDL Ratio: 5.8 ratio — ABNORMAL HIGH (ref 0.0–4.4)
Cholesterol, Total: 250 mg/dL — ABNORMAL HIGH (ref 100–199)
HDL: 43 mg/dL (ref >39)
LDL Chol Calc (NIH): 175 mg/dL — ABNORMAL HIGH (ref 0–99)
Triglycerides: 174 mg/dL — ABNORMAL HIGH (ref 0–149)
VLDL Cholesterol Cal: 32 mg/dL (ref 5–40)

## 2019-05-04 LAB — COMPREHENSIVE METABOLIC PANEL
ALT: 10 IU/L (ref 0–32)
AST: 15 IU/L (ref 0–40)
Albumin/Globulin Ratio: 2.1 (ref 1.2–2.2)
Albumin: 4.5 g/dL (ref 3.6–4.6)
Alkaline Phosphatase: 105 IU/L (ref 39–117)
BUN/Creatinine Ratio: 17 (ref 12–28)
BUN: 24 mg/dL (ref 8–27)
Bilirubin Total: 0.5 mg/dL (ref 0.0–1.2)
CO2: 24 mmol/L (ref 20–29)
Calcium: 8.8 mg/dL (ref 8.7–10.3)
Chloride: 107 mmol/L — ABNORMAL HIGH (ref 96–106)
Creatinine, Ser: 1.42 mg/dL — ABNORMAL HIGH (ref 0.57–1.00)
GFR calc Af Amer: 38 mL/min/{1.73_m2} — ABNORMAL LOW (ref >59)
GFR calc non Af Amer: 33 mL/min/{1.73_m2} — ABNORMAL LOW (ref >59)
Globulin, Total: 2.1 g/dL (ref 1.5–4.5)
Glucose: 149 mg/dL — ABNORMAL HIGH (ref 65–99)
Potassium: 4.5 mmol/L (ref 3.5–5.2)
Sodium: 142 mmol/L (ref 134–144)
Total Protein: 6.6 g/dL (ref 6.0–8.5)

## 2019-05-04 LAB — CBC WITH DIFFERENTIAL/PLATELET
Basophils Absolute: 0 10*3/uL (ref 0.0–0.2)
Basos: 1 %
EOS (ABSOLUTE): 0.2 10*3/uL (ref 0.0–0.4)
Eos: 3 %
Hematocrit: 38 % (ref 34.0–46.6)
Hemoglobin: 12.8 g/dL (ref 11.1–15.9)
Immature Grans (Abs): 0 10*3/uL (ref 0.0–0.1)
Immature Granulocytes: 0 %
Lymphocytes Absolute: 2.2 10*3/uL (ref 0.7–3.1)
Lymphs: 34 %
MCH: 30 pg (ref 26.6–33.0)
MCHC: 33.7 g/dL (ref 31.5–35.7)
MCV: 89 fL (ref 79–97)
Monocytes Absolute: 1.1 10*3/uL — ABNORMAL HIGH (ref 0.1–0.9)
Monocytes: 17 %
Neutrophils Absolute: 2.9 10*3/uL (ref 1.4–7.0)
Neutrophils: 45 %
Platelets: 188 10*3/uL (ref 150–450)
RBC: 4.27 x10E6/uL (ref 3.77–5.28)
RDW: 13 % (ref 11.7–15.4)
WBC: 6.4 10*3/uL (ref 3.4–10.8)

## 2019-05-04 NOTE — Telephone Encounter (Signed)
ERROR

## 2019-05-06 ENCOUNTER — Encounter: Payer: Self-pay | Admitting: Family Medicine

## 2019-05-07 IMAGING — US US THYROID
1 series · 13 of 25 positions shown · non-contrast
Comparison: None.

CLINICAL DATA: Goiter.

EXAM:
THYROID ULTRASOUND
TECHNIQUE: Ultrasound examination of the thyroid gland and adjacent soft
tissues was performed.

[Series 1: us thyroid · 0.08mm/px · 13 of 59 slices shown]
[im 1/59]
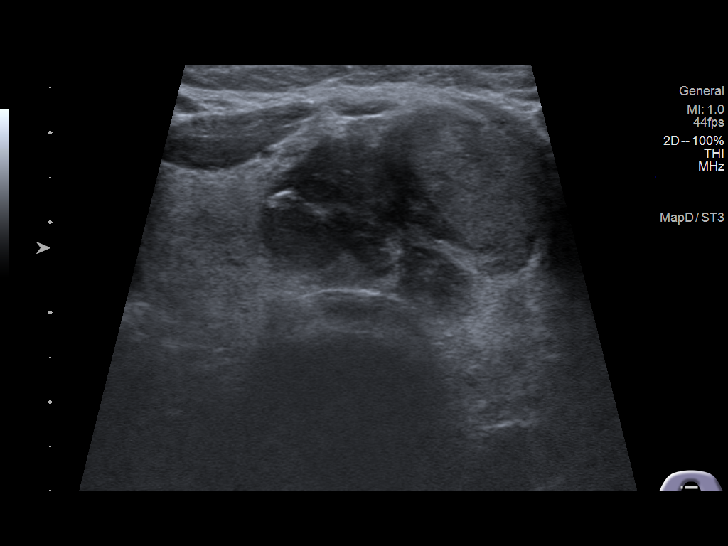
[im 5/59]
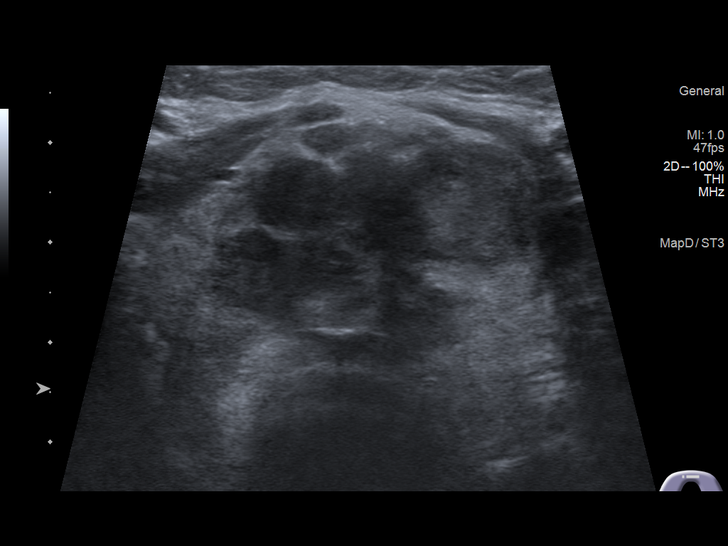
[im 10/59]
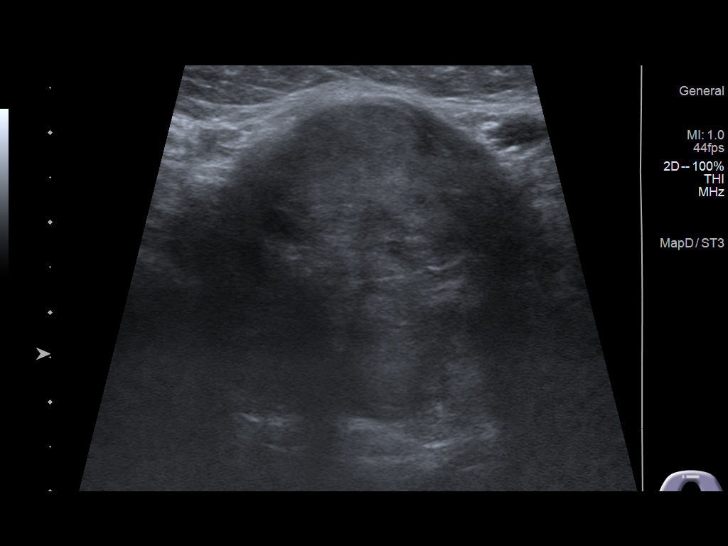
[im 15/59]
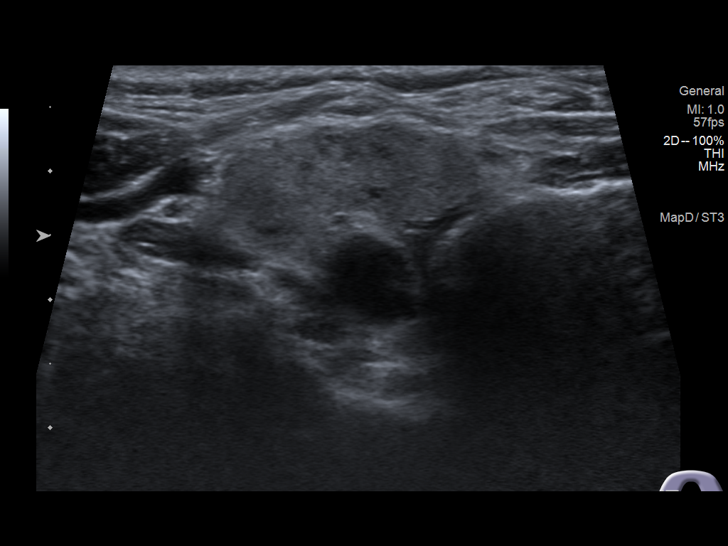
[im 20/59]
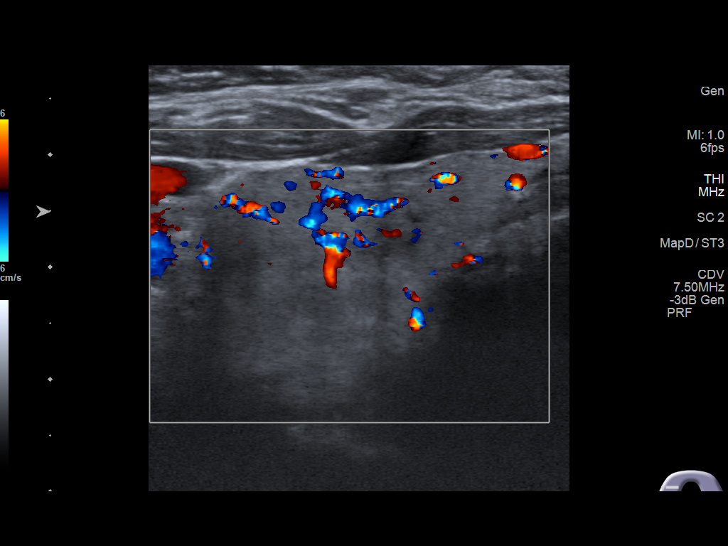
[im 25/59]
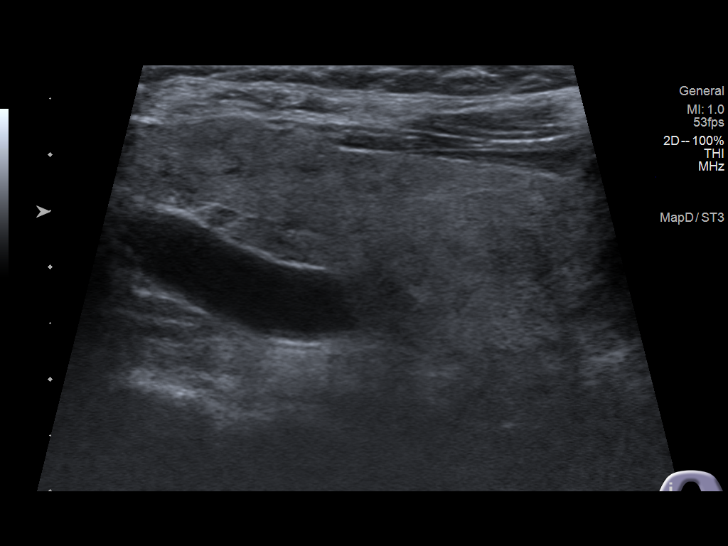
[im 30/59]
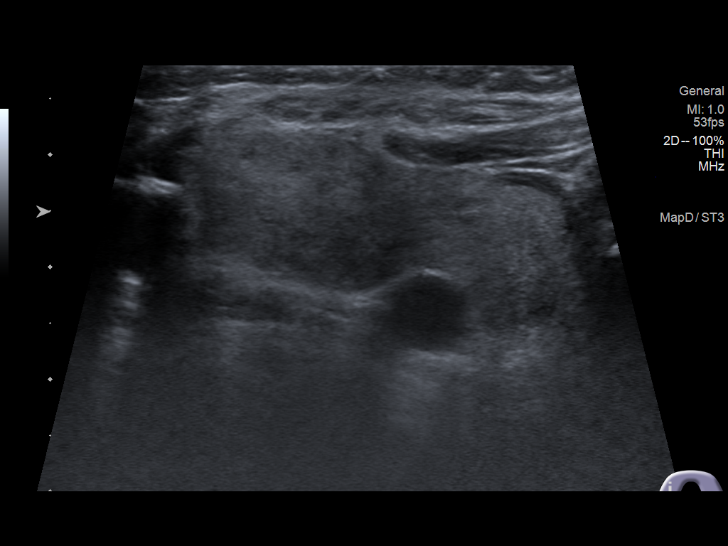
[im 34/59]
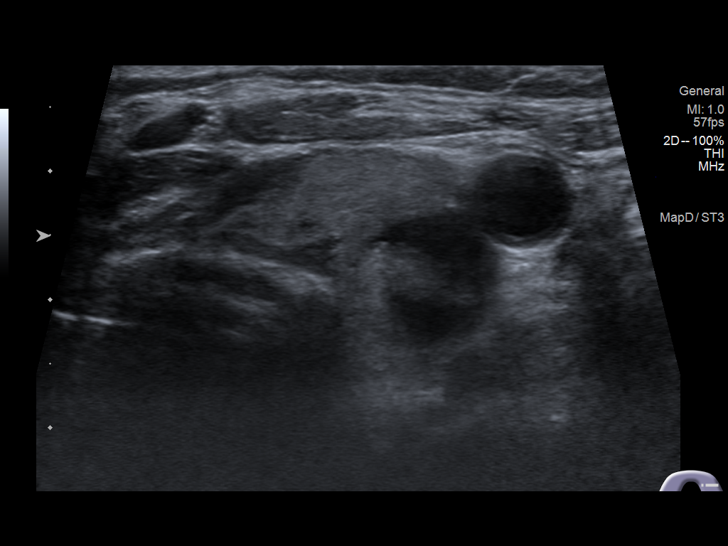
[im 39/59]
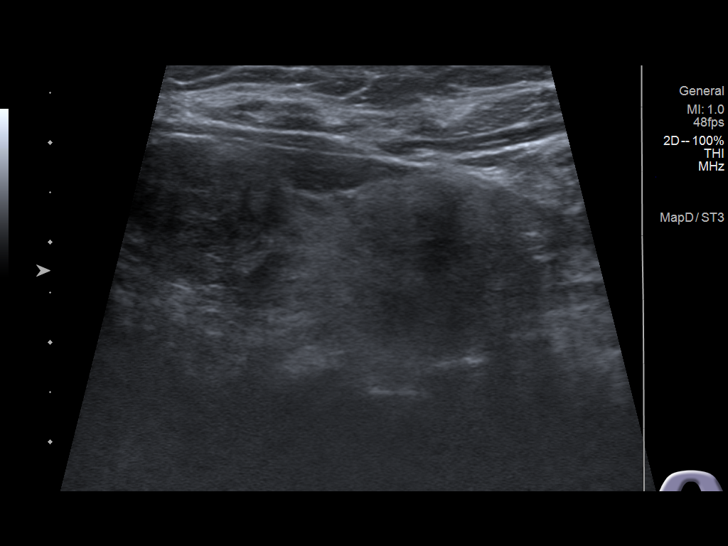
[im 44/59]
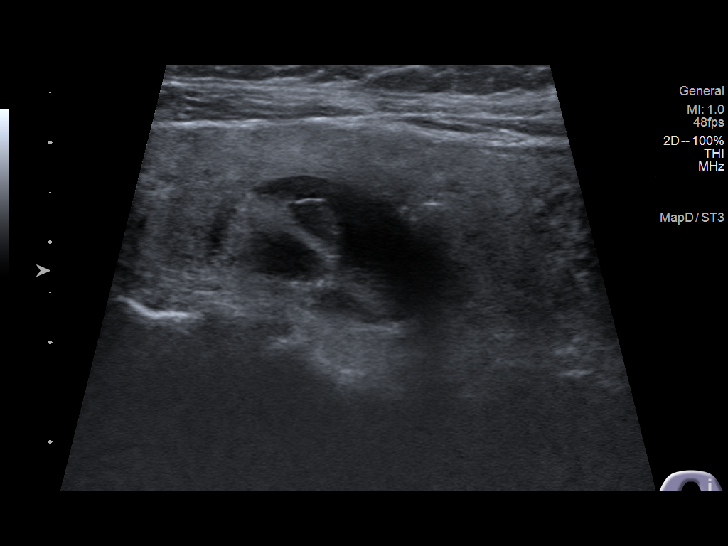
[im 49/59]
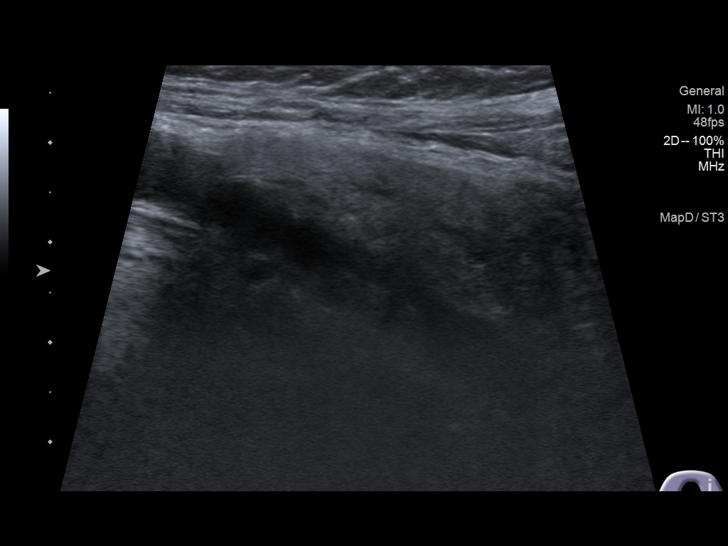
[im 54/59]
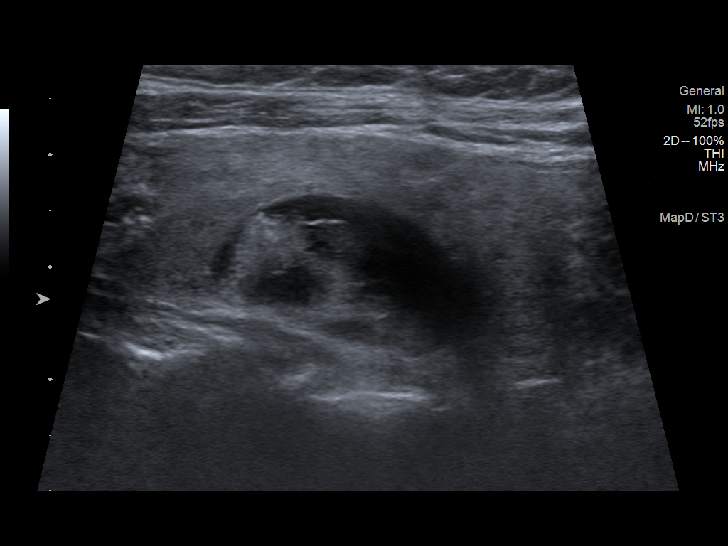
[im 59/59]
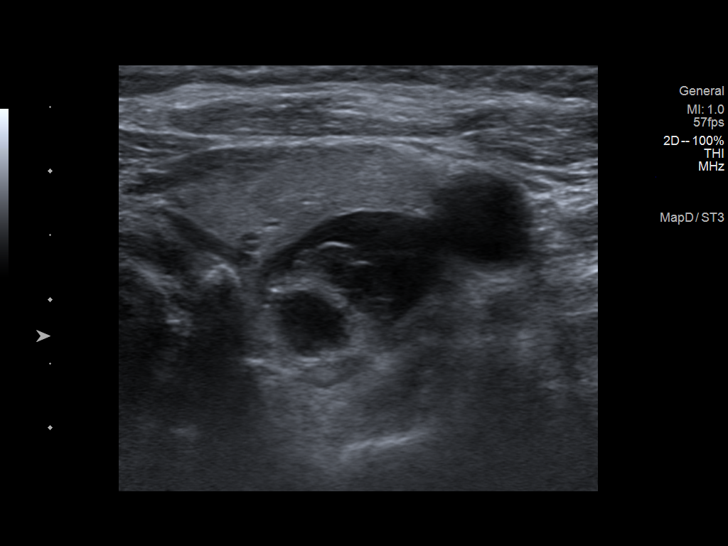

[13 of 25 positions shown; findings below may reference images not displayed]

FINDINGS: Parenchymal Echotexture: Markedly heterogenous

Isthmus: 2.2 cm

Right lobe: 5.1 x 1.8 x 2.4 cm

Left lobe: 5.5 x 2.1 x 2.8 cm

_________________________________________________________

Estimated total number of nodules >/= 1 cm: 2

Number of spongiform nodules >/=  2 cm not described below (TR1): 0

Number of mixed cystic and solid nodules >/= 1.5 cm not described
below (TR2): 0

_________________________________________________________

The thyroid gland is diffusely enlarged, heterogeneous and lobular.
There are 2 discrete thyroid nodules identified.

Nodule # 1:

Location: Isthmus; Mid

Maximum size: 4.6 cm; Other 2 dimensions: 3.7 x 3.1 cm

Composition: solid/almost completely solid (2)

Echogenicity: hypoechoic (2)

Shape: not taller-than-wide (0)

Margins: ill-defined (0)

Echogenic foci: none (0)

ACR TI-RADS total points: 4.

ACR TI-RADS risk category: TR4 (4-6 points).

ACR TI-RADS recommendations:

**Given size (>/= 1.5 cm) and appearance, fine needle aspiration of
this moderately suspicious nodule should be considered based on
TI-RADS criteria.

_________________________________________________________

Nodule # 2:

Location: Left; Superior

Maximum size: 2.4 cm; Other 2 dimensions: 1.6 x 1.5 cm

Composition: mixed cystic and solid (1)

Echogenicity: isoechoic (1)

Shape: not taller-than-wide (0)

Margins: smooth (0)

Echogenic foci: none (0)

ACR TI-RADS total points: 2.

ACR TI-RADS risk category: TR2 (2 points).

ACR TI-RADS recommendations:

This nodule does NOT meet TI-RADS criteria for biopsy or dedicated
follow-up.

_________________________________________________________
IMPRESSION: 1. Diffusely enlarged, heterogeneous and lobular thyroid gland most
consistent with goiter.
2. A 4.6 cm TI-RADS category 4 nodule (labeled # 1) in the thyroid
isthmus meets criteria for consideration of fine-needle aspiration
biopsy.

The above is in keeping with the ACR TI-RADS recommendations - [HOSPITAL] 8304;[DATE].

## 2019-05-16 ENCOUNTER — Other Ambulatory Visit: Payer: Self-pay | Admitting: Family Medicine

## 2019-05-25 ENCOUNTER — Other Ambulatory Visit: Payer: Self-pay | Admitting: Family Medicine

## 2019-06-06 ENCOUNTER — Other Ambulatory Visit: Payer: Self-pay | Admitting: Family Medicine

## 2019-06-08 ENCOUNTER — Telehealth: Payer: Self-pay | Admitting: Family Medicine

## 2019-06-08 NOTE — Telephone Encounter (Signed)
Patient dropped out glucose reading for you to review in your folder .

## 2019-06-09 ENCOUNTER — Other Ambulatory Visit: Payer: Self-pay

## 2019-06-09 ENCOUNTER — Observation Stay (HOSPITAL_COMMUNITY)
Admission: EM | Admit: 2019-06-09 | Discharge: 2019-06-10 | Disposition: A | Discharge status: Home / Self Care | Payer: PPO | Attending: Family Medicine | Admitting: Family Medicine

## 2019-06-09 ENCOUNTER — Encounter (HOSPITAL_COMMUNITY): Payer: Self-pay | Admitting: Emergency Medicine

## 2019-06-09 ENCOUNTER — Inpatient Hospital Stay (HOSPITAL_COMMUNITY): Payer: PPO

## 2019-06-09 ENCOUNTER — Emergency Department (HOSPITAL_COMMUNITY): Payer: PPO

## 2019-06-09 ENCOUNTER — Telehealth: Payer: Self-pay | Admitting: *Deleted

## 2019-06-09 ENCOUNTER — Other Ambulatory Visit: Payer: Self-pay | Admitting: "Endocrinology

## 2019-06-09 DIAGNOSIS — Z79899 Other long term (current) drug therapy: Secondary | ICD-10-CM | POA: Insufficient documentation

## 2019-06-09 DIAGNOSIS — I160 Hypertensive urgency: Secondary | ICD-10-CM | POA: Diagnosis present

## 2019-06-09 DIAGNOSIS — Z888 Allergy status to other drugs, medicaments and biological substances status: Secondary | ICD-10-CM | POA: Insufficient documentation

## 2019-06-09 DIAGNOSIS — K449 Diaphragmatic hernia without obstruction or gangrene: Secondary | ICD-10-CM | POA: Diagnosis not present

## 2019-06-09 DIAGNOSIS — Z20822 Contact with and (suspected) exposure to covid-19: Secondary | ICD-10-CM | POA: Diagnosis not present

## 2019-06-09 DIAGNOSIS — Z7951 Long term (current) use of inhaled steroids: Secondary | ICD-10-CM | POA: Insufficient documentation

## 2019-06-09 DIAGNOSIS — I371 Nonrheumatic pulmonary valve insufficiency: Secondary | ICD-10-CM | POA: Diagnosis not present

## 2019-06-09 DIAGNOSIS — M1990 Unspecified osteoarthritis, unspecified site: Secondary | ICD-10-CM | POA: Diagnosis not present

## 2019-06-09 DIAGNOSIS — I4510 Unspecified right bundle-branch block: Secondary | ICD-10-CM | POA: Diagnosis not present

## 2019-06-09 DIAGNOSIS — R0689 Other abnormalities of breathing: Secondary | ICD-10-CM | POA: Diagnosis present

## 2019-06-09 DIAGNOSIS — N183 Chronic kidney disease, stage 3 unspecified: Secondary | ICD-10-CM | POA: Insufficient documentation

## 2019-06-09 DIAGNOSIS — C73 Malignant neoplasm of thyroid gland: Secondary | ICD-10-CM | POA: Diagnosis not present

## 2019-06-09 DIAGNOSIS — Z853 Personal history of malignant neoplasm of breast: Secondary | ICD-10-CM | POA: Insufficient documentation

## 2019-06-09 DIAGNOSIS — E785 Hyperlipidemia, unspecified: Secondary | ICD-10-CM | POA: Insufficient documentation

## 2019-06-09 DIAGNOSIS — Z85850 Personal history of malignant neoplasm of thyroid: Secondary | ICD-10-CM | POA: Diagnosis not present

## 2019-06-09 DIAGNOSIS — Z8719 Personal history of other diseases of the digestive system: Secondary | ICD-10-CM | POA: Insufficient documentation

## 2019-06-09 DIAGNOSIS — I129 Hypertensive chronic kidney disease with stage 1 through stage 4 chronic kidney disease, or unspecified chronic kidney disease: Secondary | ICD-10-CM | POA: Insufficient documentation

## 2019-06-09 DIAGNOSIS — I700 Atherosclerosis of aorta: Secondary | ICD-10-CM | POA: Diagnosis not present

## 2019-06-09 DIAGNOSIS — E1122 Type 2 diabetes mellitus with diabetic chronic kidney disease: Secondary | ICD-10-CM | POA: Insufficient documentation

## 2019-06-09 DIAGNOSIS — J449 Chronic obstructive pulmonary disease, unspecified: Secondary | ICD-10-CM | POA: Diagnosis not present

## 2019-06-09 DIAGNOSIS — C50912 Malignant neoplasm of unspecified site of left female breast: Secondary | ICD-10-CM | POA: Diagnosis not present

## 2019-06-09 DIAGNOSIS — E890 Postprocedural hypothyroidism: Secondary | ICD-10-CM | POA: Diagnosis present

## 2019-06-09 DIAGNOSIS — E1165 Type 2 diabetes mellitus with hyperglycemia: Secondary | ICD-10-CM | POA: Diagnosis not present

## 2019-06-09 DIAGNOSIS — R0902 Hypoxemia: Secondary | ICD-10-CM | POA: Insufficient documentation

## 2019-06-09 DIAGNOSIS — K219 Gastro-esophageal reflux disease without esophagitis: Secondary | ICD-10-CM | POA: Insufficient documentation

## 2019-06-09 DIAGNOSIS — M419 Scoliosis, unspecified: Secondary | ICD-10-CM | POA: Insufficient documentation

## 2019-06-09 DIAGNOSIS — J9811 Atelectasis: Secondary | ICD-10-CM | POA: Diagnosis not present

## 2019-06-09 DIAGNOSIS — R0602 Shortness of breath: Secondary | ICD-10-CM | POA: Diagnosis not present

## 2019-06-09 DIAGNOSIS — Z9049 Acquired absence of other specified parts of digestive tract: Secondary | ICD-10-CM | POA: Insufficient documentation

## 2019-06-09 DIAGNOSIS — Z794 Long term (current) use of insulin: Secondary | ICD-10-CM | POA: Insufficient documentation

## 2019-06-09 DIAGNOSIS — C50012 Malignant neoplasm of nipple and areola, left female breast: Secondary | ICD-10-CM | POA: Diagnosis not present

## 2019-06-09 DIAGNOSIS — R0600 Dyspnea, unspecified: Secondary | ICD-10-CM | POA: Diagnosis present

## 2019-06-09 DIAGNOSIS — H3530 Unspecified macular degeneration: Secondary | ICD-10-CM | POA: Insufficient documentation

## 2019-06-09 DIAGNOSIS — I2510 Atherosclerotic heart disease of native coronary artery without angina pectoris: Secondary | ICD-10-CM | POA: Diagnosis not present

## 2019-06-09 DIAGNOSIS — J90 Pleural effusion, not elsewhere classified: Principal | ICD-10-CM | POA: Diagnosis present

## 2019-06-09 DIAGNOSIS — Z7982 Long term (current) use of aspirin: Secondary | ICD-10-CM | POA: Insufficient documentation

## 2019-06-09 DIAGNOSIS — IMO0002 Reserved for concepts with insufficient information to code with codable children: Secondary | ICD-10-CM | POA: Diagnosis present

## 2019-06-09 LAB — ECHOCARDIOGRAM COMPLETE
Height: 63 in
Weight: 2832 oz

## 2019-06-09 LAB — CBC WITH DIFFERENTIAL/PLATELET
Abs Immature Granulocytes: 0.03 10*3/uL (ref 0.00–0.07)
Basophils Absolute: 0 10*3/uL (ref 0.0–0.1)
Basophils Relative: 1 %
Eosinophils Absolute: 0.1 10*3/uL (ref 0.0–0.5)
Eosinophils Relative: 2 %
HCT: 41.4 % (ref 36.0–46.0)
Hemoglobin: 13 g/dL (ref 12.0–15.0)
Immature Granulocytes: 1 %
Lymphocytes Relative: 30 %
Lymphs Abs: 1.8 10*3/uL (ref 0.7–4.0)
MCH: 29.8 pg (ref 26.0–34.0)
MCHC: 31.4 g/dL (ref 30.0–36.0)
MCV: 95 fL (ref 80.0–100.0)
Monocytes Absolute: 0.9 10*3/uL (ref 0.1–1.0)
Monocytes Relative: 14 %
Neutro Abs: 3.1 10*3/uL (ref 1.7–7.7)
Neutrophils Relative %: 52 %
Platelets: 180 10*3/uL (ref 150–400)
RBC: 4.36 MIL/uL (ref 3.87–5.11)
RDW: 14.2 % (ref 11.5–15.5)
WBC: 5.9 10*3/uL (ref 4.0–10.5)
nRBC: 0 % (ref 0.0–0.2)

## 2019-06-09 LAB — COMPREHENSIVE METABOLIC PANEL
ALT: 14 U/L (ref 0–44)
AST: 17 U/L (ref 15–41)
Albumin: 4.2 g/dL (ref 3.5–5.0)
Alkaline Phosphatase: 84 U/L (ref 38–126)
Anion gap: 9 (ref 5–15)
BUN: 23 mg/dL (ref 8–23)
CO2: 27 mmol/L (ref 22–32)
Calcium: 9 mg/dL (ref 8.9–10.3)
Chloride: 104 mmol/L (ref 98–111)
Creatinine, Ser: 1.21 mg/dL — ABNORMAL HIGH (ref 0.44–1.00)
GFR calc Af Amer: 46 mL/min — ABNORMAL LOW (ref >60)
GFR calc non Af Amer: 40 mL/min — ABNORMAL LOW (ref >60)
Glucose, Bld: 175 mg/dL — ABNORMAL HIGH (ref 70–99)
Potassium: 4.3 mmol/L (ref 3.5–5.1)
Sodium: 140 mmol/L (ref 135–145)
Total Bilirubin: 0.8 mg/dL (ref 0.3–1.2)
Total Protein: 6.7 g/dL (ref 6.5–8.1)

## 2019-06-09 LAB — TROPONIN I (HIGH SENSITIVITY)
Troponin I (High Sensitivity): 16 ng/L (ref <18)
Troponin I (High Sensitivity): 16 ng/L (ref <18)

## 2019-06-09 LAB — ALBUMIN, PLEURAL OR PERITONEAL FLUID: Albumin, Fluid: 3.2 g/dL

## 2019-06-09 LAB — PROTEIN, PLEURAL OR PERITONEAL FLUID: Total protein, fluid: 4.5 g/dL

## 2019-06-09 LAB — TSH: TSH: 0.445 u[IU]/mL (ref 0.350–4.500)

## 2019-06-09 LAB — BODY FLUID CELL COUNT WITH DIFFERENTIAL
Eos, Fluid: 0 %
Lymphs, Fluid: 74 %
Monocyte-Macrophage-Serous Fluid: 12 % — ABNORMAL LOW (ref 50–90)
Neutrophil Count, Fluid: 14 % (ref 0–25)
Total Nucleated Cell Count, Fluid: 1819 cu mm — ABNORMAL HIGH (ref 0–1000)

## 2019-06-09 LAB — BRAIN NATRIURETIC PEPTIDE: B Natriuretic Peptide: 169 pg/mL — ABNORMAL HIGH (ref 0.0–100.0)

## 2019-06-09 LAB — GRAM STAIN

## 2019-06-09 LAB — LACTATE DEHYDROGENASE: LDH: 204 U/L — ABNORMAL HIGH (ref 98–192)

## 2019-06-09 LAB — GLUCOSE, PLEURAL OR PERITONEAL FLUID: Glucose, Fluid: 160 mg/dL

## 2019-06-09 LAB — RESPIRATORY PANEL BY RT PCR (FLU A&B, COVID)
Influenza A by PCR: NEGATIVE
Influenza B by PCR: NEGATIVE
SARS Coronavirus 2 by RT PCR: NEGATIVE

## 2019-06-09 LAB — GLUCOSE, CAPILLARY: Glucose-Capillary: 285 mg/dL — ABNORMAL HIGH (ref 70–99)

## 2019-06-09 LAB — LACTATE DEHYDROGENASE, PLEURAL OR PERITONEAL FLUID: LD, Fluid: 124 U/L — ABNORMAL HIGH (ref 3–23)

## 2019-06-09 LAB — CBG MONITORING, ED: Glucose-Capillary: 138 mg/dL — ABNORMAL HIGH (ref 70–99)

## 2019-06-09 MED ORDER — LABETALOL HCL 5 MG/ML IV SOLN
20.0000 mg | Freq: Once | INTRAVENOUS | Status: DC
  Filled 2019-06-09: qty 4

## 2019-06-09 MED ORDER — LEVOTHYROXINE SODIUM 112 MCG PO TABS
112.0000 ug | ORAL_TABLET | Freq: Every day | ORAL | Status: DC
  Administered 2019-06-10: 112 ug via ORAL
  Filled 2019-06-09 (×2): qty 1

## 2019-06-09 MED ORDER — ACETAMINOPHEN 325 MG PO TABS
650.0000 mg | ORAL_TABLET | Freq: Four times a day (QID) | ORAL | Status: DC | PRN
  Administered 2019-06-10: 650 mg via ORAL
  Filled 2019-06-09 (×2): qty 2

## 2019-06-09 MED ORDER — FLUTICASONE PROPIONATE 50 MCG/ACT NA SUSP
2.0000 | Freq: Every day | NASAL | Status: DC | PRN
  Filled 2019-06-09: qty 16

## 2019-06-09 MED ORDER — MOMETASONE FURO-FORMOTEROL FUM 200-5 MCG/ACT IN AERO
2.0000 | INHALATION_SPRAY | Freq: Two times a day (BID) | RESPIRATORY_TRACT | Status: DC
  Administered 2019-06-09 – 2019-06-10 (×2): 2 via RESPIRATORY_TRACT
  Filled 2019-06-09: qty 8.8

## 2019-06-09 MED ORDER — LATANOPROST 0.005 % OP SOLN
1.0000 [drp] | Freq: Every day | OPHTHALMIC | Status: DC
  Administered 2019-06-09: 1 [drp] via OPHTHALMIC
  Filled 2019-06-09 (×2): qty 2.5

## 2019-06-09 MED ORDER — PANTOPRAZOLE SODIUM 40 MG PO TBEC
40.0000 mg | DELAYED_RELEASE_TABLET | Freq: Every day | ORAL | Status: DC
  Administered 2019-06-09 – 2019-06-10 (×2): 40 mg via ORAL
  Filled 2019-06-09 (×2): qty 1

## 2019-06-09 MED ORDER — AMLODIPINE BESYLATE 5 MG PO TABS
10.0000 mg | ORAL_TABLET | Freq: Every day | ORAL | Status: DC
  Administered 2019-06-09: 10 mg via ORAL
  Filled 2019-06-09: qty 2

## 2019-06-09 MED ORDER — ISOSORBIDE MONONITRATE ER 60 MG PO TB24
30.0000 mg | ORAL_TABLET | Freq: Every day | ORAL | Status: DC
  Administered 2019-06-09: 30 mg via ORAL
  Filled 2019-06-09 (×3): qty 1

## 2019-06-09 MED ORDER — ACETAMINOPHEN 650 MG RE SUPP: 650.0000 mg | Freq: Four times a day (QID) | RECTAL | Status: DC | PRN

## 2019-06-09 MED ORDER — ALBUTEROL SULFATE HFA 108 (90 BASE) MCG/ACT IN AERS: 2.0000 | INHALATION_SPRAY | RESPIRATORY_TRACT | Status: DC | PRN

## 2019-06-09 MED ORDER — GUAIFENESIN ER 600 MG PO TB12
600.0000 mg | ORAL_TABLET | Freq: Two times a day (BID) | ORAL | Status: DC
  Administered 2019-06-09 – 2019-06-10 (×2): 600 mg via ORAL
  Filled 2019-06-09 (×3): qty 1

## 2019-06-09 MED ORDER — SODIUM CHLORIDE 0.9 % IV SOLN: 250.0000 mL | INTRAVENOUS | Status: DC | PRN

## 2019-06-09 MED ORDER — TRAZODONE HCL 50 MG PO TABS: 50.0000 mg | ORAL_TABLET | Freq: Every evening | ORAL | Status: DC | PRN

## 2019-06-09 MED ORDER — ASPIRIN 81 MG PO CHEW
81.0000 mg | CHEWABLE_TABLET | Freq: Every day | ORAL | Status: DC
  Administered 2019-06-10: 81 mg via ORAL
  Filled 2019-06-09: qty 1

## 2019-06-09 MED ORDER — SODIUM CHLORIDE 0.9% FLUSH: 3.0000 mL | INTRAVENOUS | Status: DC | PRN

## 2019-06-09 MED ORDER — LABETALOL HCL 5 MG/ML IV SOLN: 20.0000 mg | Freq: Once | INTRAVENOUS | Status: DC

## 2019-06-09 MED ORDER — OCUVITE-LUTEIN PO CAPS
1.0000 | ORAL_CAPSULE | Freq: Two times a day (BID) | ORAL | Status: DC
  Administered 2019-06-09 – 2019-06-10 (×2): 1 via ORAL
  Filled 2019-06-09 (×4): qty 1

## 2019-06-09 MED ORDER — POLYETHYLENE GLYCOL 3350 17 G PO PACK: 17.0000 g | PACK | Freq: Every day | ORAL | Status: DC | PRN

## 2019-06-09 MED ORDER — LABETALOL HCL 5 MG/ML IV SOLN: 10.0000 mg | INTRAVENOUS | Status: DC | PRN

## 2019-06-09 MED ORDER — INSULIN ASPART 100 UNIT/ML ~~LOC~~ SOLN
0.0000 [IU] | Freq: Three times a day (TID) | SUBCUTANEOUS | Status: DC
  Administered 2019-06-09: 2 [IU] via SUBCUTANEOUS
  Administered 2019-06-10: 5 [IU] via SUBCUTANEOUS
  Administered 2019-06-10: 3 [IU] via SUBCUTANEOUS
  Filled 2019-06-09: qty 1

## 2019-06-09 MED ORDER — SODIUM CHLORIDE 0.9% FLUSH
3.0000 mL | Freq: Two times a day (BID) | INTRAVENOUS | Status: DC
  Administered 2019-06-09 – 2019-06-10 (×2): 3 mL via INTRAVENOUS

## 2019-06-09 MED ORDER — ACETAMINOPHEN 500 MG PO TABS
500.0000 mg | ORAL_TABLET | Freq: Four times a day (QID) | ORAL | Status: DC | PRN
  Administered 2019-06-09: 500 mg via ORAL
  Filled 2019-06-09: qty 1

## 2019-06-09 MED ORDER — FUROSEMIDE 10 MG/ML IJ SOLN
40.0000 mg | Freq: Every day | INTRAMUSCULAR | Status: DC
  Administered 2019-06-09 – 2019-06-10 (×2): 40 mg via INTRAVENOUS
  Filled 2019-06-09 (×2): qty 4

## 2019-06-09 MED ORDER — LABETALOL HCL 5 MG/ML IV SOLN
20.0000 mg | Freq: Once | INTRAVENOUS | Status: AC
  Administered 2019-06-09: 20 mg via INTRAVENOUS
  Filled 2019-06-09: qty 4

## 2019-06-09 MED ORDER — INSULIN ASPART 100 UNIT/ML ~~LOC~~ SOLN
0.0000 [IU] | Freq: Every day | SUBCUTANEOUS | Status: DC
  Administered 2019-06-09: 3 [IU] via SUBCUTANEOUS

## 2019-06-09 MED ORDER — ONDANSETRON HCL 4 MG PO TABS: 4.0000 mg | ORAL_TABLET | Freq: Four times a day (QID) | ORAL | Status: DC | PRN

## 2019-06-09 MED ORDER — HEPARIN SODIUM (PORCINE) 5000 UNIT/ML IJ SOLN
5000.0000 [IU] | Freq: Three times a day (TID) | INTRAMUSCULAR | Status: DC
  Administered 2019-06-09 – 2019-06-10 (×4): 5000 [IU] via SUBCUTANEOUS
  Filled 2019-06-09 (×4): qty 1

## 2019-06-09 MED ORDER — IOHEXOL 300 MG/ML  SOLN
60.0000 mL | Freq: Once | INTRAMUSCULAR | Status: AC | PRN
  Administered 2019-06-09: 60 mL via INTRAVENOUS

## 2019-06-09 MED ORDER — LOSARTAN POTASSIUM 50 MG PO TABS
50.0000 mg | ORAL_TABLET | Freq: Every day | ORAL | Status: DC
  Administered 2019-06-09: 50 mg via ORAL
  Filled 2019-06-09: qty 2
  Filled 2019-06-09: qty 1

## 2019-06-09 MED ORDER — ONDANSETRON HCL 4 MG/2ML IJ SOLN: 4.0000 mg | Freq: Four times a day (QID) | INTRAMUSCULAR | Status: DC | PRN

## 2019-06-09 MED ORDER — NICARDIPINE HCL IN NACL 20-0.86 MG/200ML-% IV SOLN
3.0000 mg/h | INTRAVENOUS | Status: DC
  Filled 2019-06-09: qty 200

## 2019-06-09 NOTE — ED Provider Notes (Signed)
[facility] EMERGENCY DEPARTMENT Provider Note   CSN: [id number] Arrival date & time: 06/09/19  1030     History Chief Complaint  Patient presents with  . Shortness of Breath    Victoria Vasquez is a 86 y.o. female.  Patient complains of shortness of breath.  This been going on number days.  It is getting worse  The history is provided by the patient. No language interpreter was used.  Shortness of Breath Severity:  Moderate Onset quality:  Sudden Timing:  Constant Progression:  Worsening Chronicity:  New Context: activity   Relieved by:  Nothing Worsened by:  Nothing Ineffective treatments:  None tried Associated symptoms: no abdominal pain, no chest pain, no cough, no headaches and no rash        Past Medical History:  Diagnosis Date  . Arthritis   . Asthma   . Cancer ([place])    BREAST CANCER LEFT BREAST  . Chest pain    Evaluation prior to 2003 reportedly including cath-records never located; Echo in 2009: Mild LVH, LAE2, nl EF, moderate LAE, MR1-2  . Chronic kidney disease   . COPD (chronic obstructive pulmonary disease) (HCC)    Exertional dyspnea; PFTs in 2002: Moderate small airway obstruction, mildly reduced lung volumes and DLCO, nl ABG  . Diabetes mellitus, type II ([place])   . Dyspnea   . Family history of adverse reaction to anesthesia    daugher has postop N&V  . Fatigue   . GERD (gastroesophageal reflux disease)   . Hyperlipidemia   . Hypertension   . Legally blind 1 1 2015  . Macular degeneration of both eyes 1 1 2015  . Parotid mass 2003   Right-2003  . Scoliosis   . Shingles     Patient Active Problem List   Diagnosis Date Noted  . Postsurgical hypothyroidism 12/10/2018  . 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 Multinodular goiter 06/17/2018  . Paget's disease and intraductal carcinoma of breast, left ([place]) 03/18/2018  . Primary osteoarthritis of right knee 05/29/2016  . Glaucoma 07/05/2015  .  Diabetic nephropathy with proteinuria ([place]) 09/04/2014  . Macular degeneration 08/30/2014  . Cough 05/01/2014  . Somnolence 10/12/2012  . Hypokalemia 09/29/2012  . Syncope 09/14/2012  . Dyspnea on exertion 09/08/2012  . Uncontrolled type 2 diabetes mellitus with stage 3 chronic kidney disease ([place])   . Hypertension   . Hyperlipidemia   . Chest pain   . GERD (gastroesophageal reflux disease) 02/11/2012    Past Surgical History:  Procedure Laterality Date  . APPENDECTOMY  1951  . BREAST BIOPSY Left 10/05/2017   Procedure: BREAST BIOPSY;  Surgeon: Aviva Signs, MD;  Location: [facility];  Service: General;  Laterality: Left;  . BREAST LUMPECTOMY Left 03/18/2018  . BREAST LUMPECTOMY Left 03/18/2018   Procedure: LEFT BREAST CENTRAL  LUMPECTOMY;  Surgeon: Rolm Bookbinder, MD;  Location: [facility];  Service: General;  Laterality: Left;  . CARDIAC CATHETERIZATION  1999  . CHOLECYSTECTOMY  2003  . COLONOSCOPY  04/16/2006   [place]-Normal colon.  Exam limited due to retained particulate matter in the lumen of the colon, polyps less than 1 cm would have been easily missed.  Otherwise, no polyps, masses, inflammatory changes or vascular ectasia seen/ Normal retroflexed view of the rectum..  recommend repeat exam at 03/2011  . ORIF  1996  . ORIF ANKLE FRACTURE  1992  . THYROIDECTOMY N/A 11/03/2018   Procedure: TOTAL THYROIDECTOMY;  Surgeon: Michael Boston, MD;  Location: [facility];  Service: General;  Laterality: N/A;     OB History   No obstetric history on file.     Family History  Problem Relation Age of Onset  . Stomach cancer Mother   . Alzheimer's disease Sister   . Colon cancer Neg Hx     Social History   Tobacco Use  . Smoking status: Passive Smoke Exposure - Never Smoker  . Smokeless tobacco: Never Used  Substance Use Topics  . Alcohol use: No  . Drug use: No    Home Medications Prior to Admission medications   Medication Sig Start Date End Date Taking? Authorizing Provider    acetaminophen (TYLENOL) 500 MG tablet Take 500 mg by mouth every 6 (six) hours as needed for moderate pain or headache.   Yes [provider]  albuterol (VENTOLIN HFA) 108 (90 Base) MCG/ACT inhaler INHALE 2 PUFFS EVERY 4 HOURS AS NEEDED. 06/06/19  Yes Kathyrn Drown, MD  aspirin 81 MG tablet Take 81 mg by mouth daily.   Yes [provider]  budesonide-formoterol (SYMBICORT) 160-4.5 MCG/ACT inhaler USE 2 PUFFS TWICE DAILY FOR ASTHMA. RINSE MOUTH AFTER USE. 05/03/19  Yes Luking, Elayne Snare, MD  fluticasone (FLONASE) 50 MCG/ACT nasal spray Place 2 sprays into both nostrils daily. Patient taking differently: Place 2 sprays into both nostrils daily as needed for allergies.  03/30/18  Yes Luking, Scott A, MD  glipiZIDE (GLUCOTROL) 5 MG tablet TAKE 1 TABLET BY MOUTH EACH MORNING AND 1/2 TABLET AT SUPPER 05/03/19  Yes Luking, Scott A, MD  latanoprost (XALATAN) 0.005 % ophthalmic solution Place 1 drop into both eyes at bedtime.    Yes [provider]  levothyroxine (SYNTHROID) 112 MCG tablet TAKE ONE TABLET BY MOUTH EVERY DAY BEFORE BREAKFAST 03/10/19  Yes Nida, Marella Chimes, MD  losartan (COZAAR) 50 MG tablet Take 1 tablet (50 mg total) by mouth daily. 05/03/19  Yes Luking, Elayne Snare, MD  Multiple Vitamins-Minerals (PRESERVISION AREDS 2+MULTI VIT PO) Take 1 capsule by mouth 2 (two) times daily.    Yes [provider]  pantoprazole (PROTONIX) 40 MG tablet Take 1 tablet (40 mg total) by mouth daily. 05/03/19  Yes Kathyrn Drown, MD  famotidine (PEPCID) 20 MG tablet Take one tablet by mouth each night Patient not taking: Reported on 05/03/2019 11/18/18   Kathyrn Drown, MD  traMADol (ULTRAM) 50 MG tablet Take 1-2 tablets (50-100 mg total) by mouth every 6 (six) hours as needed for moderate pain or severe pain. Patient not taking: Reported on 05/03/2019 11/03/18   Michael Boston, MD    Allergies    Lisinopril and Statins  Review of Systems   Review of Systems   Constitutional: Negative for appetite change and fatigue.  HENT: Negative for congestion, ear discharge and sinus pressure.   Eyes: Negative for discharge.  Respiratory: Positive for shortness of breath. Negative for cough.   Cardiovascular: Negative for chest pain.  Gastrointestinal: Negative for abdominal pain and diarrhea.  Genitourinary: Negative for frequency and hematuria.  Musculoskeletal: Negative for back pain.  Skin: Negative for rash.  Neurological: Negative for seizures and headaches.  Psychiatric/Behavioral: Negative for hallucinations.    Physical Exam Updated Vital Signs BP (!) 230/105 (BP Location: Right Arm)   Pulse 64   Temp 97.8 F (36.6 C) (Oral)   Resp 20   Ht 5\' 3"  (1.6 m)   Wt 80.3 kg   SpO2 98%   BMI 31.35 kg/m   Physical Exam Vitals  and nursing note reviewed.  Constitutional:      Appearance: She is well-developed.  HENT:     Head: Normocephalic.  Eyes:     General: No scleral icterus.    Conjunctiva/sclera: Conjunctivae normal.     Pupils: Pupils are equal, round, and reactive to light.  Neck:     Thyroid: No thyromegaly.  Cardiovascular:     Rate and Rhythm: Normal rate and regular rhythm.     Heart sounds: No murmur. No friction rub. No gallop.   Pulmonary:     Breath sounds: No stridor. No wheezing or rales.     Comments: Decreased breath sounds right side Chest:     Chest wall: No tenderness.  Abdominal:     General: There is no distension.     Tenderness: There is no abdominal tenderness. There is no rebound.  Musculoskeletal:        General: Normal range of motion.     Cervical back: Neck supple.  Lymphadenopathy:     Cervical: No cervical adenopathy.  Skin:    Findings: No erythema or rash.  Neurological:     Mental Status: She is alert and oriented to person, place, and time.     Motor: No abnormal muscle tone.     Coordination: Coordination normal.  Psychiatric:        Behavior: Behavior normal.     ED Results /  Procedures / Treatments   Labs (all labs ordered are listed, but only abnormal results are displayed) Labs Reviewed  COMPREHENSIVE METABOLIC PANEL - Abnormal; Notable for the following components:      Result Value   Glucose, Bld 175 (*)    Creatinine, Ser 1.21 (*)    GFR calc non Af Amer 40 (*)    GFR calc Af Amer 46 (*)    All other components within normal limits  BRAIN NATRIURETIC PEPTIDE - Abnormal; Notable for the following components:   B Natriuretic Peptide 169.0 (*)    All other components within normal limits  RESPIRATORY PANEL BY RT PCR (FLU A&B, COVID)  CBC WITH DIFFERENTIAL/PLATELET  TROPONIN I (HIGH SENSITIVITY)  TROPONIN I (HIGH SENSITIVITY)    EKG EKG Interpretation  Date/Time:  Thursday June 09 2019 11:00:09 EST Ventricular Rate:  65 PR Interval:    QRS Duration: 130 QT Interval:  404 QTC Calculation: 420 R Axis:   130 Text Interpretation: Sinus rhythm Right bundle branch block Nonspecific T abnormalities, lateral leads Confirmed by Milton Ferguson [phone]) on 06/09/2019 11:12:05 AM   Radiology CT Chest W Contrast  Result Date: 06/09/2019 CLINICAL DATA:  Short of breath and chest pain x 2-3 months has gotten worse in the last week, hx copd, dm, left breast ca. EXAM: CT CHEST WITH CONTRAST TECHNIQUE: Multidetector CT imaging of the chest was performed during intravenous contrast administration. CONTRAST:  18mL OMNIPAQUE IOHEXOL 300 MG/ML  SOLN COMPARISON:  Current chest radiograph. FINDINGS: Cardiovascular: Heart is normal in size and configuration. No pericardial effusion. Dense three-vessel coronary artery calcifications. The great vessels are normal in caliber. There is aortic atherosclerosis. Mediastinum/Nodes: No neck base or axillary masses or enlarged lymph nodes. No mediastinal or hilar masses or enlarged lymph nodes. Trachea is unremarkable. Esophagus is normal in course and in caliber. There is a small hiatal hernia. Lungs/Pleura: Moderate to large right  pleural effusion. There is atelectasis of the right middle lobe and partial atelectasis of the upper and lower lobes. Linear area of opacity is noted in the left upper  lobe anteriorly consistent with atelectasis or scarring. No convincing pneumonia. No evidence of pulmonary edema. No lung mass or nodule. No left pleural effusion. No pneumothorax. Upper Abdomen: No acute abnormality. Musculoskeletal: No fracture or acute finding. No osteoblastic or osteolytic lesions. IMPRESSION: 1. Moderate to large right pleural effusion associated with significant atelectasis, complete atelectasis of the right middle lobe and partial atelectasis in the right lower and upper lobes. No central obstruction. 2. No convincing pneumonia and no evidence of pulmonary edema. 3. Coronary artery calcifications.  Aortic atherosclerosis. Aortic Atherosclerosis (ICD10-I70.0). Electronically Signed   By: Lajean Manes M.D.   On: 06/09/2019 13:41   DG Chest Portable 1 View  Result Date: 06/09/2019 CLINICAL DATA:  Shortness of breath EXAM: PORTABLE CHEST 1 VIEW COMPARISON:  09/25/2017 FINDINGS: Rotated. Low lung volumes. There is a moderate right pleural effusion with partial atelectasis of the right lung. Left lung appears clear. Mild cardiomegaly. Hiatal hernia. IMPRESSION: Moderate right pleural effusion with associated partial atelectasis of the right lung. Electronically Signed   By: Macy Mis M.D.   On: 06/09/2019 11:35    Procedures Procedures (including critical care time)  Medications Ordered in ED Medications  albuterol (VENTOLIN HFA) 108 (90 Base) MCG/ACT inhaler 2 puff (has no administration in time range)  iohexol (OMNIPAQUE) 300 MG/ML solution 60 mL (60 mLs Intravenous Contrast Given 06/09/19 1310)  labetalol (NORMODYNE) injection 20 mg (20 mg Intravenous Given 06/09/19 1427)    ED Course  I have reviewed the triage vital signs and the nursing notes.  Pertinent labs & imaging results that were available during  my care of the patient were reviewed by me and considered in my medical decision making (see chart for details).    MDM Rules/Calculators/A&P                      Patient with a large pleural fluid lesion on the left side causing shortness of breath.  Patient also has significant high hypertension.  CT scan shows pleural effusion but no obvious infection.  Patient will be seen by the hospitalist Final Clinical Impression(s) / ED Diagnoses Final diagnoses:  Dyspnea and respiratory abnormalities    Rx / DC Orders ED Discharge Orders    None       Milton Ferguson, MD 06/09/19 1434

## 2019-06-09 NOTE — Telephone Encounter (Signed)
Patient called with SOB. Patient states she has had the SOB for some time but it has gotten a lot worse. Patient also reports chest pain yesterday. Consult with Dr Nicki Reaper who advises patient to go to the ER for evaluation and treatment. Advised Patient and  Daughter(DPR) to go straight to ER. Daughter states she will take patient to ER now.

## 2019-06-09 NOTE — Telephone Encounter (Signed)
I agree, it would be very difficult to rule out cardiac related issues without doing enzymes so therefore ER visit

## 2019-06-09 NOTE — ED Triage Notes (Signed)
Patient reports having the covid vaccine on 05/31/2019

## 2019-06-09 NOTE — H&P (Addendum)
Patient Demographics:    Pamelia Botto, is a 86 y.o. 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   Postsurgical hypothyroidism    1) dyspnea secondary to right-sided pleural effusion- -Dyspnea has been present for about 3 months but has progressively gotten worse -Chest x-ray and CT chest with moderate right-sided effusion --status post thoracentesis with removal of 1.3 L of yellow fluid on 06/08/2018 -Await fluid studies and pleural fluid cytology and Gram stain with culture -Serum albumin is 4.2, pleural fluid albumin is 3.2, pleural fluid to the fluid is 4.5, serum total protein is 6.7 -Pleural fluid LDH is 124  -Pleural fluid glucose is 160, serum glucose 175 -Pleural fluid neutrophil count is 14% with 1819 nucleated cells -Echocardiogram with EF of 55 to 60% with grade 1 diastolic dysfunction, no regional wall motion abnormalities, no pulmonary hypertension  2) hypertensive urgency--presents with headaches, elevated BP and shortness of breath, in ED blood pressure was 230/105, improved with IV labetalol, -Restart losartan, add isosorbide   3)DM2-recent A1c 8.3 reflecting uncontrolled DM, hold glipizide while in the hospital use Novolog/Humalog Sliding scale insulin with Accu-Cheks/Fingersticks as ordered   4)HCM--patient received COVID-19 vaccination on  05/31/2019  5)HFpEF--in the setting of uncontrolled hypertension, presenting with worsening dyspnea --Echocardiogram with EF of 55 to 60% with grade 1 diastolic dysfunction, no regional wall motion abnormalities, no pulmonary hypertension -IV Lasix as ordered  6) hypothyroidism--- patient with history of postsurgical hypothyroidism, recent TSH was low, continue levothyroxine 173mcg daily, repeat TSH  7)-prior history of malignant neoplasm of the isthmus status post total thyroidectomy 11/03/2018/history of Paget's disease and intraductal carcinoma of the left breast----await cytology on right-sided pleural effusion that was drained on 06/09/2019   With History of - Reviewed by me  Past Medical History:  Diagnosis Date  . Arthritis   . Asthma   . Cancer ([place])    BREAST CANCER LEFT BREAST  . Chest pain    Evaluation prior to 2003 reportedly including cath-records never located; Echo in 2009: Mild LVH, LAE2, nl EF, moderate LAE, MR1-2  . Chronic kidney disease   . COPD (chronic obstructive pulmonary disease) (HCC)    Exertional dyspnea; PFTs in 2002: Moderate small airway obstruction, mildly reduced lung volumes and DLCO, nl ABG  . Diabetes mellitus, type II ([place])   . Dyspnea   . Family history of adverse reaction to anesthesia    daugher has postop N&V  . Fatigue   . GERD (gastroesophageal reflux  disease)   . Hyperlipidemia   . Hypertension   . Legally blind 1 1 2015  . Macular degeneration of both eyes 1 1 2015  . Parotid mass 2003   Right-2003  . Scoliosis   . Shingles       Past Surgical History:  Procedure Laterality Date  . APPENDECTOMY  1951  . BREAST BIOPSY Left 10/05/2017   Procedure: BREAST BIOPSY;  Surgeon: Aviva Signs, MD;  Location: [facility];  Service: General;  Laterality: Left;  . BREAST LUMPECTOMY Left 03/18/2018  . BREAST LUMPECTOMY Left 03/18/2018   Procedure: LEFT BREAST CENTRAL  LUMPECTOMY;  Surgeon: Rolm Bookbinder, MD;  Location: [facility];  Service:  General;  Laterality: Left;  . CARDIAC CATHETERIZATION  1999  . CHOLECYSTECTOMY  2003  . COLONOSCOPY  04/16/2006   [place]-Normal colon.  Exam limited due to retained particulate matter in the lumen of the colon, polyps less than 1 cm would have been easily missed.  Otherwise, no polyps, masses, inflammatory changes or vascular ectasia seen/ Normal retroflexed view of the rectum..  recommend repeat exam at 03/2011  . ORIF  1996  . ORIF ANKLE FRACTURE  1992  . THYROIDECTOMY N/A 11/03/2018   Procedure: TOTAL THYROIDECTOMY;  Surgeon: Michael Boston, MD;  Location: [facility];  Service: General;  Laterality: N/A;      Chief Complaint  Patient presents with  . Shortness of Breath      HPI:    Rosangelica Pevehouse  is a 86 y.o. female with past medical history relevant for prior history of malignant neoplasm of the isthmus status post total thyroidectomy 11/03/2018/history of Paget's disease and intraductal carcinoma of the left breast-, as well as history of uncontrolled HTN, uncontrolled DM, postsurgical hypothyroidism and CKD 3, as well as GERD who presents with concerns about worsening shortness of breath over the last 3 months -No frank chest pains per se patient does have some dyspnea at rest and some dyspnea on exertion -No leg pains or pleuritic symptoms -In ED she is found to have very elevated BP of 230/105 responded a bit to IV labetalol -No productive cough or fevers -In ED -Chest x-ray and CT chest with moderate right-sided pleural effusion -I discussed case with on-call radiologist Dr. Candelaria Celeste was graciously agreed to do right-sided thoracentesis with retrieval of 1.3 L of yellow fluid, cytology Gram stain and cultures are pending -Echo was performed showing preserved EF and no wall motion normalities and no pulmonary hypertension  -CBC is unremarkable specifically no leukocytosis, -LFTs are not elevated, electrolytes are within normal limits, glucose is 175 and creatinine is 1.21 -Troponin  is not elevated, BNP is 169 and COVID-19 test is negative  RN Kaitlyn at bedside   Review of systems:    In addition to the HPI above,   A full Review of  Systems was done, all other systems reviewed are negative except as noted above in HPI , .    Social History:  Reviewed by me    Social History   Tobacco Use  . Smoking status: Passive Smoke Exposure - Never Smoker  . Smokeless tobacco: Never Used  Substance Use Topics  . Alcohol use: No    Family History :  Reviewed by me    Family History  Problem Relation Age of Onset  . Stomach cancer Mother   . Alzheimer's disease Sister   . Colon cancer Neg Hx      Home Medications:   Prior to Admission medications  Medication Sig Start Date End Date Taking? Authorizing Provider  acetaminophen (TYLENOL) 500 MG tablet Take 500 mg by mouth every 6 (six) hours as needed for moderate pain or headache.   Yes [provider]  albuterol (VENTOLIN HFA) 108 (90 Base) MCG/ACT inhaler INHALE 2 PUFFS EVERY 4 HOURS AS NEEDED. 06/06/19  Yes Kathyrn Drown, MD  aspirin 81 MG tablet Take 81 mg by mouth daily.   Yes [provider]  budesonide-formoterol (SYMBICORT) 160-4.5 MCG/ACT inhaler USE 2 PUFFS TWICE DAILY FOR ASTHMA. RINSE MOUTH AFTER USE. 05/03/19  Yes Luking, Elayne Snare, MD  fluticasone (FLONASE) 50 MCG/ACT nasal spray Place 2 sprays into both nostrils daily. Patient taking differently: Place 2 sprays into both nostrils daily as needed for allergies.  03/30/18  Yes Luking, Scott A, MD  glipiZIDE (GLUCOTROL) 5 MG tablet TAKE 1 TABLET BY MOUTH EACH MORNING AND 1/2 TABLET AT SUPPER 05/03/19  Yes Luking, Scott A, MD  latanoprost (XALATAN) 0.005 % ophthalmic solution Place 1 drop into both eyes at bedtime.    Yes [provider]  losartan (COZAAR) 50 MG tablet Take 1 tablet (50 mg total) by mouth daily. 05/03/19  Yes Luking, Elayne Snare, MD  Multiple Vitamins-Minerals (PRESERVISION AREDS 2+MULTI VIT PO) Take 1 capsule  by mouth 2 (two) times daily.    Yes [provider]  pantoprazole (PROTONIX) 40 MG tablet Take 1 tablet (40 mg total) by mouth daily. 05/03/19  Yes Kathyrn Drown, MD  famotidine (PEPCID) 20 MG tablet Take one tablet by mouth each night Patient not taking: Reported on 05/03/2019 11/18/18   Kathyrn Drown, MD  levothyroxine (SYNTHROID) 112 MCG tablet TAKE ONE TABLET BY MOUTH EVERY DAY BEFORE BREAKFAST 06/09/19   Cassandria Anger, MD  traMADol (ULTRAM) 50 MG tablet Take 1-2 tablets (50-100 mg total) by mouth every 6 (six) hours as needed for moderate pain or severe pain. Patient not taking: Reported on 05/03/2019 11/03/18   Michael Boston, MD    Allergies:     Allergies  Allergen Reactions  . Lisinopril Cough  . Statins Other (See Comments)    Myalgias     Physical Exam:   Vitals  Blood pressure (!) 199/98, pulse 71, temperature 97.8 F (36.6 C), temperature source Oral, resp. rate 20, height 5\' 3"  (1.6 m), weight 80.3 kg, SpO2 97 %.   -Temp:  [97.8 F (36.6 C)] 97.8 F (36.6 C) (01/21 1102) Pulse Rate:  [58-64] 58 (01/21 1620) Resp:  [20] 20 (01/21 1102) BP: (170-230)/(88-105) 170/88 (01/21 1620) SpO2:  [97 %-98 %] 97 % (01/21 1620) Weight:  [80.3 kg] 80.3 kg (01/21 1104)   Physical Examination: General appearance - alert, well appearing, and in no distress and  Mental status - alert, oriented to person, place, and time,  Eyes - sclera anicteric Neck - supple, no JVD elevation , Chest -improved air movement on the right after thoracic 10 6, no wheezing  heart - S1 and S2 normal, regular  Abdomen - soft, nontender, nondistended, no masses or organomegaly Neurological - screening mental status exam normal, neck supple without rigidity, cranial nerves II through XII intact, DTR's normal and symmetric Extremities - no pedal edema noted, intact peripheral pulses  Skin - warm, dry    Data Review:    CBC Recent Labs  Lab 06/09/19 1110  WBC 5.9  HGB 13.0  HCT  41.4  PLT 180  MCV 95.0  MCH 29.8  MCHC 31.4  RDW 14.2  LYMPHSABS 1.8  MONOABS 0.9  EOSABS 0.1  BASOSABS 0.0   ------------------------------------------------------------------------------------------------------------------  Chemistries  Recent Labs  Lab 06/09/19 1110  NA 140  K 4.3  CL 104  CO2 27  GLUCOSE 175*  BUN 23  CREATININE 1.21*  CALCIUM 9.0  AST 17  ALT 14  ALKPHOS 84  BILITOT 0.8   ------------------------------------------------------------------------------------------------------------------ estimated creatinine clearance is 31.6 mL/min (A) (by C-G formula based on SCr of 1.21 mg/dL (H)). ------------------------------------------------------------------------------------------------------------------ No results for input(s): TSH, T4TOTAL, T3FREE, THYROIDAB in the last 72 hours.  Invalid input(s): FREET3   Coagulation profile No results for input(s): INR, PROTIME in the last 168 hours. ------------------------------------------------------------------------------------------------------------------- No results for input(s): DDIMER in the last 72 hours. -------------------------------------------------------------------------------------------------------------------  Cardiac Enzymes No results for input(s): CKMB, TROPONINI, MYOGLOBIN in the last 168 hours.  Invalid input(s): CK ------------------------------------------------------------------------------------------------------------------    Component Value Date/Time   BNP 169.0 (H) 06/09/2019 1110   BNP 137.7 (H) 09/08/2012 1429    --------------------------------------------------------------------------------------------------------------  Urinalysis    Component Value Date/Time   COLORURINE YELLOW 12/31/2011 1538   APPEARANCEUR CLEAR 12/31/2011 1538   LABSPEC 1.010 12/31/2011 1538   PHURINE 5.5 12/31/2011 1538   GLUCOSEU >1000 (A) 12/31/2011 1538   HGBUR NEGATIVE 12/31/2011 1538     BILIRUBINUR NEGATIVE 12/31/2011 1538   KETONESUR NEGATIVE 12/31/2011 1538   PROTEINUR NEGATIVE 12/31/2011 1538   UROBILINOGEN 0.2 12/31/2011 1538   NITRITE NEGATIVE 12/31/2011 1538   LEUKOCYTESUR NEGATIVE 12/31/2011 1538    ----------------------------------------------------------------------------------------------------------------   Imaging Results:    DG Chest 1 View  Result Date: 06/09/2019 CLINICAL DATA:  RIGHT pleural effusion post thoracentesis EXAM: CHEST  1 VIEW COMPARISON:  CT chest and chest radiographs of 06/09/2019 FINDINGS: Rotated to the LEFT. Enlargement of cardiac silhouette. Atherosclerotic calcification aorta. Significantly decreased RIGHT pleural effusion and basilar atelectasis post thoracentesis. No pneumothorax. Bones demineralized. IMPRESSION: No pneumothorax following thoracentesis. Significantly decreased RIGHT pleural effusion and basilar atelectasis. Electronically Signed   By: Lavonia Dana M.D.   On: 06/09/2019 15:41   CT Chest W Contrast  Result Date: 06/09/2019 CLINICAL DATA:  Short of breath and chest pain x 2-3 months has gotten worse in the last week, hx copd, dm, left breast ca. EXAM: CT CHEST WITH CONTRAST TECHNIQUE: Multidetector CT imaging of the chest was performed during intravenous contrast administration. CONTRAST:  58mL OMNIPAQUE IOHEXOL 300 MG/ML  SOLN COMPARISON:  Current chest radiograph. FINDINGS: Cardiovascular: Heart is normal in size and configuration. No pericardial effusion. Dense three-vessel coronary artery calcifications. The great vessels are normal in caliber. There is aortic atherosclerosis. Mediastinum/Nodes: No neck base or axillary masses or enlarged lymph nodes. No mediastinal or hilar masses or enlarged lymph nodes. Trachea is unremarkable. Esophagus is normal in course and in caliber. There is a small hiatal hernia. Lungs/Pleura: Moderate to large right pleural effusion. There is atelectasis of the right middle lobe and partial  atelectasis of the upper and lower lobes. Linear area of opacity is noted in the left upper lobe anteriorly consistent with atelectasis or scarring. No convincing pneumonia. No evidence of pulmonary edema. No lung mass or nodule. No left pleural effusion. No pneumothorax. Upper Abdomen: No acute abnormality. Musculoskeletal: No fracture or acute finding. No osteoblastic or osteolytic lesions. IMPRESSION: 1. Moderate to large right pleural effusion associated with significant atelectasis, complete atelectasis of the right middle lobe and partial atelectasis in the right lower and upper lobes. No central obstruction. 2. No convincing pneumonia and no evidence of pulmonary edema. 3. Coronary artery calcifications.  Aortic atherosclerosis.  Aortic Atherosclerosis (ICD10-I70.0). Electronically Signed   By: Lajean Manes M.D.   On: 06/09/2019 13:41   DG Chest Portable 1 View  Result Date: 06/09/2019 CLINICAL DATA:  Shortness of breath EXAM: PORTABLE CHEST 1 VIEW COMPARISON:  09/25/2017 FINDINGS: Rotated. Low lung volumes. There is a moderate right pleural effusion with partial atelectasis of the right lung. Left lung appears clear. Mild cardiomegaly. Hiatal hernia. IMPRESSION: Moderate right pleural effusion with associated partial atelectasis of the right lung. Electronically Signed   By: Macy Mis M.D.   On: 06/09/2019 11:35   ECHOCARDIOGRAM COMPLETE  Result Date: 06/09/2019   ECHOCARDIOGRAM REPORT   Patient Name:   AVIKA CARBINE Date of Exam: 06/09/2019 Medical Rec #:  [id number]      Height:       63.0 in Accession #:    [id number]     Weight:       177.0 lb Date of Birth:  30-Mar-1928      BSA:          1.84 m Patient Age:    57 years       BP:           187/79 mmHg Patient Gender: F              HR:           64 bpm. Exam Location:  [facility] Procedure: 2D Echo, Cardiac Doppler and Color Doppler Indications:    Dyspnea 786.09 / R06.00  History:        Patient has prior history of Echocardiogram  examinations, most                 recent 09/14/2012. COPD; Risk Factors:Hypertension, Diabetes and                 Dyslipidemia. Paget's disease and intraductal carcinoma of                 breast, left,Pleural effusion on right.  Sonographer:    Alvino Chapel RCS Referring Phys: [phone] Cecile Gillispie IMPRESSIONS  1. Left ventricular ejection fraction, by visual estimation, is 55 to 60%. The left ventricle has normal function. There is mildly increased left ventricular hypertrophy.  2. Elevated left ventricular end-diastolic pressure.  3. Left ventricular diastolic parameters are consistent with Grade I diastolic dysfunction (impaired relaxation).  4. The left ventricle has no regional wall motion abnormalities.  5. Global right ventricle has normal systolic function.The right ventricular size is normal. No increase in right ventricular wall thickness.  6. Left atrial size was normal.  7. Right atrial size was normal.  8. Mild mitral annular calcification.  9. The mitral valve is grossly normal. No evidence of mitral valve regurgitation. 10. The tricuspid valve is grossly normal. 11. The tricuspid valve is grossly normal. Tricuspid valve regurgitation is trivial. 12. The aortic valve is tricuspid. Aortic valve regurgitation is not visualized. 13. Pulmonic regurgitation is mild. 14. The pulmonic valve was grossly normal. Pulmonic valve regurgitation is mild. 15. Normal pulmonary artery systolic pressure. 16. The inferior vena cava is normal in size with greater than 50% respiratory variability, suggesting right atrial pressure of 3 mmHg. FINDINGS  Left Ventricle: Left ventricular ejection fraction, by visual estimation, is 55 to 60%. The left ventricle has normal function. The left ventricle has no regional wall motion abnormalities. The left ventricular internal cavity size was the left ventricle is normal in size. There is mildly increased left ventricular hypertrophy. Concentric left ventricular  hypertrophy. Left  ventricular diastolic parameters are consistent with Grade I diastolic dysfunction (impaired relaxation). Elevated left ventricular end-diastolic pressure. Right Ventricle: The right ventricular size is normal. No increase in right ventricular wall thickness. Global RV systolic function is has normal systolic function. The tricuspid regurgitant velocity is 1.40 m/s, and with an assumed right atrial pressure  of 3 mmHg, the estimated right ventricular systolic pressure is normal at 10.8 mmHg. Left Atrium: Left atrial size was normal in size. Right Atrium: Right atrial size was normal in size Pericardium: There is no evidence of pericardial effusion. Mitral Valve: The mitral valve is grossly normal. Mild mitral annular calcification. No evidence of mitral valve regurgitation. Tricuspid Valve: The tricuspid valve is grossly normal. Tricuspid valve regurgitation is trivial. Aortic Valve: The aortic valve is tricuspid. . There is mild thickening of the aortic valve. Aortic valve regurgitation is not visualized. There is mild thickening of the aortic valve. Pulmonic Valve: The pulmonic valve was grossly normal. Pulmonic valve regurgitation is mild. Pulmonic regurgitation is mild. Aorta: The aortic root is normal in size and structure. Venous: The inferior vena cava is normal in size with greater than 50% respiratory variability, suggesting right atrial pressure of 3 mmHg. IAS/Shunts: No atrial level shunt detected by color flow Doppler.  LEFT VENTRICLE PLAX 2D LVIDd:         4.44 cm  Diastology LVIDs:         3.13 cm  LV e' lateral:   6.53 cm/s LV PW:         1.17 cm  LV E/e' lateral: 15.8 LV IVS:        1.29 cm  LV e' medial:    4.46 cm/s LVOT diam:     2.10 cm  LV E/e' medial:  23.1 LV SV:         51 ml LV SV Index:   26.46 LVOT Area:     3.46 cm  RIGHT VENTRICLE RV S prime:     14.70 cm/s TAPSE (M-mode): 1.5 cm LEFT ATRIUM             Index       RIGHT ATRIUM           Index LA diam:        3.60 cm 1.96 cm/m  RA  Area:     10.60 cm LA Vol (A2C):   45.4 ml 24.73 ml/m RA Volume:   20.90 ml  11.38 ml/m LA Vol (A4C):   47.5 ml 25.87 ml/m LA Biplane Vol: 46.4 ml 25.27 ml/m  AORTIC VALVE LVOT Vmax:   118.00 cm/s LVOT Vmean:  71.900 cm/s LVOT VTI:    0.242 m  AORTA Ao Root diam: 3.00 cm MITRAL VALVE                         TRICUSPID VALVE MV Area (PHT): 3.77 cm              TR Peak grad:   7.8 mmHg MV PHT:        58.29 msec            TR Vmax:        140.00 cm/s MV Decel Time: 201 msec MV E velocity: 103.00 cm/s 103 cm/s  SHUNTS MV A velocity: 124.00 cm/s 70.3 cm/s Systemic VTI:  0.24 m MV E/A ratio:  0.83        1.5       Systemic  Diam: 2.10 cm  Kate Sable MD Electronically signed by Kate Sable MD Signature Date/Time: 06/09/2019/4:13:25 PM    Final    US THORACENTESIS ASP PLEURAL SPACE W/IMG GUIDE  Result Date: 06/09/2019 INDICATION: RIGHT pleural effusion EXAM: ULTRASOUND GUIDED DIAGNOSTIC AND THERAPEUTIC RIGHT THORACENTESIS MEDICATIONS: None COMPLICATIONS: None immediate PROCEDURE: An ultrasound guided thoracentesis was thoroughly discussed with the patient and questions answered. The benefits, risks, alternatives and complications were also discussed. The patient understands and wishes to proceed with the procedure. Written consent was obtained. Ultrasound was performed to localize and mark an adequate pocket of fluid in the RIGHT chest. The area was then prepped and draped in the normal sterile fashion. 1% Lidocaine was used for local anesthesia. Under ultrasound guidance a 8 French thoracentesis catheter was introduced. Thoracentesis was performed. The catheter was removed and a dressing applied. Procedure tolerated very well by patient without immediate complication. FINDINGS: A total of approximately 1.3 L of yellow RIGHT pleural fluid was removed. Samples were sent to the laboratory as requested by the clinical team. IMPRESSION: Successful ultrasound guided RIGHT thoracentesis yielding 1.3 L of  pleural fluid. Electronically Signed   By: Lavonia Dana M.D.   On: 06/09/2019 15:46    Radiological Exams on Admission: DG Chest 1 View  Result Date: 06/09/2019 CLINICAL DATA:  RIGHT pleural effusion post thoracentesis EXAM: CHEST  1 VIEW COMPARISON:  CT chest and chest radiographs of 06/09/2019 FINDINGS: Rotated to the LEFT. Enlargement of cardiac silhouette. Atherosclerotic calcification aorta. Significantly decreased RIGHT pleural effusion and basilar atelectasis post thoracentesis. No pneumothorax. Bones demineralized. IMPRESSION: No pneumothorax following thoracentesis. Significantly decreased RIGHT pleural effusion and basilar atelectasis. Electronically Signed   By: Lavonia Dana M.D.   On: 06/09/2019 15:41   CT Chest W Contrast  Result Date: 06/09/2019 CLINICAL DATA:  Short of breath and chest pain x 2-3 months has gotten worse in the last week, hx copd, dm, left breast ca. EXAM: CT CHEST WITH CONTRAST TECHNIQUE: Multidetector CT imaging of the chest was performed during intravenous contrast administration. CONTRAST:  47mL OMNIPAQUE IOHEXOL 300 MG/ML  SOLN COMPARISON:  Current chest radiograph. FINDINGS: Cardiovascular: Heart is normal in size and configuration. No pericardial effusion. Dense three-vessel coronary artery calcifications. The great vessels are normal in caliber. There is aortic atherosclerosis. Mediastinum/Nodes: No neck base or axillary masses or enlarged lymph nodes. No mediastinal or hilar masses or enlarged lymph nodes. Trachea is unremarkable. Esophagus is normal in course and in caliber. There is a small hiatal hernia. Lungs/Pleura: Moderate to large right pleural effusion. There is atelectasis of the right middle lobe and partial atelectasis of the upper and lower lobes. Linear area of opacity is noted in the left upper lobe anteriorly consistent with atelectasis or scarring. No convincing pneumonia. No evidence of pulmonary edema. No lung mass or nodule. No left pleural  effusion. No pneumothorax. Upper Abdomen: No acute abnormality. Musculoskeletal: No fracture or acute finding. No osteoblastic or osteolytic lesions. IMPRESSION: 1. Moderate to large right pleural effusion associated with significant atelectasis, complete atelectasis of the right middle lobe and partial atelectasis in the right lower and upper lobes. No central obstruction. 2. No convincing pneumonia and no evidence of pulmonary edema. 3. Coronary artery calcifications.  Aortic atherosclerosis. Aortic Atherosclerosis (ICD10-I70.0). Electronically Signed   By: Lajean Manes M.D.   On: 06/09/2019 13:41   DG Chest Portable 1 View  Result Date: 06/09/2019 CLINICAL DATA:  Shortness of breath EXAM: PORTABLE CHEST 1 VIEW COMPARISON:  09/25/2017 FINDINGS: Rotated. Low  lung volumes. There is a moderate right pleural effusion with partial atelectasis of the right lung. Left lung appears clear. Mild cardiomegaly. Hiatal hernia. IMPRESSION: Moderate right pleural effusion with associated partial atelectasis of the right lung. Electronically Signed   By: Macy Mis M.D.   On: 06/09/2019 11:35   ECHOCARDIOGRAM COMPLETE  Result Date: 06/09/2019   ECHOCARDIOGRAM REPORT   Patient Name:   ELLER SWEIS Date of Exam: 06/09/2019 Medical Rec #:  [id number]      Height:       63.0 in Accession #:    [id number]     Weight:       177.0 lb Date of Birth:  14-Sep-1927      BSA:          1.84 m Patient Age:    36 years       BP:           187/79 mmHg Patient Gender: F              HR:           64 bpm. Exam Location:  [facility] Procedure: 2D Echo, Cardiac Doppler and Color Doppler Indications:    Dyspnea 786.09 / R06.00  History:        Patient has prior history of Echocardiogram examinations, most                 recent 09/14/2012. COPD; Risk Factors:Hypertension, Diabetes and                 Dyslipidemia. Paget's disease and intraductal carcinoma of                 breast, left,Pleural effusion on right.  Sonographer:    Alvino Chapel RCS Referring Phys: [phone] Lance Galas IMPRESSIONS  1. Left ventricular ejection fraction, by visual estimation, is 55 to 60%. The left ventricle has normal function. There is mildly increased left ventricular hypertrophy.  2. Elevated left ventricular end-diastolic pressure.  3. Left ventricular diastolic parameters are consistent with Grade I diastolic dysfunction (impaired relaxation).  4. The left ventricle has no regional wall motion abnormalities.  5. Global right ventricle has normal systolic function.The right ventricular size is normal. No increase in right ventricular wall thickness.  6. Left atrial size was normal.  7. Right atrial size was normal.  8. Mild mitral annular calcification.  9. The mitral valve is grossly normal. No evidence of mitral valve regurgitation. 10. The tricuspid valve is grossly normal. 11. The tricuspid valve is grossly normal. Tricuspid valve regurgitation is trivial. 12. The aortic valve is tricuspid. Aortic valve regurgitation is not visualized. 13. Pulmonic regurgitation is mild. 14. The pulmonic valve was grossly normal. Pulmonic valve regurgitation is mild. 15. Normal pulmonary artery systolic pressure. 16. The inferior vena cava is normal in size with greater than 50% respiratory variability, suggesting right atrial pressure of 3 mmHg. FINDINGS  Left Ventricle: Left ventricular ejection fraction, by visual estimation, is 55 to 60%. The left ventricle has normal function. The left ventricle has no regional wall motion abnormalities. The left ventricular internal cavity size was the left ventricle is normal in size. There is mildly increased left ventricular hypertrophy. Concentric left ventricular hypertrophy. Left ventricular diastolic parameters are consistent with Grade I diastolic dysfunction (impaired relaxation). Elevated left ventricular end-diastolic pressure. Right Ventricle: The right ventricular size is normal. No increase in right ventricular wall  thickness. Global RV systolic function is has normal systolic function. The  tricuspid regurgitant velocity is 1.40 m/s, and with an assumed right atrial pressure  of 3 mmHg, the estimated right ventricular systolic pressure is normal at 10.8 mmHg. Left Atrium: Left atrial size was normal in size. Right Atrium: Right atrial size was normal in size Pericardium: There is no evidence of pericardial effusion. Mitral Valve: The mitral valve is grossly normal. Mild mitral annular calcification. No evidence of mitral valve regurgitation. Tricuspid Valve: The tricuspid valve is grossly normal. Tricuspid valve regurgitation is trivial. Aortic Valve: The aortic valve is tricuspid. . There is mild thickening of the aortic valve. Aortic valve regurgitation is not visualized. There is mild thickening of the aortic valve. Pulmonic Valve: The pulmonic valve was grossly normal. Pulmonic valve regurgitation is mild. Pulmonic regurgitation is mild. Aorta: The aortic root is normal in size and structure. Venous: The inferior vena cava is normal in size with greater than 50% respiratory variability, suggesting right atrial pressure of 3 mmHg. IAS/Shunts: No atrial level shunt detected by color flow Doppler.  LEFT VENTRICLE PLAX 2D LVIDd:         4.44 cm  Diastology LVIDs:         3.13 cm  LV e' lateral:   6.53 cm/s LV PW:         1.17 cm  LV E/e' lateral: 15.8 LV IVS:        1.29 cm  LV e' medial:    4.46 cm/s LVOT diam:     2.10 cm  LV E/e' medial:  23.1 LV SV:         51 ml LV SV Index:   26.46 LVOT Area:     3.46 cm  RIGHT VENTRICLE RV S prime:     14.70 cm/s TAPSE (M-mode): 1.5 cm LEFT ATRIUM             Index       RIGHT ATRIUM           Index LA diam:        3.60 cm 1.96 cm/m  RA Area:     10.60 cm LA Vol (A2C):   45.4 ml 24.73 ml/m RA Volume:   20.90 ml  11.38 ml/m LA Vol (A4C):   47.5 ml 25.87 ml/m LA Biplane Vol: 46.4 ml 25.27 ml/m  AORTIC VALVE LVOT Vmax:   118.00 cm/s LVOT Vmean:  71.900 cm/s LVOT VTI:    0.242 m   AORTA Ao Root diam: 3.00 cm MITRAL VALVE                         TRICUSPID VALVE MV Area (PHT): 3.77 cm              TR Peak grad:   7.8 mmHg MV PHT:        58.29 msec            TR Vmax:        140.00 cm/s MV Decel Time: 201 msec MV E velocity: 103.00 cm/s 103 cm/s  SHUNTS MV A velocity: 124.00 cm/s 70.3 cm/s Systemic VTI:  0.24 m MV E/A ratio:  0.83        1.5       Systemic Diam: 2.10 cm  Kate Sable MD Electronically signed by Kate Sable MD Signature Date/Time: 06/09/2019/4:13:25 PM    Final    US THORACENTESIS ASP PLEURAL SPACE W/IMG GUIDE  Result Date: 06/09/2019 INDICATION: RIGHT pleural effusion EXAM: ULTRASOUND GUIDED DIAGNOSTIC AND THERAPEUTIC  RIGHT THORACENTESIS MEDICATIONS: None COMPLICATIONS: None immediate PROCEDURE: An ultrasound guided thoracentesis was thoroughly discussed with the patient and questions answered. The benefits, risks, alternatives and complications were also discussed. The patient understands and wishes to proceed with the procedure. Written consent was obtained. Ultrasound was performed to localize and mark an adequate pocket of fluid in the RIGHT chest. The area was then prepped and draped in the normal sterile fashion. 1% Lidocaine was used for local anesthesia. Under ultrasound guidance a 8 French thoracentesis catheter was introduced. Thoracentesis was performed. The catheter was removed and a dressing applied. Procedure tolerated very well by patient without immediate complication. FINDINGS: A total of approximately 1.3 L of yellow RIGHT pleural fluid was removed. Samples were sent to the laboratory as requested by the clinical team. IMPRESSION: Successful ultrasound guided RIGHT thoracentesis yielding 1.3 L of pleural fluid. Electronically Signed   By: Lavonia Dana M.D.   On: 06/09/2019 15:46    DVT Prophylaxis -SCD/heparin AM Labs Ordered, also please review Full Orders  Family Communication: Admission, patients condition and plan of care including tests  being ordered have been discussed with the patient  who indicate understanding and agree with the plan   Code Status - Full Code  Likely DC to  TBD  Condition   stable  Roxan Hockey M.D on 06/09/2019 at 4:22 PM Go to [email or web address] -  for contact info  [facility] - Office  [phone]

## 2019-06-09 NOTE — ED Triage Notes (Signed)
Patient complains of shortness of breath that began 3 months ago. Denies exposure to covid. Patient sent by PCP.

## 2019-06-09 NOTE — Progress Notes (Signed)
*  PRELIMINARY RESULTS* Echocardiogram 2D Echocardiogram has been performed.  Victoria Vasquez 06/09/2019, 4:06 PM

## 2019-06-09 NOTE — Procedures (Signed)
PreOperative Dx: RT pleural effusion Postoperative Dx: RT pleural effusion Procedure:   US guided RT thoracentesis Radiologist:  Thornton Papas Anesthesia:  10 ml of 1% lidocaine Specimen:  1.3 L of yellow colored fluid EBL:   < 1 ml Complications: None

## 2019-06-10 ENCOUNTER — Inpatient Hospital Stay (HOSPITAL_COMMUNITY): Payer: PPO

## 2019-06-10 DIAGNOSIS — J90 Pleural effusion, not elsewhere classified: Secondary | ICD-10-CM | POA: Diagnosis present

## 2019-06-10 DIAGNOSIS — J449 Chronic obstructive pulmonary disease, unspecified: Secondary | ICD-10-CM | POA: Diagnosis not present

## 2019-06-10 LAB — PH, BODY FLUID: pH, Body Fluid: 7.6

## 2019-06-10 LAB — GLUCOSE, CAPILLARY
Glucose-Capillary: 153 mg/dL — ABNORMAL HIGH (ref 70–99)
Glucose-Capillary: 202 mg/dL — ABNORMAL HIGH (ref 70–99)
Glucose-Capillary: 156 mg/dL — ABNORMAL HIGH (ref 70–99)

## 2019-06-10 LAB — CBC
HCT: 39.3 % (ref 36.0–46.0)
Hemoglobin: 12.7 g/dL (ref 12.0–15.0)
MCH: 30 pg (ref 26.0–34.0)
MCHC: 32.3 g/dL (ref 30.0–36.0)
MCV: 92.9 fL (ref 80.0–100.0)
Platelets: 175 10*3/uL (ref 150–400)
RBC: 4.23 MIL/uL (ref 3.87–5.11)
RDW: 14.3 % (ref 11.5–15.5)
WBC: 7.5 10*3/uL (ref 4.0–10.5)
nRBC: 0 % (ref 0.0–0.2)

## 2019-06-10 LAB — COMPREHENSIVE METABOLIC PANEL
ALT: 13 U/L (ref 0–44)
AST: 16 U/L (ref 15–41)
Albumin: 3.9 g/dL (ref 3.5–5.0)
Alkaline Phosphatase: 75 U/L (ref 38–126)
Anion gap: 11 (ref 5–15)
BUN: 26 mg/dL — ABNORMAL HIGH (ref 8–23)
CO2: 27 mmol/L (ref 22–32)
Calcium: 8.8 mg/dL — ABNORMAL LOW (ref 8.9–10.3)
Chloride: 103 mmol/L (ref 98–111)
Creatinine, Ser: 1.44 mg/dL — ABNORMAL HIGH (ref 0.44–1.00)
GFR calc Af Amer: 37 mL/min — ABNORMAL LOW (ref >60)
GFR calc non Af Amer: 32 mL/min — ABNORMAL LOW (ref >60)
Glucose, Bld: 185 mg/dL — ABNORMAL HIGH (ref 70–99)
Potassium: 3.9 mmol/L (ref 3.5–5.1)
Sodium: 141 mmol/L (ref 135–145)
Total Bilirubin: 0.8 mg/dL (ref 0.3–1.2)
Total Protein: 6.2 g/dL — ABNORMAL LOW (ref 6.5–8.1)

## 2019-06-10 LAB — ACID FAST SMEAR (AFB, MYCOBACTERIA): Acid Fast Smear: NEGATIVE

## 2019-06-10 MED ORDER — FUROSEMIDE 20 MG PO TABS: 20.0000 mg | ORAL_TABLET | Freq: Every day | ORAL | 0 refills | Status: AC

## 2019-06-10 MED ORDER — LOSARTAN POTASSIUM 50 MG PO TABS
25.0000 mg | ORAL_TABLET | Freq: Every day | ORAL | Status: DC
  Administered 2019-06-10: 25 mg via ORAL

## 2019-06-10 MED ORDER — ASPIRIN 81 MG PO TABS: 81.0000 mg | ORAL_TABLET | Freq: Every day | ORAL | 1 refills | Status: AC

## 2019-06-10 NOTE — Progress Notes (Signed)
Nsg Discharge Note  Admit Date:  06/09/2019 Discharge date: 06/10/2019   Victoria Vasquez to be D/C'd Home per MD order.  AVS completed.  Copy for chart, and copy for patient signed, and dated. Removed IV-clean, dry, intact. Reviewed d/c paperwork with her daughter, [name], on the phone. Answered all questions.  Patient/caregiver able to verbalize understanding. Wheeled stable patient and belongings to short stay entrance where she was picked up by her daughter to d/c to home.  Discharge Medication: Allergies as of 06/10/2019      Reactions   Lisinopril Cough   Statins Other (See Comments)   Myalgias      Medication List    STOP taking these medications   traMADol 50 MG tablet Commonly known as: ULTRAM     TAKE these medications   acetaminophen 500 MG tablet Commonly known as: TYLENOL Take 500 mg by mouth every 6 (six) hours as needed for moderate pain or headache.   albuterol 108 (90 Base) MCG/ACT inhaler Commonly known as: VENTOLIN HFA INHALE 2 PUFFS EVERY 4 HOURS AS NEEDED.   aspirin 81 MG tablet Take 1 tablet (81 mg total) by mouth daily with breakfast. What changed: when to take this   budesonide-formoterol 160-4.5 MCG/ACT inhaler Commonly known as: Symbicort USE 2 PUFFS TWICE DAILY FOR ASTHMA. RINSE MOUTH AFTER USE.   famotidine 20 MG tablet Commonly known as: Pepcid Take one tablet by mouth each night   fluticasone 50 MCG/ACT nasal spray Commonly known as: FLONASE Place 2 sprays into both nostrils daily. What changed:   when to take this  reasons to take this   furosemide 20 MG tablet Commonly known as: Lasix Take 1 tablet (20 mg total) by mouth daily.   glipiZIDE 5 MG tablet Commonly known as: GLUCOTROL TAKE 1 TABLET BY MOUTH EACH MORNING AND 1/2 TABLET AT SUPPER   latanoprost 0.005 % ophthalmic solution Commonly known as: XALATAN Place 1 drop into both eyes at bedtime.   levothyroxine 112 MCG tablet Commonly known as: SYNTHROID TAKE ONE TABLET BY  MOUTH EVERY DAY BEFORE BREAKFAST   losartan 50 MG tablet Commonly known as: COZAAR Take 1 tablet (50 mg total) by mouth daily.   pantoprazole 40 MG tablet Commonly known as: PROTONIX Take 1 tablet (40 mg total) by mouth daily.   PRESERVISION AREDS 2+MULTI VIT PO Take 1 capsule by mouth 2 (two) times daily.            Durable Medical Equipment  (From admission, onward)         Start     Ordered   06/10/19 1726  For home use only DME Walker rolling  Once    Comments: Rolling 5 inch wheel walker  Question Answer Comment  Walker: With 5 Inch Wheels   Patient needs a walker to treat with the following condition Weakness      06/10/19 1726          Discharge Assessment: Vitals:   06/10/19 1023 06/10/19 1600  BP:  (!) 148/72  Pulse:  65  Resp:  18  Temp:  97.7 F (36.5 C)  SpO2: 97% 100%   Skin clean, dry and intact without evidence of skin break down, no evidence of skin tears noted. IV catheter discontinued intact. Site without signs and symptoms of complications - no redness or edema noted at insertion site, patient denies c/o pain - only slight tenderness at site.  Dressing with slight pressure applied.  D/c Instructions-Education: Discharge instructions given to  patient/family with verbalized understanding. D/c education completed with patient/family including follow up instructions, medication list, d/c activities limitations if indicated, with other d/c instructions as indicated by MD - patient able to verbalize understanding, all questions fully answered. Patient instructed to return to ED, call 911, or call MD for any changes in condition.  Patient escorted via [place], and D/C home via private auto.  Santa Lighter, RN 06/10/2019 6:05 PM

## 2019-06-10 NOTE — Evaluation (Signed)
Physical Therapy Evaluation Patient Details Name: Victoria Vasquez MRN: [id number] DOB: [birth year] Today's Date: 06/10/2019   History of Present Illness  Victoria Vasquez  is a 86 y.o. 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 She requires min guard assist for transfers and ambulation for balance and some verbal cueing for safety to use her seat to push up and to hold on to seat to sit down instead of walker. She is most limited by impaired activity tolerance today but overall tolerates session well. Patient will benefit from continued physical therapy in hospital and recommended venue below to increase strength, balance, endurance for safe ADLs and gait.     Follow Up Recommendations Home health PT;Supervision for mobility/OOB;Supervision - Intermittent    Equipment Recommendations  Rolling walker with 5" wheels    Recommendations for Other Services       Precautions / Restrictions Precautions Precautions: Fall Restrictions Weight Bearing Restrictions: No      Mobility  Bed Mobility Overal bed mobility: Needs Assistance Bed Mobility: Supine to Sit;Sit to Supine     Supine to sit: Supervision Sit to supine: Supervision   General bed mobility comments: slow, labored movement  Transfers Overall transfer level: Needs assistance Equipment used: Rolling walker (2 wheeled) Transfers: Sit to/from [employer] Sit to Stand: Min guard Stand pivot transfers: Min guard       General transfer comment: min guard for safety and balance, some verbal cueing for correct use of RW with transfers and sequencing  Ambulation/Gait Ambulation/Gait assistance: Min guard Gait Distance (Feet): 100 Feet Assistive device: Rolling walker (2 wheeled) Gait Pattern/deviations: Step-through pattern;Decreased step length - right;Decreased step length - left;Decreased stride length Gait velocity: decreased   General Gait Details: slow, labored but safe with use of RW  Stairs            Wheelchair Mobility    Modified Rankin (Stroke Patients Only)       Balance Overall balance assessment: Needs assistance Sitting-balance support: No upper extremity supported;Feet supported Sitting balance-Leahy Scale: Good Sitting balance - Comments: seated EOB   Standing balance support: Bilateral upper extremity supported;During functional activity Standing balance-Leahy Scale: Fair Standing balance comment: using RW                             Pertinent Vitals/Pain Pain Assessment: Faces Faces Pain Scale: Hurts little more Pain Location: back Pain Intervention(s): Limited activity within patient's tolerance;Monitored during session    [name] expects to be discharged to:: Private residence Living Arrangements: Children Available Help at Discharge: Family;Available PRN/intermittently Type of Home: House Home Access: Level entry     Home Layout: Multi-level;Able to live on main level with bedroom/bathroom Home Equipment: Walker - 4 wheels;Cane - single point;Shower seat Additional Comments: Patient states she does not go into basement    Prior Function Level of Independence: Needs assistance   Gait / Transfers Assistance Needed: Patient ambulates with  use of RW with 4 wheels occasionally and uses "whatever I can get to" for balance suggesting walls and  furniture  ADL's / Homemaking Assistance Needed: daughter assists  Comments: Patient is a household ambulator     Journalist, newspaper        Extremity/Trunk Assessment   Upper Extremity Assessment Upper Extremity Assessment: Overall WFL for tasks assessed    Lower Extremity Assessment Lower Extremity Assessment: Generalized weakness    Cervical / Trunk Assessment Cervical / Trunk Assessment: Kyphotic  Communication   Communication: HOH  Cognition Arousal/Alertness: Awake/alert Behavior During Therapy: WFL for tasks assessed/performed Overall Cognitive Status: Within Functional Limits for tasks assessed                                        General Comments      Exercises     Assessment/Plan    PT Assessment Patient needs continued PT services  PT Problem List Decreased strength;Decreased activity tolerance;Decreased balance;Decreased mobility;Decreased knowledge of use of DME;Pain       PT Treatment Interventions DME instruction;Gait training;Functional mobility training;Therapeutic activities;Therapeutic exercise;Balance training;Neuromuscular re-education;Patient/family education    PT Goals (Current goals can be found in the Care Plan section)  Acute Rehab PT Goals Patient Stated Goal: return home with family to assist PT Goal Formulation: With patient Time For Goal Achievement: 06/24/19 Potential to Achieve Goals: Good    Frequency Min 3X/week   Barriers to discharge        Co-evaluation               AM-PAC PT "6 Clicks" Mobility  Outcome Measure Help needed turning from your back to your side while in a flat bed without using bedrails?: None Help needed moving from lying on your back to sitting on the side of a flat bed without using bedrails?: None Help needed moving to and from a bed to a chair (including a wheelchair)?: A Little Help needed standing up from a chair using your arms (e.g., wheelchair or bedside chair)?: A  Little Help needed to walk in hospital room?: A Little Help needed climbing 3-5 steps with a railing? : A Lot 6 Click Score: 19    End of Session Equipment Utilized During Treatment: Gait belt Activity Tolerance: Patient tolerated treatment well;Patient limited by fatigue Patient left: in chair;with chair alarm set;with call bell/phone within reach Nurse Communication: Mobility status PT Visit Diagnosis: Unsteadiness on feet (R26.81);Other abnormalities of gait and mobility (R26.89);Muscle weakness (generalized) (M62.81)    Time: 8338-2505 PT Time Calculation (min) (ACUTE ONLY): 26 min   Charges:   PT Evaluation $PT Eval Low Complexity: 1 Low PT Treatments $Therapeutic Activity: 23-37 mins        12:12 PM, 06/10/19 Mearl Latin PT, DPT Physical Therapist at [facility]

## 2019-06-10 NOTE — Progress Notes (Signed)
SATURATION QUALIFICATIONS: (This note is used to comply with regulatory documentation for home oxygen)  Patient Saturations on Room Air at Rest = 100%  Patient Saturations on Room Air while Ambulating = 100%  Patient Saturations on 0 Liters of oxygen while Ambulating = N/A%  Please briefly explain why patient needs home oxygen:

## 2019-06-10 NOTE — Care Management Important Message (Signed)
Important Message  Patient Details  Name: Victoria Vasquez MRN: [id number] Date of Birth: 1927/09/22   Medicare Important Message Given:  Yes     Tommy Medal 06/10/2019, 3:17 PM

## 2019-06-10 NOTE — Plan of Care (Signed)
  Problem: Acute Rehab PT Goals(only PT should resolve) Goal: Pt Will Go Supine/Side To Sit Outcome: Progressing Flowsheets (Taken 06/10/2019 1214) Pt will go Supine/Side to Sit: with modified independence Goal: Pt Will Go Sit To Supine/Side Outcome: Progressing Flowsheets (Taken 06/10/2019 1214) Pt will go Sit to Supine/Side: with modified independence Goal: Patient Will Transfer Sit To/From Stand Outcome: Progressing Flowsheets (Taken 06/10/2019 1214) Patient will transfer sit to/from stand: with supervision Goal: Pt Will Transfer Bed To Chair/Chair To Bed Outcome: Progressing Flowsheets (Taken 06/10/2019 1214) Pt will Transfer Bed to Chair/Chair to Bed: with supervision Goal: Pt Will Ambulate Outcome: Progressing Flowsheets (Taken 06/10/2019 1214) Pt will Ambulate:  > 125 feet  with rolling walker  with supervision Goal: Pt/caregiver will Perform Home Exercise Program Outcome: Progressing Flowsheets (Taken 06/10/2019 1214) Pt/caregiver will Perform Home Exercise Program:  For increased strengthening  For improved balance  Independently  12:15 PM, 06/10/19 Mearl Latin PT, DPT Physical Therapist at [facility]

## 2019-06-10 NOTE — Discharge Instructions (Signed)
1) follow-up with primary care physician Dr. Sallee Lange next week to discuss the results of your pleural fluid studies--- you may need referral to pulmonology for further evaluation

## 2019-06-10 NOTE — TOC Transition Note (Signed)
Transition of Care [facility]) - CM/SW Discharge Note   Patient Details  Name: Victoria Vasquez MRN: [id number] Date of Birth: 1928/04/05  [facility]) CM/SW Contact:  Aimee Heldman Dimitri Ped, LCSW Phone Number: 06/10/2019, 5:26 PM   Clinical Narrative:   Patient discharging home today with home health. CSW in contact with Delsa Bern PH: [phone] who accepted referral. CSW informed patients daughter Victoria Vasquez that patient would be stable and medically ready for discharge home today. Victoria Vasquez is agreeable and will be providing transportation home.   [place] Transitions of Care  Clinical Social Worker  Ph: [phone]    Final next level of care: [place] Barriers to Discharge: Equipment Delay   Patient Goals and CMS Choice Patient states their goals for this hospitalization and ongoing recovery are:: patient to discharge home with [facility] RN/PT      Discharge Placement                  Name of family member notified: Victoria Vasquez PH:[phone] Patient and family notified of of transfer: 06/10/19  Discharge Plan and Services                DME Arranged: [place] DME Agency: [employer]       HH Arranged: PT [place] Agency: [facility] [employer]) Date [place]: 06/10/19 Time [place]: 64 Representative spoke with at [place]: [place] Determinants of Health (SDOH) Interventions     Readmission Risk Interventions No flowsheet data found.

## 2019-06-10 NOTE — Plan of Care (Signed)
  Problem: Education: Goal: Knowledge of General Education information will improve Description: Including pain rating scale, medication(s)/side effects and non-pharmacologic comfort measures Outcome: Progressing   Problem: Health Behavior/Discharge Planning: Goal: Ability to manage health-related needs will improve Outcome: Progressing   Problem: Clinical Measurements: Goal: Will remain free from infection Outcome: Progressing   

## 2019-06-10 NOTE — Discharge Summary (Signed)
Victoria Vasquez, is a 86 y.o. female  DOB [birth year]  MRN [id number].  Admission date:  06/09/2019  Admitting Physician  Roxan Hockey, MD  Discharge Date:  06/10/2019   Primary MD  Kathyrn Drown, MD  Recommendations for primary care physician for things to follow:    1) follow-up with primary care physician Dr. Sallee Lange next week to discuss the results of your pleural fluid studies--- you may need referral to pulmonology for further evaluation  Admission Diagnosis  Pleural effusion [J90] Pleural effusion on right [J90] Dyspnea and respiratory abnormalities [R06.00, R06.89]   Discharge Diagnosis  Pleural effusion [J90] Pleural effusion on right [J90] Dyspnea and respiratory abnormalities [R06.00, R06.89]    Principal Problem:   Pleural effusion on right Active Problems:   Dyspnea and respiratory abnormalities   Hypertensive urgency   Uncontrolled type 2 diabetes mellitus with stage 3 chronic kidney disease (HCC)   Paget's disease and intraductal carcinoma of breast, left ([place])   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 Arthritis   . Asthma   . Cancer ([place])    BREAST CANCER LEFT BREAST  . Chest pain    Evaluation prior to 2003 reportedly including cath-records never located; Echo in 2009: Mild LVH, LAE2, nl EF, moderate LAE, MR1-2  . Chronic kidney disease   . COPD (chronic obstructive pulmonary disease) (HCC)    Exertional dyspnea; PFTs in 2002: Moderate small airway obstruction, mildly reduced lung volumes and DLCO, nl ABG  . Diabetes mellitus, type II ([place])   . Dyspnea   . Family history of adverse reaction to anesthesia    daugher has postop N&V  . Fatigue   . GERD (gastroesophageal reflux disease)   . Hyperlipidemia   . Hypertension   . Legally blind 1 1 2015  . Macular  degeneration of both eyes 1 1 2015  . Parotid mass 2003   Right-2003  . Scoliosis   . Shingles     Past Surgical History:  Procedure Laterality Date  . APPENDECTOMY  1951  . BREAST BIOPSY Left 10/05/2017   Procedure: BREAST BIOPSY;  Surgeon: Aviva Signs, MD;  Location: [facility];  Service: General;  Laterality: Left;  . BREAST LUMPECTOMY Left 03/18/2018  . BREAST LUMPECTOMY Left 03/18/2018   Procedure: LEFT BREAST CENTRAL  LUMPECTOMY;  Surgeon: Rolm Bookbinder, MD;  Location: [facility];  Service: General;  Laterality: Left;  . CARDIAC CATHETERIZATION  1999  . CHOLECYSTECTOMY  2003  . COLONOSCOPY  04/16/2006   [place]-Normal colon.  Exam limited due to retained particulate matter in the lumen of the colon, polyps less than 1 cm would have been easily missed.  Otherwise, no polyps, masses, inflammatory changes or vascular ectasia seen/ Normal retroflexed view of the rectum..  recommend repeat exam at 03/2011  . ORIF  1996  . ORIF ANKLE FRACTURE  1992  . THYROIDECTOMY N/A 11/03/2018   Procedure: TOTAL THYROIDECTOMY;  Surgeon: Michael Boston, MD;  Location: [facility];  Service: General;  Laterality: N/A;     HPI  from the history and physical done on the day of admission:      Victoria Vasquez  is a 86 y.o. female with past medical history relevant for prior history of malignant neoplasm of the isthmus status post total thyroidectomy 11/03/2018/history of Paget's disease and intraductal carcinoma of the left breast-, as well as history of uncontrolled HTN, uncontrolled DM, postsurgical hypothyroidism and CKD 3, as well as GERD who presents with concerns about worsening shortness of breath over the last 3 months -No frank chest pains per se patient does have some dyspnea at rest and some dyspnea on exertion -No leg pains or pleuritic symptoms -In ED she is found to have very elevated BP of 230/105 responded a bit to IV labetalol -No productive cough or fevers -In ED -Chest x-ray and CT chest with  moderate right-sided pleural effusion -I discussed case with on-call radiologist Dr. Candelaria Celeste was graciously agreed to do right-sided thoracentesis with retrieval of 1.3 L of yellow fluid, cytology Gram stain and cultures are pending -Echo was performed showing preserved EF and no wall motion normalities and no pulmonary hypertension  -CBC is unremarkable specifically no leukocytosis, -LFTs are not elevated, electrolytes are within normal limits, glucose is 175 and creatinine is 1.21 -Troponin is not elevated, BNP is 169 and COVID-19 test is negative  RN Kaitlyn at bedside   Hospital Course:    1)Dyspnea secondary to right-sided pleural effusion- -Dyspnea has been present for about 3 months but has progressively gotten worse -Chest x-ray and CT chest on admission with moderate right-sided effusion --status post thoracentesis with removal of 1.3 L of yellow fluid on 06/08/2018 -Dyspnea and hypoxia resolved after thoracentesis -Serum albumin is 4.2, pleural fluid albumin is 3.2, pleural fluid to the fluid is 4.5, serum total protein is 6.7 -Pleural fluid LDH is 124 , serum LDH 204 -Pleural fluid glucose is 160, serum glucose 175 -Pleural fluid neutrophil count is 14% with 1819 nucleated cells --Preliminary fluid studies reflect possibility of exudate with concerns about possible malignancy-  -Patient will follow up with PCP for further fluid studies/results and pleural fluid cytology and Gram stain with culture -Echocardiogram with EF of 55 to 60% with grade 1 diastolic dysfunction, no regional wall motion abnormalities, no pulmonary hypertension  2) hypertensive urgency--on admission BP was 230/105 with headaches and shortness of breath  -BP significantly improved after thoracentesis  3)DM2-recent A1c 8.3 reflecting uncontrolled DM, hold glipizide while in the hospital use Novolog/Humalog Sliding scale insulin with Accu-Cheks/Fingersticks as ordered   4)HCM--patient received  COVID-19 vaccination on 05/31/2019  5)HFpEF--in the setting of uncontrolled hypertension, presenting with worsening dyspnea --Echocardiogram with EF of 55 to 60% with grade 1 diastolic dysfunction, no regional wall motion abnormalities, no pulmonary hypertension -Treated with IV Lasix -Significantly improved after right-sided thoracentesis -Discharge on Lasix  6) hypothyroidism--- patient with history of postsurgical hypothyroidism, recent TSH was low, continue levothyroxine 143mcg daily, repeat TSH as outpatient with PCP  7)-prior history of malignant neoplasm of the isthmus status post total thyroidectomy 11/03/2018/history of Paget's disease and intraductal carcinoma of the left breast----await cytology on right-sided pleural effusion that was drained on 06/09/2019- -Preliminary fluid studies reflect possibility of exudate with concerns about possible malignancy-  -Patient will follow up with PCP for further fluid studies/results and pleural fluid cytology and Gram stain with culture --Patient may need referral to pulmonology for further work-up  Discharge Condition: stable, with resolved hypoxia and no significant dyspnea on exertion  Follow UP  Follow-up Information    Kathyrn Drown, MD On 06/16/2019.   Specialty: Family Medicine Why: Appointment Thursday Jan. 28, 2021 at 11:00am Contact information: [place] [address] [phone]            Diet and Activity recommendation:  As advised  Discharge Instructions    Discharge Instructions    Call MD for:  difficulty breathing, headache or visual disturbances   Complete by: As directed    Call MD for:  extreme fatigue   Complete by: As directed    Call MD for:  persistant dizziness or light-headedness   Complete by: As directed    Call MD for:  persistant nausea and vomiting   Complete by: As directed    Call MD for:  temperature >100.4   Complete by: As directed    Diet - low sodium  heart healthy   Complete by: As directed    Discharge instructions   Complete by: As directed    1) follow-up with primary care physician Dr. Sallee Lange next week to discuss the results of your pleural fluid studies--- you may need referral to pulmonology for further evaluation   Increase activity slowly   Complete by: As directed         Discharge Medications     Allergies as of 06/10/2019      Reactions   Lisinopril Cough   Statins Other (See Comments)   Myalgias      Medication List    STOP taking these medications   traMADol 50 MG tablet Commonly known as: ULTRAM     TAKE these medications   acetaminophen 500 MG tablet Commonly known as: TYLENOL Take 500 mg by mouth every 6 (six) hours as needed for moderate pain or headache.   albuterol 108 (90 Base) MCG/ACT inhaler Commonly known as: VENTOLIN HFA INHALE 2 PUFFS EVERY 4 HOURS AS NEEDED.   aspirin 81 MG tablet Take 1 tablet (81 mg total) by mouth daily with breakfast. What changed: when to take this   budesonide-formoterol 160-4.5 MCG/ACT inhaler Commonly known as: Symbicort USE 2 PUFFS TWICE DAILY FOR ASTHMA. RINSE MOUTH AFTER USE.   famotidine 20 MG tablet Commonly known as: Pepcid Take one tablet by mouth each night   fluticasone 50 MCG/ACT nasal spray Commonly known as: FLONASE Place 2 sprays into both nostrils daily. What changed:   when to take this  reasons to take this   furosemide 20 MG tablet Commonly known as: Lasix Take 1 tablet (20 mg total) by mouth daily.   glipiZIDE 5 MG tablet Commonly known as: GLUCOTROL TAKE 1 TABLET BY MOUTH EACH MORNING AND 1/2 TABLET AT SUPPER   latanoprost 0.005 % ophthalmic solution Commonly known as: XALATAN Place 1 drop into both eyes at bedtime.   levothyroxine 112 MCG tablet Commonly known as: SYNTHROID TAKE ONE TABLET BY MOUTH EVERY DAY BEFORE BREAKFAST   losartan 50 MG tablet Commonly known as: COZAAR Take 1 tablet (50 mg total) by mouth  daily.   pantoprazole 40 MG tablet Commonly known as: PROTONIX Take 1 tablet (40 mg total) by mouth daily.   PRESERVISION AREDS 2+MULTI VIT PO Take 1 capsule by mouth 2 (two) times daily.      Major procedures and Radiology Reports - PLEASE review detailed and final reports for all details, in brief -    DG Chest 1  View  Result Date: 06/09/2019 CLINICAL DATA:  RIGHT pleural effusion post thoracentesis EXAM: CHEST  1 VIEW COMPARISON:  CT chest and chest radiographs of 06/09/2019 FINDINGS: Rotated to the LEFT. Enlargement of cardiac silhouette. Atherosclerotic calcification aorta. Significantly decreased RIGHT pleural effusion and basilar atelectasis post thoracentesis. No pneumothorax. Bones demineralized. IMPRESSION: No pneumothorax following thoracentesis. Significantly decreased RIGHT pleural effusion and basilar atelectasis. Electronically Signed   By: Lavonia Dana M.D.   On: 06/09/2019 15:41   DG Chest 2 View  Result Date: 06/10/2019 CLINICAL DATA:  Status post right thoracentesis 1 day prior. Dyspnea, COPD EXAM: CHEST - 2 VIEW COMPARISON:  Chest radiograph from one day prior. FINDINGS: Stable cardiomediastinal silhouette with mild cardiomegaly and moderate hiatal hernia. No pneumothorax. Small right pleural effusion appears slightly decreased. No definite left pleural effusion. No pulmonary edema. Mild hazy bibasilar lung opacities appear unchanged. IMPRESSION: 1. No pneumothorax. 2. Small right pleural effusion, slightly decreased. 3. Stable mild cardiomegaly without pulmonary edema. 4. Stable mild hazy bibasilar lung opacities, favor atelectasis. 5. Moderate hiatal hernia. Electronically Signed   By: Ilona Sorrel M.D.   On: 06/10/2019 09:57   CT Chest W Contrast  Result Date: 06/09/2019 CLINICAL DATA:  Short of breath and chest pain x 2-3 months has gotten worse in the last week, hx copd, dm, left breast ca. EXAM: CT CHEST WITH CONTRAST TECHNIQUE: Multidetector CT imaging of the chest  was performed during intravenous contrast administration. CONTRAST:  66mL OMNIPAQUE IOHEXOL 300 MG/ML  SOLN COMPARISON:  Current chest radiograph. FINDINGS: Cardiovascular: Heart is normal in size and configuration. No pericardial effusion. Dense three-vessel coronary artery calcifications. The great vessels are normal in caliber. There is aortic atherosclerosis. Mediastinum/Nodes: No neck base or axillary masses or enlarged lymph nodes. No mediastinal or hilar masses or enlarged lymph nodes. Trachea is unremarkable. Esophagus is normal in course and in caliber. There is a small hiatal hernia. Lungs/Pleura: Moderate to large right pleural effusion. There is atelectasis of the right middle lobe and partial atelectasis of the upper and lower lobes. Linear area of opacity is noted in the left upper lobe anteriorly consistent with atelectasis or scarring. No convincing pneumonia. No evidence of pulmonary edema. No lung mass or nodule. No left pleural effusion. No pneumothorax. Upper Abdomen: No acute abnormality. Musculoskeletal: No fracture or acute finding. No osteoblastic or osteolytic lesions. IMPRESSION: 1. Moderate to large right pleural effusion associated with significant atelectasis, complete atelectasis of the right middle lobe and partial atelectasis in the right lower and upper lobes. No central obstruction. 2. No convincing pneumonia and no evidence of pulmonary edema. 3. Coronary artery calcifications.  Aortic atherosclerosis. Aortic Atherosclerosis (ICD10-I70.0). Electronically Signed   By: Lajean Manes M.D.   On: 06/09/2019 13:41   DG Chest Portable 1 View  Result Date: 06/09/2019 CLINICAL DATA:  Shortness of breath EXAM: PORTABLE CHEST 1 VIEW COMPARISON:  09/25/2017 FINDINGS: Rotated. Low lung volumes. There is a moderate right pleural effusion with partial atelectasis of the right lung. Left lung appears clear. Mild cardiomegaly. Hiatal hernia. IMPRESSION: Moderate right pleural effusion with  associated partial atelectasis of the right lung. Electronically Signed   By: Macy Mis M.D.   On: 06/09/2019 11:35   ECHOCARDIOGRAM COMPLETE  Result Date: 06/09/2019   ECHOCARDIOGRAM REPORT   Patient Name:   SAYLOR MURRY Date of Exam: 06/09/2019 Medical Rec #:  [id number]      Height:       63.0 in Accession #:    [id number]  Weight:       177.0 lb Date of Birth:  04/13/1928      BSA:          1.84 m Patient Age:    89 years       BP:           187/79 mmHg Patient Gender: F              HR:           64 bpm. Exam Location:  [facility] Procedure: 2D Echo, Cardiac Doppler and Color Doppler Indications:    Dyspnea 786.09 / R06.00  History:        Patient has prior history of Echocardiogram examinations, most                 recent 09/14/2012. COPD; Risk Factors:Hypertension, Diabetes and                 Dyslipidemia. Paget's disease and intraductal carcinoma of                 breast, left,Pleural effusion on right.  Sonographer:    Alvino Chapel RCS Referring Phys: [phone] Keira Bohlin IMPRESSIONS  1. Left ventricular ejection fraction, by visual estimation, is 55 to 60%. The left ventricle has normal function. There is mildly increased left ventricular hypertrophy.  2. Elevated left ventricular end-diastolic pressure.  3. Left ventricular diastolic parameters are consistent with Grade I diastolic dysfunction (impaired relaxation).  4. The left ventricle has no regional wall motion abnormalities.  5. Global right ventricle has normal systolic function.The right ventricular size is normal. No increase in right ventricular wall thickness.  6. Left atrial size was normal.  7. Right atrial size was normal.  8. Mild mitral annular calcification.  9. The mitral valve is grossly normal. No evidence of mitral valve regurgitation. 10. The tricuspid valve is grossly normal. 11. The tricuspid valve is grossly normal. Tricuspid valve regurgitation is trivial. 12. The aortic valve is tricuspid. Aortic valve  regurgitation is not visualized. 13. Pulmonic regurgitation is mild. 14. The pulmonic valve was grossly normal. Pulmonic valve regurgitation is mild. 15. Normal pulmonary artery systolic pressure. 16. The inferior vena cava is normal in size with greater than 50% respiratory variability, suggesting right atrial pressure of 3 mmHg. FINDINGS  Left Ventricle: Left ventricular ejection fraction, by visual estimation, is 55 to 60%. The left ventricle has normal function. The left ventricle has no regional wall motion abnormalities. The left ventricular internal cavity size was the left ventricle is normal in size. There is mildly increased left ventricular hypertrophy. Concentric left ventricular hypertrophy. Left ventricular diastolic parameters are consistent with Grade I diastolic dysfunction (impaired relaxation). Elevated left ventricular end-diastolic pressure. Right Ventricle: The right ventricular size is normal. No increase in right ventricular wall thickness. Global RV systolic function is has normal systolic function. The tricuspid regurgitant velocity is 1.40 m/s, and with an assumed right atrial pressure  of 3 mmHg, the estimated right ventricular systolic pressure is normal at 10.8 mmHg. Left Atrium: Left atrial size was normal in size. Right Atrium: Right atrial size was normal in size Pericardium: There is no evidence of pericardial effusion. Mitral Valve: The mitral valve is grossly normal. Mild mitral annular calcification. No evidence of mitral valve regurgitation. Tricuspid Valve: The tricuspid valve is grossly normal. Tricuspid valve regurgitation is trivial. Aortic Valve: The aortic valve is tricuspid. . There is mild thickening of the aortic valve. Aortic valve regurgitation is not  visualized. There is mild thickening of the aortic valve. Pulmonic Valve: The pulmonic valve was grossly normal. Pulmonic valve regurgitation is mild. Pulmonic regurgitation is mild. Aorta: The aortic root is normal in  size and structure. Venous: The inferior vena cava is normal in size with greater than 50% respiratory variability, suggesting right atrial pressure of 3 mmHg. IAS/Shunts: No atrial level shunt detected by color flow Doppler.  LEFT VENTRICLE PLAX 2D LVIDd:         4.44 cm  Diastology LVIDs:         3.13 cm  LV e' lateral:   6.53 cm/s LV PW:         1.17 cm  LV E/e' lateral: 15.8 LV IVS:        1.29 cm  LV e' medial:    4.46 cm/s LVOT diam:     2.10 cm  LV E/e' medial:  23.1 LV SV:         51 ml LV SV Index:   26.46 LVOT Area:     3.46 cm  RIGHT VENTRICLE RV S prime:     14.70 cm/s TAPSE (M-mode): 1.5 cm LEFT ATRIUM             Index       RIGHT ATRIUM           Index LA diam:        3.60 cm 1.96 cm/m  RA Area:     10.60 cm LA Vol (A2C):   45.4 ml 24.73 ml/m RA Volume:   20.90 ml  11.38 ml/m LA Vol (A4C):   47.5 ml 25.87 ml/m LA Biplane Vol: 46.4 ml 25.27 ml/m  AORTIC VALVE LVOT Vmax:   118.00 cm/s LVOT Vmean:  71.900 cm/s LVOT VTI:    0.242 m  AORTA Ao Root diam: 3.00 cm MITRAL VALVE                         TRICUSPID VALVE MV Area (PHT): 3.77 cm              TR Peak grad:   7.8 mmHg MV PHT:        58.29 msec            TR Vmax:        140.00 cm/s MV Decel Time: 201 msec MV E velocity: 103.00 cm/s 103 cm/s  SHUNTS MV A velocity: 124.00 cm/s 70.3 cm/s Systemic VTI:  0.24 m MV E/A ratio:  0.83        1.5       Systemic Diam: 2.10 cm  Kate Sable MD Electronically signed by Kate Sable MD Signature Date/Time: 06/09/2019/4:13:25 PM    Final    US THORACENTESIS ASP PLEURAL SPACE W/IMG GUIDE  Result Date: 06/09/2019 INDICATION: RIGHT pleural effusion EXAM: ULTRASOUND GUIDED DIAGNOSTIC AND THERAPEUTIC RIGHT THORACENTESIS MEDICATIONS: None COMPLICATIONS: None immediate PROCEDURE: An ultrasound guided thoracentesis was thoroughly discussed with the patient and questions answered. The benefits, risks, alternatives and complications were also discussed. The patient understands and wishes to proceed with  the procedure. Written consent was obtained. Ultrasound was performed to localize and mark an adequate pocket of fluid in the RIGHT chest. The area was then prepped and draped in the normal sterile fashion. 1% Lidocaine was used for local anesthesia. Under ultrasound guidance a 8 French thoracentesis catheter was introduced. Thoracentesis was performed. The catheter was removed and a dressing applied. Procedure tolerated very well by  patient without immediate complication. FINDINGS: A total of approximately 1.3 L of yellow RIGHT pleural fluid was removed. Samples were sent to the laboratory as requested by the clinical team. IMPRESSION: Successful ultrasound guided RIGHT thoracentesis yielding 1.3 L of pleural fluid. Electronically Signed   By: Lavonia Dana M.D.   On: 06/09/2019 15:46    Micro Results    Recent Results (from the past 240 hour(s))  Respiratory Panel by RT PCR (Flu A&B, Covid) - Nasopharyngeal Swab     Status: None   Collection Time: 06/09/19 11:11 AM   Specimen: Nasopharyngeal Swab  Result Value Ref Range Status   SARS Coronavirus 2 by RT PCR NEGATIVE NEGATIVE Final    Comment: (NOTE) SARS-CoV-2 target nucleic acids are NOT DETECTED. The SARS-CoV-2 RNA is generally detectable in upper respiratoy specimens during the acute phase of infection. The lowest concentration of SARS-CoV-2 viral copies this assay can detect is 131 copies/mL. A negative result does not preclude SARS-Cov-2 infection and should not be used as the sole basis for treatment or other patient management decisions. A negative result may occur with  improper specimen collection/handling, submission of specimen other than nasopharyngeal swab, presence of viral mutation(s) within the areas targeted by this assay, and inadequate number of viral copies (<131 copies/mL). A negative result must be combined with clinical observations, patient history, and epidemiological information. The expected result is  Negative. Fact Sheet for Patients:  PinkCheek.be Fact Sheet for Healthcare Providers:  GravelBags.it This test is not yet ap proved or cleared by the [place] FDA and  has been authorized for detection and/or diagnosis of SARS-CoV-2 by FDA under an Emergency Use Authorization (EUA). This EUA will remain  in effect (meaning this test can be used) for the duration of the COVID-19 declaration under Section 564(b)(1) of the Act, 21 U.S.C. section 360bbb-3(b)(1), unless the authorization is terminated or revoked sooner.    Influenza A by PCR NEGATIVE NEGATIVE Final   Influenza B by PCR NEGATIVE NEGATIVE Final    Comment: (NOTE) The Xpert Xpress SARS-CoV-2/FLU/RSV assay is intended as an aid in  the diagnosis of influenza from Nasopharyngeal swab specimens and  should not be used as a sole basis for treatment. Nasal washings and  aspirates are unacceptable for Xpert Xpress SARS-CoV-2/FLU/RSV  testing. Fact Sheet for Patients: PinkCheek.be Fact Sheet for Healthcare Providers: GravelBags.it This test is not yet approved or cleared by the [place] FDA and  has been authorized for detection and/or diagnosis of SARS-CoV-2 by  FDA under an Emergency Use Authorization (EUA). This EUA will remain  in effect (meaning this test can be used) for the duration of the  Covid-19 declaration under Section 564(b)(1) of the Act, 21  U.S.C. section 360bbb-3(b)(1), unless the authorization is  terminated or revoked. Performed at [facility], [address], [place], [place] [address]   Gram stain     Status: None   Collection Time: 06/09/19  3:06 PM   Specimen: PATH Cytology Pleural fluid  Result Value Ref Range Status   Specimen Description PLEURAL  Final   Special Requests NONE  Final   Gram Stain   Final    CYTOSPIN SMEAR NO ORGANISMS SEEN WBC PRESENT, PREDOMINANTLY  MONONUCLEAR [facility] Performed at [facility], [address] [place], [place] [address]    Report Status 06/09/2019 FINAL  Final  Culture, body fluid-bottle     Status: None (Preliminary result)   Collection Time: 06/09/19  3:06 PM   Specimen: Pleura  Result Value Ref Range Status   Specimen Description PLEURAL  Final   Special Requests BOTTLES DRAWN AEROBIC AND ANAEROBIC 10CC  Final   Culture   Final    NO GROWTH < 24 HOURS Performed at [facility], [address], [place], [place] [address]    Report Status PENDING  Incomplete       Today   Subjective    Cherisse Carrell today has no new complaints -Hypoxia and dyspnea has resolved after thoracentesis          Patient has been seen and examined prior to discharge   Objective   Blood pressure (!) 111/50, pulse 70, temperature 98.2 F (36.8 C), temperature source Oral, resp. rate 16, height 5\' 3"  (1.6 m), weight 77 kg, SpO2 97 %.   Intake/Output Summary (Last 24 hours) at 06/10/2019 1610 Last data filed at 06/10/2019 1300 Gross per 24 hour  Intake 480 ml  Output 1300 ml  Net -820 ml    Exam Gen:- Awake Alert, no acute distress, speaking in sentences HEENT:- [place].AT, No sclera icterus Neck-Supple Neck,No JVD,.  Lungs-improved air movement on the right after thoracentesis, no wheezing  CV- S1, S2 normal, regular Abd-  +ve B.Sounds, Abd Soft, No tenderness,    Extremity/Skin:- No  edema,   good pulses Psych-affect is appropriate, oriented x3 Neuro-no new focal deficits, no tremors    Data Review   CBC w Diff:  Lab Results  Component Value Date   WBC 7.5 06/10/2019   HGB 12.7 06/10/2019   HGB 12.8 05/03/2019   HCT 39.3 06/10/2019   HCT 38.0 05/03/2019   HCT 36 01/22/2012   PLT 175 06/10/2019   PLT 188 05/03/2019   LYMPHOPCT 30 06/09/2019   MONOPCT 14 06/09/2019   EOSPCT 2 06/09/2019   BASOPCT 1 06/09/2019    CMP:  Lab Results  Component Value Date   NA 141 06/10/2019   NA 142 05/03/2019   K  3.9 06/10/2019   CL 103 06/10/2019   CO2 27 06/10/2019   BUN 26 (H) 06/10/2019   BUN 24 05/03/2019   CREATININE 1.44 (H) 06/10/2019   CREATININE 1.05 08/17/2013   PROT 6.2 (L) 06/10/2019   PROT 6.6 05/03/2019   PROT 5.7 01/22/2012   ALBUMIN 3.9 06/10/2019   ALBUMIN 4.5 05/03/2019   BILITOT 0.8 06/10/2019   BILITOT 0.5 05/03/2019   BILITOT 0.4 01/22/2012   ALKPHOS 75 06/10/2019   ALKPHOS 48 01/22/2012   AST 16 06/10/2019   AST 14 01/22/2012   ALT 13 06/10/2019  .   Total Discharge time is about 33 minutes  Roxan Hockey M.D on 06/10/2019 at 4:10 PM  Go to [email or web address] -  for contact info  [facility] - Office  [phone]

## 2019-06-12 NOTE — Telephone Encounter (Signed)
Nurses Please reach out to the patient-this patient will need a follow-up office visit later this week.  Find out from the patient if she needs anything specifically currently and make sure she is hanging in there.  She was just discharged from the hospital.  Please document if she needs anything or not also document how she is doing and also set her up for the follow-up office visit thanks-Dr. Nicki Reaper

## 2019-06-13 ENCOUNTER — Other Ambulatory Visit: Payer: Self-pay

## 2019-06-13 MED ORDER — GLUCOSE BLOOD VI STRP: ORAL_STRIP | 11 refills | Status: AC

## 2019-06-13 NOTE — Telephone Encounter (Signed)
Glucose strips sent in and pt is aware

## 2019-06-13 NOTE — Telephone Encounter (Signed)
Pt contacted and states she is doing ok. Pt states the only thing she needs right now is test strips. Pt is checking sugars BID. Pt has follow up scheduled for 06/16/2019.

## 2019-06-13 NOTE — Telephone Encounter (Signed)
Please go ahead with the test strips, twice daily due to poorly controlled type 2 diabetes 1 month supply 11 refills

## 2019-06-14 LAB — CULTURE, BODY FLUID W GRAM STAIN -BOTTLE: Culture: NO GROWTH

## 2019-06-15 ENCOUNTER — Telehealth: Payer: Self-pay | Admitting: Family Medicine

## 2019-06-15 NOTE — Telephone Encounter (Signed)
Patient is requesting refill on test stripes twice a day to be called into [place]

## 2019-06-15 NOTE — Telephone Encounter (Signed)
Contacted patient due to sending in test strips to pharmacy on Monday. Pt states pharmacy told her they could not be filled until 07/14/19 due to testing once a day. [facility] to see if we could get these approved to be filled today. The new script that was sent in on Monday did not get updated in system. Spoke with pharmacist and she updated script and will be able to fill in the morning. Contacted patient to let her know; pt verbalized understanding.   Pt had phone visit scheduled for tomorrow but would like to come in. Spoke with Dr.Scott while patient was on phone and provider states it is ok to come in the office and to bring all meds if possible. Pt verbalized understanding.

## 2019-06-16 ENCOUNTER — Encounter: Payer: Self-pay | Admitting: Family Medicine

## 2019-06-16 ENCOUNTER — Telehealth: Payer: Self-pay | Admitting: Family Medicine

## 2019-06-16 ENCOUNTER — Ambulatory Visit (INDEPENDENT_AMBULATORY_CARE_PROVIDER_SITE_OTHER): Payer: PPO | Admitting: Family Medicine

## 2019-06-16 ENCOUNTER — Other Ambulatory Visit: Payer: Self-pay | Admitting: Family Medicine

## 2019-06-16 ENCOUNTER — Other Ambulatory Visit: Payer: Self-pay

## 2019-06-16 VITALS — BP 118/68 | Temp 97.6°F

## 2019-06-16 DIAGNOSIS — J90 Pleural effusion, not elsewhere classified: Secondary | ICD-10-CM | POA: Diagnosis not present

## 2019-06-16 DIAGNOSIS — J441 Chronic obstructive pulmonary disease with (acute) exacerbation: Secondary | ICD-10-CM

## 2019-06-16 DIAGNOSIS — R0602 Shortness of breath: Secondary | ICD-10-CM | POA: Diagnosis not present

## 2019-06-16 DIAGNOSIS — E1122 Type 2 diabetes mellitus with diabetic chronic kidney disease: Secondary | ICD-10-CM

## 2019-06-16 DIAGNOSIS — N289 Disorder of kidney and ureter, unspecified: Secondary | ICD-10-CM | POA: Diagnosis not present

## 2019-06-16 DIAGNOSIS — E1121 Type 2 diabetes mellitus with diabetic nephropathy: Secondary | ICD-10-CM | POA: Diagnosis not present

## 2019-06-16 DIAGNOSIS — R358 Other polyuria: Secondary | ICD-10-CM | POA: Diagnosis not present

## 2019-06-16 DIAGNOSIS — E1165 Type 2 diabetes mellitus with hyperglycemia: Secondary | ICD-10-CM

## 2019-06-16 DIAGNOSIS — M546 Pain in thoracic spine: Secondary | ICD-10-CM | POA: Diagnosis not present

## 2019-06-16 DIAGNOSIS — G8929 Other chronic pain: Secondary | ICD-10-CM | POA: Diagnosis not present

## 2019-06-16 DIAGNOSIS — R3589 Other polyuria: Secondary | ICD-10-CM

## 2019-06-16 DIAGNOSIS — N183 Chronic kidney disease, stage 3 unspecified: Secondary | ICD-10-CM | POA: Diagnosis not present

## 2019-06-16 DIAGNOSIS — IMO0002 Reserved for concepts with insufficient information to code with codable children: Secondary | ICD-10-CM

## 2019-06-16 LAB — CYTOLOGY - NON PAP

## 2019-06-16 LAB — POCT URINALYSIS DIPSTICK
Glucose, UA: POSITIVE — AB
Spec Grav, UA: 1.015 (ref 1.010–1.025)
pH, UA: 7 (ref 5.0–8.0)

## 2019-06-16 MED ORDER — LOSARTAN POTASSIUM 25 MG PO TABS: 25.0000 mg | ORAL_TABLET | Freq: Every day | ORAL | 0 refills | Status: AC

## 2019-06-16 NOTE — Telephone Encounter (Signed)
Left messge on nurse voicemail

## 2019-06-16 NOTE — Progress Notes (Addendum)
   Subjective:    Patient ID: Victoria Vasquez, female    DOB: Mar 08, 1928, 86 y.o.   MRN: [id number]  HPI  Patient arrives for a follow up on recent hospitalization for pleural effusion. Fall Risk  05/03/2019 12/17/2018 12/10/2018 12/10/2018 07/20/2017  Falls in the past year? 0 0 0 0 Yes  Comment - Emmi Telephone Survey: data to providers prior to load - - -  Number falls in past yr: - - - - 1  Injury with Fall? - - - - No  Risk for fall due to : Impaired balance/gait;Impaired mobility;Impaired vision - - - -  Follow up Falls evaluation completed - Falls evaluation completed Falls evaluation completed Education provided      Review of Systems     Objective:   Physical Exam        Assessment & Plan:  1. Polyuria Patient with intermittent urinary issues urinalysis negative for infection.  Could be related to the Lasix she was recently put on - POCT urinalysis dipstick  2. Pleural effusion Patient with pleural effusion.  We will do a follow-up chest x-ray in 1 week.  Cytology shows adenocarcinoma.  This will need further delineation.  Referral to oncology.  Patient is having some pain in her lower thoracic spine that has been present over the past couple months.  More than likely would benefit from PET scan-we will reach out to oncology to see how they would like for [place] to proceed - DG Chest 2 View  3. Diabetic nephropathy with proteinuria [facility]) Patient with difficult to control diabetes.  She has been in a situation where insulin was not feasible for her.  She could not afford some of the higher cost medicine such as Januvia.  Therefore she is on glipizide with knowledge that her glucose readings are running a little higher than what we would like to see.  Await A1c if necessary initiate once daily insulin  4. Renal insufficiency Check metabolic 7 especially with Lasix being initiated recently - Basic metabolic panel  5. Chronic bilateral thoracic back pain Please see notations  above  6. Uncontrolled type 2 diabetes mellitus with stage 3 chronic kidney disease ([place]) Please see notations above - Hemoglobin A1c Greater than 40 minutes spent with patient between the combination of face-to-face discussions with the patient as well as reviewing the hospital records reviewing the labs and the x-rays with the patient and tracking down cytology which was posted after the office visit but I was able to bring the patient back in from [place] to sit down and have further discussion regarding the cytology report

## 2019-06-16 NOTE — Telephone Encounter (Signed)
Spoke with [facility], the person that I need to speak with is not in at this time but lab representative took a message and will give a call back.

## 2019-06-16 NOTE — Telephone Encounter (Signed)
Nurse-Melissa from [facility] needs verbal order for physical therapy for [phone]

## 2019-06-16 NOTE — Telephone Encounter (Signed)
May have verbal order °

## 2019-06-16 NOTE — Telephone Encounter (Signed)
Lab results received and provider spoke with patient. Urgent referral sent to oncology

## 2019-06-16 NOTE — Telephone Encounter (Signed)
Nurses This patient had a pleural effusion in the hospital. The fluid was removed and sent to pathology for cytology. This was on 21 January. Please call the lab at [place] out from them what is the status of path report for cytology Inform them that epic says that it is currently listed as "active" with no result-this is unusual typically results were posted by now Please see what they have to say regarding this and how do we #1 get results?  Number 2 can they try to make sure that this test was not lost?  The results are very important for the patient management

## 2019-06-17 LAB — BASIC METABOLIC PANEL
BUN/Creatinine Ratio: 27 (ref 12–28)
BUN: 34 mg/dL — ABNORMAL HIGH (ref 8–27)
CO2: 25 mmol/L (ref 20–29)
Calcium: 8 mg/dL — ABNORMAL LOW (ref 8.7–10.3)
Chloride: 102 mmol/L (ref 96–106)
Creatinine, Ser: 1.28 mg/dL — ABNORMAL HIGH (ref 0.57–1.00)
GFR calc Af Amer: 43 mL/min/{1.73_m2} — ABNORMAL LOW (ref >59)
GFR calc non Af Amer: 37 mL/min/{1.73_m2} — ABNORMAL LOW (ref >59)
Glucose: 204 mg/dL — ABNORMAL HIGH (ref 65–99)
Potassium: 5.1 mmol/L (ref 3.5–5.2)
Sodium: 140 mmol/L (ref 134–144)

## 2019-06-17 LAB — HEMOGLOBIN A1C
Est. average glucose Bld gHb Est-mCnc: 212 mg/dL
Hgb A1c MFr Bld: 9 % — ABNORMAL HIGH (ref 4.8–5.6)

## 2019-06-17 NOTE — Progress Notes (Signed)
Referral placed in Epic. Left message on daughter [name]) voicemail to return cal to make sure they are aware of appt Monday morning.

## 2019-06-17 NOTE — Addendum Note (Signed)
Addended by: Vicente Males on: 06/17/2019 11:04 AM   Modules accepted: Orders

## 2019-06-17 NOTE — Progress Notes (Signed)
Pt daughter returned call and verbalized understanding.  

## 2019-06-21 DIAGNOSIS — E1142 Type 2 diabetes mellitus with diabetic polyneuropathy: Secondary | ICD-10-CM | POA: Diagnosis not present

## 2019-06-21 DIAGNOSIS — L97512 Non-pressure chronic ulcer of other part of right foot with fat layer exposed: Secondary | ICD-10-CM | POA: Diagnosis not present

## 2019-06-22 ENCOUNTER — Encounter: Payer: Self-pay | Admitting: Family Medicine

## 2019-06-22 NOTE — Progress Notes (Signed)
Patient Care Team: Kathyrn Drown, MD as PCP - General (Family Medicine) Danie Binder, MD (Gastroenterology) Lattie Haw Cristopher Estimable, MD as Attending Physician (Cardiology) Michael Boston, MD as Consulting Physician (General Surgery) Cassandria Anger, MD as Consulting Physician (Endocrinology)  DIAGNOSIS:    ICD-10-CM   1. 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 Primary malignant neoplasm of right lung metastatic to other site [facility])  C34.91     CHIEF COMPLIANT: Newly diagnosed metastatic adenocarcinoma of the lung  INTERVAL HISTORY: Victoria Vasquez is a 86 y.o. with above-mentioned history of Paget's disease of the left breast who was recently admitted to the hospital with shortness of breath and was found to have a large right pleural effusion.  Thoracentesis revealed metastatic adenocarcinoma.  TTF-1 was positive suggesting lung primary versus thyroid primary. The CT scans do not show any evidence of distant metastatic disease.  Her breathing is markedly better since they did the thoracentesis.  She is here accompanied by her daughter to discuss treatment options.  ALLERGIES:  is allergic to lisinopril and statins.  MEDICATIONS:  Current Outpatient Medications  Medication Sig Dispense Refill  . acetaminophen (TYLENOL) 500 MG tablet Take 500 mg by mouth every 6 (six) hours as needed for moderate pain or headache.    . albuterol (VENTOLIN HFA) 108 (90 Base) MCG/ACT inhaler INHALE 2 PUFFS EVERY 4 HOURS AS NEEDED. 8.5 g 5  . aspirin 81 MG tablet Take 1 tablet (81 mg total) by mouth daily with breakfast. 30 tablet 1  . budesonide-formoterol (SYMBICORT) 160-4.5 MCG/ACT inhaler USE 2 PUFFS TWICE DAILY FOR ASTHMA. RINSE MOUTH AFTER USE. 10.2 g 5  . fluticasone (FLONASE) 50 MCG/ACT nasal spray Place 2 sprays into both nostrils daily. (Patient taking differently: Place 2 sprays into both nostrils daily as needed for allergies. ) 16 g 6  . furosemide  (LASIX) 20 MG tablet Take 1 tablet (20 mg total) by mouth daily. 30 tablet 0  . glipiZIDE (GLUCOTROL) 5 MG tablet TAKE 1 TABLET BY MOUTH EACH MORNING AND 1/2 TABLET AT SUPPER 45 tablet 5  . glucose blood (ONE TOUCH ULTRA TEST) test strip USE TO TEST TWICE DAILY. DX;E11.9 100 each 11  . latanoprost (XALATAN) 0.005 % ophthalmic solution Place 1 drop into both eyes at bedtime.     Marland Kitchen levothyroxine (SYNTHROID) 112 MCG tablet TAKE ONE TABLET BY MOUTH EVERY DAY BEFORE BREAKFAST 90 tablet 0  . losartan (COZAAR) 25 MG tablet Take 1 tablet (25 mg total) by mouth daily. 30 tablet 0  . Multiple Vitamins-Minerals (PRESERVISION AREDS 2+MULTI VIT PO) Take 1 capsule by mouth 2 (two) times daily.     . pantoprazole (PROTONIX) 40 MG tablet Take 1 tablet (40 mg total) by mouth daily. 90 tablet 1   No current facility-administered medications for this visit.    PHYSICAL EXAMINATION: ECOG PERFORMANCE STATUS: 2 - Symptomatic, <50% confined to bed  Vitals:   06/23/19 0840  BP: (!) 168/91  Pulse: 71  Resp: 18  Temp: 98.3 F (36.8 C)  SpO2: 99%   Filed Weights   06/23/19 0840  Weight: 174 lb 14.4 oz (79.3 kg)     LABORATORY DATA:  I have reviewed the data as listed CMP Latest Ref Rng & Units 06/16/2019 06/10/2019 06/09/2019  Glucose 65 - 99 mg/dL 204(H) 185(H) 175(H)  BUN 8 - 27 mg/dL 34(H) 26(H) 23  Creatinine 0.57 - 1.00 mg/dL 1.28(H) 1.44(H) 1.21(H)  Sodium 134 -  144 mmol/L 140 141 140  Potassium 3.5 - 5.2 mmol/L 5.1 3.9 4.3  Chloride 96 - 106 mmol/L 102 103 104  CO2 20 - 29 mmol/L 25 27 27   Calcium 8.7 - 10.3 mg/dL 8.0(L) 8.8(L) 9.0  Total Protein 6.5 - 8.1 g/dL - 6.2(L) 6.7  Total Bilirubin 0.3 - 1.2 mg/dL - 0.8 0.8  Alkaline Phos 38 - 126 U/L - 75 84  AST 15 - 41 U/L - 16 17  ALT 0 - 44 U/L - 13 14    Lab Results  Component Value Date   WBC 7.5 06/10/2019   HGB 12.7 06/10/2019   HCT 39.3 06/10/2019   MCV 92.9 06/10/2019   PLT 175 06/10/2019   NEUTROABS 3.1 06/09/2019    ASSESSMENT  & PLAN:  Metastatic lung cancer (metastasis from lung to other site) [facility]) Malignant pleural effusion status post thoracentesis June 09, 2019: Metastatic adenocarcinoma.  Malignant cells positive for CK7, MOC-31, TTF-1, PAX-8 suggestive of lung adenocarcinoma (or thyroid carcinoma)  Prognosis discussion: I discussed with the patient and her family that malignant pleural effusion is now considered metastatic disease.  Given her history of COPD, I suspect that this is lung primary.  Given her advanced age and her comorbidities, I do not recommend any systemic therapies.  I recommend hospice.  We will consult hospice of [facility].  Patients with malignant pleural effusion can have relapses of recurrent pleural effusion causing shortness of breath.  She may need to undergo additional thoracentesis if necessary.  I discussed with the family that hospice can help with any oxygen requirements or the need for thoracentesis or Pleurx catheter placement.  Multiple comorbidities: Hypertension uncontrolled, uncontrolled diabetes, diastolic heart failure, hypothyroidism with a prior history of total thyroidectomy for thyroid cancer, Paget's disease of the breast untreated.  Return to clinic on an as-needed basis.  No orders of the defined types were placed in this encounter.  The patient has a good understanding of the overall plan. she agrees with it. she will call with any problems that may develop before the next visit here.  Total time spent: 30 mins including face to face time and time spent for planning, charting and coordination of care  Nicholas Lose, MD 06/23/2019  I, Cloyde Reams Dorshimer, am acting as scribe for Dr. Nicholas Lose.  I have reviewed the above documentation for accuracy and completeness, and I agree with the above.

## 2019-06-23 ENCOUNTER — Inpatient Hospital Stay: Payer: PPO | Attending: Hematology and Oncology | Admitting: Hematology and Oncology

## 2019-06-23 ENCOUNTER — Other Ambulatory Visit: Payer: Self-pay

## 2019-06-23 ENCOUNTER — Other Ambulatory Visit: Payer: Self-pay | Admitting: *Deleted

## 2019-06-23 ENCOUNTER — Other Ambulatory Visit: Payer: Self-pay | Admitting: Family Medicine

## 2019-06-23 VITALS — BP 168/91 | HR 71 | Temp 98.3°F | Resp 18 | Ht 63.0 in | Wt 174.9 lb

## 2019-06-23 DIAGNOSIS — Z79899 Other long term (current) drug therapy: Secondary | ICD-10-CM | POA: Diagnosis not present

## 2019-06-23 DIAGNOSIS — J910 Malignant pleural effusion: Secondary | ICD-10-CM | POA: Diagnosis not present

## 2019-06-23 DIAGNOSIS — E1165 Type 2 diabetes mellitus with hyperglycemia: Secondary | ICD-10-CM | POA: Diagnosis not present

## 2019-06-23 DIAGNOSIS — C3491 Malignant neoplasm of unspecified part of right bronchus or lung: Secondary | ICD-10-CM | POA: Diagnosis not present

## 2019-06-23 DIAGNOSIS — C3490 Malignant neoplasm of unspecified part of unspecified bronchus or lung: Secondary | ICD-10-CM | POA: Insufficient documentation

## 2019-06-23 DIAGNOSIS — J449 Chronic obstructive pulmonary disease, unspecified: Secondary | ICD-10-CM | POA: Diagnosis not present

## 2019-06-23 DIAGNOSIS — C50912 Malignant neoplasm of unspecified site of left female breast: Secondary | ICD-10-CM | POA: Diagnosis not present

## 2019-06-23 DIAGNOSIS — M546 Pain in thoracic spine: Secondary | ICD-10-CM

## 2019-06-23 DIAGNOSIS — J90 Pleural effusion, not elsewhere classified: Secondary | ICD-10-CM

## 2019-06-23 DIAGNOSIS — I10 Essential (primary) hypertension: Secondary | ICD-10-CM | POA: Insufficient documentation

## 2019-06-23 NOTE — Assessment & Plan Note (Signed)
Malignant pleural effusion status post thoracentesis June 09, 2019: Metastatic adenocarcinoma.  Malignant cells positive for CK7, MOC-31, TTF-1, PAX-8 suggestive of lung adenocarcinoma (or thyroid carcinoma)  Prognosis discussion: I discussed with the patient and her family that malignant pleural effusion is now considered metastatic disease.  Given her history of COPD, I suspect that this is lung primary.  Given her advanced age and her comorbidities, I do not recommend any systemic therapies.  I recommend hospice.  Patients with malignant pleural effusion can have relapses of recurrent pleural effusion causing shortness of breath.  She may need to undergo additional thoracentesis if necessary.  Multiple comorbidities: Hypertension uncontrolled, uncontrolled diabetes, diastolic heart failure, hypothyroidism with a prior history of total thyroidectomy for thyroid cancer, Paget's disease of the breast untreated.  Return to clinic on an as-needed basis.

## 2019-06-23 NOTE — Progress Notes (Signed)
Per MD request, referral placed for Hospice.  RN placed call to [facility] and spoke with Cassandra at intake and provided pt information.

## 2019-06-30 ENCOUNTER — Telehealth: Payer: Self-pay | Admitting: Family Medicine

## 2019-06-30 DIAGNOSIS — C799 Secondary malignant neoplasm of unspecified site: Secondary | ICD-10-CM

## 2019-06-30 NOTE — Telephone Encounter (Signed)
Nurses Please do referral to [facility] oncology at [facility] Metastatic cancer diagnosis

## 2019-06-30 NOTE — Telephone Encounter (Signed)
Daughter wants to talk to the nurse about some different things.  Said she called last week but did not receive a call back???  Said her mom was diagnosed with lung cancer and wanted to see if Dr. Nicki Reaper thought they should get a second opinion.    DPR on file to speak with daughter.

## 2019-06-30 NOTE — Telephone Encounter (Signed)
Please talk with the daughter I am more than happy to give her a call and talk with her. I tried calling the other day and I tried multiple multiple times and every time the number that I called did not accept my call it literally just was like a dead sound  Please see if there is a phone number or an additional phone number that I could call in I could do so at lunchtime or I can do this at the end of the day around approximately 5 PM depending on the flow of patients

## 2019-06-30 NOTE — Telephone Encounter (Signed)
Victoria Vasquez -[phone]- calling during lunch is ok with daughter

## 2019-06-30 NOTE — Telephone Encounter (Signed)
Daughter(DPR)  wanted to get Dr Bary Leriche opinion on moms treatment for lung cancer and second opinion. Daughter states the oncologist just told them their was nothing to be done and hospice has been contacted and they have come out. Daughter felt this was kinda cold response. Daughter wants to make sure you think this the right path is or should they seek out second opinion on treatment options.   Also daughter states that given the circumstances people such as nieces and such have been coming to visit her and they wear a mask but is it ok for her to have visitors in this circumstance

## 2019-07-01 NOTE — Telephone Encounter (Signed)
Nurses please move forward with putting in the referral [place] when the information has been sent to [facility] please let me know and also let me know a phone number for [facility] oncology I would like to call them try to talk with one of the providers about her case thank you

## 2019-07-01 NOTE — Telephone Encounter (Signed)
Referral ordered in EPIC. 

## 2019-07-04 DIAGNOSIS — J90 Pleural effusion, not elsewhere classified: Secondary | ICD-10-CM | POA: Diagnosis not present

## 2019-07-04 DIAGNOSIS — C73 Malignant neoplasm of thyroid gland: Secondary | ICD-10-CM | POA: Diagnosis not present

## 2019-07-04 DIAGNOSIS — C50912 Malignant neoplasm of unspecified site of left female breast: Secondary | ICD-10-CM | POA: Diagnosis not present

## 2019-07-04 DIAGNOSIS — C3490 Malignant neoplasm of unspecified part of unspecified bronchus or lung: Secondary | ICD-10-CM | POA: Diagnosis not present

## 2019-07-04 DIAGNOSIS — R0600 Dyspnea, unspecified: Secondary | ICD-10-CM | POA: Diagnosis not present

## 2019-07-09 LAB — FUNGUS CULTURE WITH STAIN

## 2019-07-09 LAB — FUNGUS CULTURE RESULT

## 2019-07-09 LAB — FUNGAL ORGANISM REFLEX

## 2019-07-11 ENCOUNTER — Ambulatory Visit (HOSPITAL_COMMUNITY)
Admission: RE | Admit: 2019-07-11 | Discharge: 2019-07-11 | Disposition: A | Discharge status: Home / Self Care | Payer: Medicare Other | Source: Ambulatory Visit | Attending: Family Medicine | Admitting: Family Medicine

## 2019-07-11 ENCOUNTER — Other Ambulatory Visit: Payer: Self-pay

## 2019-07-11 ENCOUNTER — Other Ambulatory Visit: Payer: Self-pay | Admitting: Family Medicine

## 2019-07-11 DIAGNOSIS — M549 Dorsalgia, unspecified: Secondary | ICD-10-CM | POA: Diagnosis not present

## 2019-07-11 DIAGNOSIS — J90 Pleural effusion, not elsewhere classified: Secondary | ICD-10-CM | POA: Diagnosis not present

## 2019-07-11 DIAGNOSIS — M546 Pain in thoracic spine: Secondary | ICD-10-CM

## 2019-07-15 ENCOUNTER — Ambulatory Visit (INDEPENDENT_AMBULATORY_CARE_PROVIDER_SITE_OTHER): Payer: Medicare Other | Admitting: Family Medicine

## 2019-07-15 ENCOUNTER — Other Ambulatory Visit: Payer: Self-pay

## 2019-07-15 DIAGNOSIS — C3491 Malignant neoplasm of unspecified part of right bronchus or lung: Secondary | ICD-10-CM | POA: Diagnosis not present

## 2019-07-15 NOTE — Progress Notes (Signed)
   Subjective:    Patient ID: Victoria Vasquez, female    DOB: May 02, 1928, 86 y.o.   MRN: [id number]  HPI Pt daughter,Donna, is wanting to discuss patient condition with provider.  This patient has terminal cancer Under hospice care Had shortness of breath and had to have fluid taken off had a follow-up x-ray which shows reaccumulation of fluid no warnings regarding shortness of breath currently no fevers or chills family doing a very good job of providing care for her patient tolerating the pain well Virtual Visit via Video Note  I connected with Victoria Vasquez on 07/15/19 at  2:00 PM EST by a video enabled telemedicine application and verified that I am speaking with the correct person using two identifiers.  Location: Patient: home Provider: office   I discussed the limitations of evaluation and management by telemedicine and the availability of in person appointments. The patient expressed understanding and agreed to proceed.  History of Present Illness:    Observations/Objective:   Assessment and Plan:   Follow Up Instructions:    I discussed the assessment and treatment plan with the patient. The patient was provided an opportunity to ask questions and all were answered. The patient agreed with the plan and demonstrated an understanding of the instructions.   The patient was advised to call back or seek an in-person evaluation if the symptoms worsen or if the condition fails to improve as anticipated.  I provided 22 minutes of non-face-to-face time during this encounter.       Review of Systems  Constitutional: Negative for activity change, appetite change and fatigue.  HENT: Negative for congestion and rhinorrhea.   Respiratory: Negative for cough and shortness of breath.   Cardiovascular: Negative for chest pain and leg swelling.  Gastrointestinal: Negative for abdominal pain and diarrhea.  Endocrine: Negative for polydipsia and polyphagia.  Skin: Negative for  color change.  Neurological: Negative for dizziness and weakness.  Psychiatric/Behavioral: Negative for behavioral problems and confusion.       Objective:   Physical Exam  Virtual visit unable to do exam not in respiratory distress      Assessment & Plan:  Abnormal chest x-ray Pleural fluid Hold off on draining this currently Pleurx catheter could be used if it becomes recurrent currently not a problem Follow-up again in 1 month Terminal metastatic cancer The daughter was told that it is possible we may need to reduce medications Also she was told that if shortness of breath occurs to follow-up immediately notify us we can help set up for a procedure with the interventional radiologist to drain the pleural fluid

## 2019-07-22 ENCOUNTER — Other Ambulatory Visit: Payer: Self-pay

## 2019-07-22 ENCOUNTER — Encounter: Payer: Self-pay | Admitting: Internal Medicine

## 2019-07-22 ENCOUNTER — Ambulatory Visit: Payer: PPO | Admitting: Internal Medicine

## 2019-07-22 VITALS — BP 118/64 | HR 72 | Temp 98.0°F | Ht 61.0 in | Wt 174.6 lb

## 2019-07-22 DIAGNOSIS — J910 Malignant pleural effusion: Secondary | ICD-10-CM

## 2019-07-22 DIAGNOSIS — C801 Malignant (primary) neoplasm, unspecified: Secondary | ICD-10-CM | POA: Diagnosis not present

## 2019-07-22 NOTE — Progress Notes (Signed)
Victoria Vasquez    [id number]    1928/01/14  Primary Care Physician:Luking, Elayne Snare, MD  Referring Physician: Kathyrn Drown, [address] [place],  [place] [address] Reason for Consultation: malignant pleural effusion Date of Consultation: 07/22/2019  Chief complaint:   Chief Complaint  Patient presents with  . Follow-up    Referred by Dr. Wolfgang Phoenix. Patient reports sob with and without exertion due to fluid on the lungs.      HPI: History of Paget's disease of the left breast with a new diagnosis of metastatic adenocarcinoma of the lung, with malignant effusion.  She had a thoracentesis done for right-sided pleural effusion while hospitalized earlier this year.  This did come back positive for malignant cells.  She is here today with her daughter to discuss what to do about the worsening shortness of breath.  No wheezing or chest tightness.  She has a history of asthma and has an albuterol rescue inhaler but is not using it much.  Hospice services have been called at home and that she has been started on intermittent oxygen, although she is not wearing any today and is not desaturating.   Social History   Occupational History  . Occupation: Retired  Tobacco Use  . Smoking status: Passive Smoke Exposure - Never Smoker  . Smokeless tobacco: Never Used  Substance and Sexual Activity  . Alcohol use: No  . Drug use: No  . Sexual activity: Not on file    Relevant family history:  Family History  Problem Relation Age of Onset  . Stomach cancer Mother   . Alzheimer's disease Sister   . Colon cancer Neg Hx     Past Medical History:  Diagnosis Date  . Arthritis   . Asthma   . Cancer ([place])    BREAST CANCER LEFT BREAST  . Chest pain    Evaluation prior to 2003 reportedly including cath-records never located; Echo in 2009: Mild LVH, LAE2, nl EF, moderate LAE, MR1-2  . Chronic kidney disease   . COPD (chronic obstructive pulmonary disease) (HCC)    Exertional dyspnea; PFTs in 2002: Moderate small airway obstruction, mildly reduced lung volumes and DLCO, nl ABG  . Diabetes mellitus, type II ([place])   . Dyspnea   . Family history of adverse reaction to anesthesia    daugher has postop N&V  . Fatigue   . GERD (gastroesophageal reflux disease)   . Hyperlipidemia   . Hypertension   . Legally blind 1 1 2015  . Macular degeneration of both eyes 1 1 2015  . Parotid mass 2003   Right-2003  . Scoliosis   . Shingles     Past Surgical History:  Procedure Laterality Date  . APPENDECTOMY  1951  . BREAST BIOPSY Left 10/05/2017   Procedure: BREAST BIOPSY;  Surgeon: Aviva Signs, MD;  Location: [facility];  Service: General;  Laterality: Left;  . BREAST LUMPECTOMY Left 03/18/2018  . BREAST LUMPECTOMY Left 03/18/2018   Procedure: LEFT BREAST CENTRAL  LUMPECTOMY;  Surgeon: Rolm Bookbinder, MD;  Location: [facility];  Service: General;  Laterality: Left;  . CARDIAC CATHETERIZATION  1999  . CHOLECYSTECTOMY  2003  . COLONOSCOPY  04/16/2006   [place]-Normal colon.  Exam limited due to retained particulate matter in the lumen of the colon, polyps less than 1 cm would have been easily missed.  Otherwise, no polyps, masses, inflammatory changes or vascular ectasia seen/ Normal retroflexed view of the  rectum..  recommend repeat exam at 03/2011  . ORIF  1996  . ORIF ANKLE FRACTURE  1992  . THYROIDECTOMY N/A 11/03/2018   Procedure: TOTAL THYROIDECTOMY;  Surgeon: Michael Boston, MD;  Location: [facility];  Service: General;  Laterality: N/A;     Review of systems: Review of Systems  Constitutional: Negative for chills, fever and weight loss.  HENT: Negative for congestion, sinus pain and sore throat.   Eyes: Negative for discharge and redness.  Respiratory: Positive for shortness of breath. Negative for cough, hemoptysis, sputum production and wheezing.   Cardiovascular: Negative for chest pain, palpitations and leg swelling.  Gastrointestinal: Negative for  heartburn, nausea and vomiting.  Musculoskeletal: Negative for joint pain and myalgias.  Skin: Negative for rash.  Neurological: Negative for dizziness, tremors, focal weakness and headaches.  Endo/Heme/Allergies: Negative for environmental allergies.  Psychiatric/Behavioral: Negative for depression. The patient is not nervous/anxious.   All other systems reviewed and are negative.   Physical Exam: Blood pressure 118/64, pulse 72, temperature 98 F (36.7 C), temperature source Temporal, height 5\' 1"  (1.549 m), weight 174 lb 9.6 oz (79.2 kg), SpO2 97 %. Gen:      No acute distress, well-preserved 86 year old woman with kyphosis Lungs:    Breath sounds diminished in the right lung base.  No wheezes or crackles. CV:         Regular rate and rhythm; no murmurs, rubs, or gallops.  No pedal edema Skin:      Warm and dry; no rashes Neuro: normal speech   Data Reviewed/Medical Decision Making:  Independent interpretation of tests: Imaging: . Review of patient's chest x-ray which demonstrates reaccumulation of the right-sided pleural effusion.  The patient's images have been independently reviewed by me.    Labs:  Lab Results  Component Value Date   WBC 7.5 06/10/2019   HGB 12.7 06/10/2019   HCT 39.3 06/10/2019   MCV 92.9 06/10/2019   PLT 175 06/10/2019   Lab Results  Component Value Date   NA 140 06/16/2019   K 5.1 06/16/2019   CL 102 06/16/2019   CO2 25 06/16/2019     Immunization status:  Immunization History  Administered Date(s) Administered  . Influenza Split 03/07/2013  . Influenza,inj,Quad PF,6+ Mos 03/27/2014, 03/27/2015, 01/24/2016, 02/12/2017, 02/16/2018, 02/25/2019  . Pneumococcal Conjugate-13 05/18/2014  . Pneumococcal Polysaccharide-23 01/24/2016  . Zoster Recombinat (Shingrix) 06/23/2018, 01/28/2019    . I reviewed prior external note(s) from Dr. Lindi Adie, inpatient records, Dr. Wolfgang Phoenix . I reviewed the result(s) of the labs and imaging as noted above.  . I  have ordered   Discussion of management or test interpretation with another colleague Dr. Leory Plowman Icard regarding Pleurx catheter placement.   Assessment:  Metastatic adenocarcinoma the lung Malignant pleural effusion on the right-hand side.  Plan/Recommendations: Discussed with Ms. Kitchens and her daughter today management of malignant pleural effusion.  We discussed the following options: Thoracentesis, Pleurx catheter placement, pleurodesis.  I do not think she is a candidate for surgical pleurodesis.  She is currently exploring her options for treatment with chemotherapy.  While she is doing that I think either another thoracentesis versus Pleurx catheter placement would be reasonable options.  At this point given her comfort and the fact that she has home hospice services, she is elected to proceed with Pleurx placement next week.  We will call her to schedule and arrange for the week of March 8.  We discussed disease management and progression at length today.   Return to  Care: Return in about 4 weeks (around 08/19/2019).  Lenice Llamas, MD Pulmonary and [place]  CC: Kathyrn Drown, MD

## 2019-07-22 NOTE — Patient Instructions (Signed)
The patient should have follow up scheduled with myself in 1 months.    We will contact Monday about scheduling Pleur-x catheter.

## 2019-07-22 NOTE — H&P (View-Only) (Signed)
Victoria Vasquez    [id number]    12-06-1927  Primary Care Physician:Luking, Elayne Snare, MD  Referring Physician: Kathyrn Drown, [address] [place],  [place] [address] Reason for Consultation: malignant pleural effusion Date of Consultation: 07/22/2019  Chief complaint:   Chief Complaint  Patient presents with  . Follow-up    Referred by Dr. Wolfgang Phoenix. Patient reports sob with and without exertion due to fluid on the lungs.      HPI: History of Paget's disease of the left breast with a new diagnosis of metastatic adenocarcinoma of the lung, with malignant effusion.  She had a thoracentesis done for right-sided pleural effusion while hospitalized earlier this year.  This did come back positive for malignant cells.  She is here today with her daughter to discuss what to do about the worsening shortness of breath.  No wheezing or chest tightness.  She has a history of asthma and has an albuterol rescue inhaler but is not using it much.  Hospice services have been called at home and that she has been started on intermittent oxygen, although she is not wearing any today and is not desaturating.   Social History   Occupational History  . Occupation: Retired  Tobacco Use  . Smoking status: Passive Smoke Exposure - Never Smoker  . Smokeless tobacco: Never Used  Substance and Sexual Activity  . Alcohol use: No  . Drug use: No  . Sexual activity: Not on file    Relevant family history:  Family History  Problem Relation Age of Onset  . Stomach cancer Mother   . Alzheimer's disease Sister   . Colon cancer Neg Hx     Past Medical History:  Diagnosis Date  . Arthritis   . Asthma   . Cancer ([place])    BREAST CANCER LEFT BREAST  . Chest pain    Evaluation prior to 2003 reportedly including cath-records never located; Echo in 2009: Mild LVH, LAE2, nl EF, moderate LAE, MR1-2  . Chronic kidney disease   . COPD (chronic obstructive pulmonary disease) (HCC)    Exertional dyspnea; PFTs in 2002: Moderate small airway obstruction, mildly reduced lung volumes and DLCO, nl ABG  . Diabetes mellitus, type II ([place])   . Dyspnea   . Family history of adverse reaction to anesthesia    daugher has postop N&V  . Fatigue   . GERD (gastroesophageal reflux disease)   . Hyperlipidemia   . Hypertension   . Legally blind 1 1 2015  . Macular degeneration of both eyes 1 1 2015  . Parotid mass 2003   Right-2003  . Scoliosis   . Shingles     Past Surgical History:  Procedure Laterality Date  . APPENDECTOMY  1951  . BREAST BIOPSY Left 10/05/2017   Procedure: BREAST BIOPSY;  Surgeon: Aviva Signs, MD;  Location: [facility];  Service: General;  Laterality: Left;  . BREAST LUMPECTOMY Left 03/18/2018  . BREAST LUMPECTOMY Left 03/18/2018   Procedure: LEFT BREAST CENTRAL  LUMPECTOMY;  Surgeon: Rolm Bookbinder, MD;  Location: [facility];  Service: General;  Laterality: Left;  . CARDIAC CATHETERIZATION  1999  . CHOLECYSTECTOMY  2003  . COLONOSCOPY  04/16/2006   [place]-Normal colon.  Exam limited due to retained particulate matter in the lumen of the colon, polyps less than 1 cm would have been easily missed.  Otherwise, no polyps, masses, inflammatory changes or vascular ectasia seen/ Normal retroflexed view of the  rectum..  recommend repeat exam at 03/2011  . ORIF  1996  . ORIF ANKLE FRACTURE  1992  . THYROIDECTOMY N/A 11/03/2018   Procedure: TOTAL THYROIDECTOMY;  Surgeon: Michael Boston, MD;  [facility];  Service: General;  Laterality: N/A;     Review of systems: Review of Systems  Constitutional: Negative for chills, fever and weight loss.  HENT: Negative for congestion, sinus pain and sore throat.   Eyes: Negative for discharge and redness.  Respiratory: Positive for shortness of breath. Negative for cough, hemoptysis, sputum production and wheezing.   Cardiovascular: Negative for chest pain, palpitations and leg swelling.  Gastrointestinal: Negative for  heartburn, nausea and vomiting.  Musculoskeletal: Negative for joint pain and myalgias.  Skin: Negative for rash.  Neurological: Negative for dizziness, tremors, focal weakness and headaches.  Endo/Heme/Allergies: Negative for environmental allergies.  Psychiatric/Behavioral: Negative for depression. The patient is not nervous/anxious.   All other systems reviewed and are negative.   Physical Exam: Blood pressure 118/64, pulse 72, temperature 98 F (36.7 C), temperature source Temporal, height 5\' 1"  (1.549 m), weight 174 lb 9.6 oz (79.2 kg), SpO2 97 %. Gen:      No acute distress, well-preserved 86 year old woman with kyphosis Lungs:    Breath sounds diminished in the right lung base.  No wheezes or crackles. CV:         Regular rate and rhythm; no murmurs, rubs, or gallops.  No pedal edema Skin:      Warm and dry; no rashes Neuro: normal speech   Data Reviewed/Medical Decision Making:  Independent interpretation of tests: Imaging: . Review of patient's chest x-ray which demonstrates reaccumulation of the right-sided pleural effusion.  The patient's images have been independently reviewed by me.    Labs:  Lab Results  Component Value Date   WBC 7.5 06/10/2019   HGB 12.7 06/10/2019   HCT 39.3 06/10/2019   MCV 92.9 06/10/2019   PLT 175 06/10/2019   Lab Results  Component Value Date   NA 140 06/16/2019   K 5.1 06/16/2019   CL 102 06/16/2019   CO2 25 06/16/2019     Immunization status:  Immunization History  Administered Date(s) Administered  . Influenza Split 03/07/2013  . Influenza,inj,Quad PF,6+ Mos 03/27/2014, 03/27/2015, 01/24/2016, 02/12/2017, 02/16/2018, 02/25/2019  . Pneumococcal Conjugate-13 05/18/2014  . Pneumococcal Polysaccharide-23 01/24/2016  . Zoster Recombinat (Shingrix) 06/23/2018, 01/28/2019    . I reviewed prior external note(s) from Dr. Lindi Adie, inpatient records, Dr. Wolfgang Phoenix . I reviewed the result(s) of the labs and imaging as noted above.  . I  have ordered   Discussion of management or test interpretation with another colleague Dr. Leory Plowman Icard regarding Pleurx catheter placement.   Assessment:  Metastatic adenocarcinoma the lung Malignant pleural effusion on the right-hand side.  Plan/Recommendations: Discussed with Ms. Ballman and her daughter today management of malignant pleural effusion.  We discussed the following options: Thoracentesis, Pleurx catheter placement, pleurodesis.  I do not think she is a candidate for surgical pleurodesis.  She is currently exploring her options for treatment with chemotherapy.  While she is doing that I think either another thoracentesis versus Pleurx catheter placement would be reasonable options.  At this point given her comfort and the fact that she has home hospice services, she is elected to proceed with Pleurx placement next week.  We will call her to schedule and arrange for the week of March 8.  We discussed disease management and progression at length today.   Return to  Care: Return in about 4 weeks (around 08/19/2019).  Lenice Llamas, MD Pulmonary and [place]  CC: Kathyrn Drown, MD

## 2019-07-25 ENCOUNTER — Other Ambulatory Visit: Payer: Self-pay | Admitting: "Endocrinology

## 2019-07-25 ENCOUNTER — Other Ambulatory Visit (HOSPITAL_COMMUNITY): Payer: PPO

## 2019-07-25 ENCOUNTER — Telehealth: Payer: Self-pay | Admitting: *Deleted

## 2019-07-25 ENCOUNTER — Other Ambulatory Visit (HOSPITAL_COMMUNITY)
Admission: RE | Admit: 2019-07-25 | Discharge: 2019-07-25 | Disposition: A | Discharge status: Home / Self Care | Payer: Medicare Other | Source: Ambulatory Visit | Attending: Internal Medicine | Admitting: Internal Medicine

## 2019-07-25 DIAGNOSIS — Z20822 Contact with and (suspected) exposure to covid-19: Secondary | ICD-10-CM | POA: Insufficient documentation

## 2019-07-25 DIAGNOSIS — Z01812 Encounter for preprocedural laboratory examination: Secondary | ICD-10-CM | POA: Diagnosis present

## 2019-07-25 LAB — ACID FAST CULTURE WITH REFLEXED SENSITIVITIES (MYCOBACTERIA): Acid Fast Culture: NEGATIVE

## 2019-07-25 NOTE — Telephone Encounter (Signed)
Patient scheduled for pleurx cath at [facility] on March 11 at 9 am. Patient told to arrive at 0730. Covid test scheduled for March 8 at 11am.   Any further instructions please let me know to tell patient as in regarding medication  Routing to [place] as Juluis Rainier

## 2019-07-25 NOTE — Telephone Encounter (Signed)
-----   Message from Spero Geralds, MD sent at 07/22/2019  5:11 PM EST ----- Please schedule for Pleurx catheter the week of March 8th

## 2019-07-26 LAB — SARS CORONAVIRUS 2 (TAT 6-24 HRS): SARS Coronavirus 2: NEGATIVE

## 2019-07-27 ENCOUNTER — Telehealth: Payer: Self-pay | Admitting: Internal Medicine

## 2019-07-27 NOTE — Telephone Encounter (Signed)
Spoke with [name]  She is asking if pt needs to fast and if okay to take her ASA 81 mg before her pleurex procedure tomorrow  I advised no need to fast   Dr Shearon Stalls, please advise if she needs to hold the ASA today and tomorrow, thanks

## 2019-07-27 NOTE — Telephone Encounter (Signed)
Spoke with [name]. She verbalized understanding.   Nothing further needed at time of call.

## 2019-07-27 NOTE — Telephone Encounter (Signed)
Ok to take all meds the morning of the procedure. Also ok to eat breakfast. Diona Fanti ok.

## 2019-07-28 ENCOUNTER — Ambulatory Visit (HOSPITAL_COMMUNITY)

## 2019-07-28 ENCOUNTER — Telehealth: Payer: Self-pay | Admitting: Internal Medicine

## 2019-07-28 ENCOUNTER — Ambulatory Visit (HOSPITAL_COMMUNITY)
Admission: RE | Admit: 2019-07-28 | Discharge: 2019-07-28 | Disposition: A | Discharge status: Home / Self Care | Attending: Internal Medicine | Admitting: Internal Medicine

## 2019-07-28 ENCOUNTER — Encounter (HOSPITAL_COMMUNITY)
Admission: RE | Disposition: A | Discharge status: Home / Self Care | Payer: Self-pay | Source: Home / Self Care | Attending: Internal Medicine

## 2019-07-28 ENCOUNTER — Ambulatory Visit: Payer: PPO | Admitting: Family Medicine

## 2019-07-28 ENCOUNTER — Ambulatory Visit (HOSPITAL_COMMUNITY)
Admission: RE | Admit: 2019-07-28 | Discharge: 2019-07-28 | Disposition: A | Discharge status: Home / Self Care | Source: Ambulatory Visit | Attending: Internal Medicine | Admitting: Internal Medicine

## 2019-07-28 DIAGNOSIS — N189 Chronic kidney disease, unspecified: Secondary | ICD-10-CM | POA: Diagnosis not present

## 2019-07-28 DIAGNOSIS — J910 Malignant pleural effusion: Secondary | ICD-10-CM | POA: Insufficient documentation

## 2019-07-28 DIAGNOSIS — Z7722 Contact with and (suspected) exposure to environmental tobacco smoke (acute) (chronic): Secondary | ICD-10-CM | POA: Diagnosis not present

## 2019-07-28 DIAGNOSIS — E1122 Type 2 diabetes mellitus with diabetic chronic kidney disease: Secondary | ICD-10-CM | POA: Diagnosis not present

## 2019-07-28 DIAGNOSIS — M1990 Unspecified osteoarthritis, unspecified site: Secondary | ICD-10-CM | POA: Diagnosis not present

## 2019-07-28 DIAGNOSIS — K219 Gastro-esophageal reflux disease without esophagitis: Secondary | ICD-10-CM | POA: Diagnosis not present

## 2019-07-28 DIAGNOSIS — C3490 Malignant neoplasm of unspecified part of unspecified bronchus or lung: Secondary | ICD-10-CM | POA: Diagnosis not present

## 2019-07-28 DIAGNOSIS — H548 Legal blindness, as defined in USA: Secondary | ICD-10-CM | POA: Diagnosis not present

## 2019-07-28 DIAGNOSIS — J90 Pleural effusion, not elsewhere classified: Secondary | ICD-10-CM | POA: Diagnosis not present

## 2019-07-28 DIAGNOSIS — J449 Chronic obstructive pulmonary disease, unspecified: Secondary | ICD-10-CM | POA: Diagnosis not present

## 2019-07-28 DIAGNOSIS — Z853 Personal history of malignant neoplasm of breast: Secondary | ICD-10-CM | POA: Diagnosis not present

## 2019-07-28 DIAGNOSIS — I129 Hypertensive chronic kidney disease with stage 1 through stage 4 chronic kidney disease, or unspecified chronic kidney disease: Secondary | ICD-10-CM | POA: Insufficient documentation

## 2019-07-28 HISTORY — PX: CHEST TUBE INSERTION: SHX231

## 2019-07-28 SURGERY — INSERTION, PLEURAL DRAINAGE CATHETER
Anesthesia: Moderate Sedation | Laterality: Bilateral

## 2019-07-28 MED ORDER — IBUPROFEN 600 MG PO TABS
600.0000 mg | ORAL_TABLET | Freq: Once | ORAL | Status: AC
  Administered 2019-07-28: 11:00:00 600 mg via ORAL
  Filled 2019-07-28: qty 1

## 2019-07-28 NOTE — Progress Notes (Signed)
Patient in today for placement of Pleural Drainage Catheter. Assisted MD with procedure.  Pre Procedure Vitals BP: 149/83 HR: 82 RR: 20 Oxygen saturations  95% on RA   100% on 2 lpm [place] Post Procedure Vitals BP:  175/72 HR: 72 RR:  16 Oxygen saturation 100% on 2 lpm [place] VSS stable thru out the procedure.  Post chest x-ray ordered.

## 2019-07-28 NOTE — Interval H&P Note (Signed)
History and Physical Interval Note:  07/28/2019 8:17 AM  Victoria Vasquez  has presented today for surgery, with the diagnosis of Pleural effusion.  The various methods of treatment have been discussed with the patient and family. After consideration of risks, benefits and other options for treatment, the patient has consented to  Procedure(s): INSERTION PLEURAL DRAINAGE CATHETER (Bilateral) as a surgical intervention.  The patient's history has been reviewed, patient examined, no change in status, stable for surgery.  I have reviewed the patient's chart and labs.  Questions were answered to the patient's satisfaction.     Spero Geralds

## 2019-07-28 NOTE — Telephone Encounter (Signed)
Pleurx catheter placed 3/11 by ND. Needs follow up in 1 week for suture removal. Ok to schedule with JE, PC, DS or BI.

## 2019-07-28 NOTE — Progress Notes (Signed)
Md spoke with patient about discharge and her next steps in care. Told her she could leave when she was ready. RT assisted with dressing and pushed patient to front entrance, accompanied by daughter.

## 2019-07-28 NOTE — Procedures (Signed)
DATE OF PROCEDURE:  07/28/2019  PREOPERATIVE DIAGNOSIS:  Recurrent right pleural effusion.  POSTOPERATIVE DIAGNOSIS:  Recurrent right pleural effusion.  PROCEDURE PERFORMED:  Right PleurX catheter placement.  SURGEON:  Spero Geralds, MD ASSISTANT: June Leap, DO  ANESTHESIA:  Local with IV sedation.  COMPLICATIONS:  None.  INDICATION FOR PROCEDURE:  The patient is a 86 y.o. female with a history of lung cancer and right sided malignant pleural effusion. The patient was referred for a PleurX catheter placement for home drainage. The patient and her family understood the risks and possible complications of the procedure and wished to proceed.  OPERATIVE FINDINGS:  The right chest was opacified on preoperative chest x-ray, and 1 liters of serous fluid was withdrawn before clamping the drainage tube. There were no bleeding complications, and the fluid was not blood tinged.  DESCRIPTION OF PROCEDURE:  The patient was brought to the procedure room and placed supine on the operating table. The patient was then positioned with the head up and a roll under the right shoulder. The right chest and upper abdomen were prepped and draped in the usual sterile fashion. Lidocaine 1% was used to infiltrate two areas; one in the right upper quadrant where the tube would exit and the other along the anterior axillary line in the sixth intercostal space.  A small counterincision was made in the right upper quadrant area. Through the anterior axillary line area, the pleural space was accessed by Seldinger technique. The counterincision was made around the guidewire and then the PleurX catheter was tunneled from the right upper quadrant small incision to the one overlying the ribs. A sheath introducer was then passed over the wire and then the PleurX catheter was placed through the sheath introducer. There was good return of fluid. 1 liter of serous fluid was withdrawn slowly as the small counterincision was closed  with Monocryl stitch.  The catheter was capped off, and sterile dressings were applied. The patient tolerated the procedure well without any complications. The patient was transferred to the recovery room in stable condition. Sponge and needle count was correct at the end of the case.  The patient will have follow up with our office in 1 week for suture removal. She will be sent home with drainage bottle starter set.   Lenice Llamas, MD Pulmonary and [place] Pager: [phone]

## 2019-07-29 ENCOUNTER — Telehealth: Payer: Self-pay | Admitting: Internal Medicine

## 2019-07-29 ENCOUNTER — Encounter: Payer: Self-pay | Admitting: *Deleted

## 2019-07-29 NOTE — Telephone Encounter (Signed)
Thank you - I have clinic W-F and can squeeze her in at 10:15 if needed.

## 2019-07-29 NOTE — Telephone Encounter (Signed)
Called and spoke with patient, Only provider in office next week is Dr. Carlis Abbott. Only has 15 min slots blocked later spot so this patient could have her follow up and have the time needed.   Will route to [facility] and ND as FYI

## 2019-07-29 NOTE — Telephone Encounter (Signed)
Called and spoke with pt's daughter [name] letting her know that pt's appt time was only way we could get her worked in to Dr. Ainsley Spinner schedule on 3/18 and she verbalized understanding. Nothing further needed.

## 2019-08-04 ENCOUNTER — Other Ambulatory Visit: Payer: Self-pay

## 2019-08-04 ENCOUNTER — Encounter: Payer: Self-pay | Admitting: Critical Care Medicine

## 2019-08-04 ENCOUNTER — Ambulatory Visit (INDEPENDENT_AMBULATORY_CARE_PROVIDER_SITE_OTHER): Payer: Medicare Other | Admitting: Critical Care Medicine

## 2019-08-04 VITALS — BP 124/74 | HR 82 | Temp 97.1°F | Ht 64.0 in | Wt 174.0 lb

## 2019-08-04 DIAGNOSIS — J910 Malignant pleural effusion: Secondary | ICD-10-CM | POA: Diagnosis not present

## 2019-08-04 DIAGNOSIS — C3491 Malignant neoplasm of unspecified part of right bronchus or lung: Secondary | ICD-10-CM

## 2019-08-04 NOTE — Patient Instructions (Signed)
Thank you for visiting Dr. Carlis Abbott at [facility]. We recommend the following:   Call with any questions about pleurX catheter.  Follow up 4/12 as scheduled.    Please do your part to reduce the spread of COVID-19.

## 2019-08-04 NOTE — Progress Notes (Signed)
Synopsis: Referred in March 2021 for Pleurx management by Kathyrn Drown, MD  Subjective:   PATIENT ID: Victoria Vasquez GENDER: female DOB: 1928/05/17, MRN: [id number]  Chief Complaint  Patient presents with   Follow-up    Patient is here for follow up after having pleurx cath placed on 3/11. Patient is feeling much better since having it placed. Patient is very anxious about family draining it it was last drained on Saturday. Patient has not had to use oxygen since getting pleurx placed.    Victoria Vasquez is an 86 y/o woman with a history of MPE and stage 4 metastatic lung adenocarcinoma. She had recurrent pleural effusions with hypoxic respiratory failure prior to placement of a pleurX on 07/28/19.  Since her Pleurx was placed last week she is drained once with 500- 750 cc of fluid.  No redness or drainage around the catheter.  She has not had shortness of breath or needed oxygen since having her pleural fluid drained.  She has more energy and is able to walk around more easily with her walker.  Hospice is coming tomorrow, and they are planning to drain with them.  She had mild pain right before stopping drainage.  She recently followed up for a second opinion with oncologist Dr. Federico Flake at [facility] (note reviewed).  Due to frailty he was concerned that she was not a candidate for chemotherapy or immunotherapy.     Past Medical History:  Diagnosis Date   Arthritis    Asthma    Cancer ([place])    BREAST CANCER LEFT BREAST   Chest pain    Evaluation prior to 2003 reportedly including cath-records never located; Echo in 2009: Mild LVH, LAE2, nl EF, moderate LAE, MR1-2   Chronic kidney disease    COPD (chronic obstructive pulmonary disease) (HCC)    Exertional dyspnea; PFTs in 2002: Moderate small airway obstruction, mildly reduced lung volumes and DLCO, nl ABG   Diabetes mellitus, type II (HCC)    Dyspnea    Family history of adverse reaction to anesthesia    daugher has postop N&V   Fatigue    GERD (gastroesophageal reflux disease)    Hyperlipidemia    Hypertension    Legally blind 1 1 2015   Macular degeneration of both eyes 1 1 2015   Parotid mass 2003   Right-2003   Scoliosis    Shingles      Family History  Problem Relation Age of Onset   Stomach cancer Mother    Alzheimer's disease Sister    Colon cancer Neg Hx      Past Surgical History:  Procedure Laterality Date   APPENDECTOMY  1951   BREAST BIOPSY Left 10/05/2017   Procedure: BREAST BIOPSY;  Surgeon: Aviva Signs, MD;  Location: [facility];  Service: General;  Laterality: Left;   BREAST LUMPECTOMY Left 03/18/2018   BREAST LUMPECTOMY Left 03/18/2018   Procedure: LEFT BREAST CENTRAL  LUMPECTOMY;  Surgeon: Rolm Bookbinder, MD;  Location: [facility];  Service: General;  Laterality: Left;   [place] Bilateral 07/28/2019   Procedure: INSERTION PLEURAL DRAINAGE CATHETER;  Surgeon: Spero Geralds, MD;  Location: [facility] ENDOSCOPY;  Service: Pulmonary;  Laterality: Bilateral;   CHOLECYSTECTOMY  2003   COLONOSCOPY  04/16/2006   [place]-Normal colon.  Exam limited due to retained particulate matter in the lumen of the colon, polyps less than 1 cm would have been easily missed.  Otherwise, no  polyps, masses, inflammatory changes or vascular ectasia seen/ Normal retroflexed view of the rectum..  recommend repeat exam at 03/2011   ORIF  [place] N/A 11/03/2018   Procedure: TOTAL THYROIDECTOMY;  Surgeon: Michael Boston, MD;  Location: [facility];  Service: General;  Laterality: N/A;    Social History   Socioeconomic History   Marital status: Widowed    Spouse name: Not on file   Number of children: 3   Years of education: Not on file   Highest education level: Not on file  Occupational History   Occupation: Retired  Tobacco Use   Smoking status: Passive Smoke Exposure - Never Smoker     Smokeless tobacco: Never Used  Substance and Sexual Activity   Alcohol use: No   Drug use: No   Sexual activity: Not on file  Other Topics Concern   Not on file  Social History Narrative   Widowed   3 children   No regular exercise   Social Determinants of Health   Financial Resource Strain:    Difficulty of Paying Living Expenses:   Food Insecurity:    Worried About Charity fundraiser in the Last Year:    Arboriculturist in the Last Year:   Transportation Needs:    Film/video editor (Medical):    Lack of Transportation (Non-Medical):   Physical Activity:    Days of Exercise per Week:    Minutes of Exercise per Session:   Stress:    Feeling of Stress :   Social Connections:    Frequency of Communication with Friends and Family:    Frequency of Social Gatherings with Friends and Family:    Attends Religious Services:    Active Member of Clubs or Organizations:    Attends Archivist Meetings:    Marital Status:   Intimate Partner Violence:    Fear of Current or Ex-Partner:    Emotionally Abused:    Physically Abused:    Sexually Abused:      Allergies  Allergen Reactions   Lisinopril Cough   Statins Other (See Comments)    Myalgias     Immunization History  Administered Date(s) Administered   Influenza Split 03/07/2013   Influenza,inj,Quad PF,6+ Mos 03/27/2014, 03/27/2015, 01/24/2016, 02/12/2017, 02/16/2018, 02/25/2019   Moderna SARS-COVID-2 Vaccination 05/31/2019, 06/30/2019   Pneumococcal Conjugate-13 05/18/2014   Pneumococcal Polysaccharide-23 01/24/2016   Zoster Recombinat (Shingrix) 06/23/2018, 01/28/2019    Outpatient Medications Prior to Visit  Medication Sig Dispense Refill   acetaminophen (TYLENOL) 500 MG tablet Take 500 mg by mouth every 6 (six) hours as needed for moderate pain or headache.     albuterol (VENTOLIN HFA) 108 (90 Base) MCG/ACT inhaler INHALE 2 PUFFS EVERY 4 HOURS AS NEEDED. (Patient  taking differently: Inhale 2 puffs into the lungs every 4 (four) hours as needed for wheezing or shortness of breath. ) 8.5 g 5   aspirin 81 MG tablet Take 1 tablet (81 mg total) by mouth daily with breakfast. (Patient taking differently: Take 81 mg by mouth daily. ) 30 tablet 1   budesonide-formoterol (SYMBICORT) 160-4.5 MCG/ACT inhaler USE 2 PUFFS TWICE DAILY FOR ASTHMA. RINSE MOUTH AFTER USE. (Patient taking differently: Inhale 2 puffs into the lungs daily. ) 10.2 g 5   fluticasone (FLONASE) 50 MCG/ACT nasal spray Place 2 sprays into both nostrils daily. (Patient taking differently: Place 2 sprays into both nostrils daily as needed for allergies. ) 16  g 6   furosemide (LASIX) 20 MG tablet Take 1 tablet (20 mg total) by mouth daily. 30 tablet 0   glipiZIDE (GLUCOTROL) 5 MG tablet TAKE 1 TABLET BY MOUTH EACH MORNING AND 1/2 TABLET AT SUPPER (Patient taking differently: Take 2.5-5 mg by mouth See admin instructions. Take 5 mg  In the morning  2.5 mg in the evening) 45 tablet 5   glucose blood (ONE TOUCH ULTRA TEST) test strip USE TO TEST TWICE DAILY. DX;E11.9 100 each 11   latanoprost (XALATAN) 0.005 % ophthalmic solution Place 1 drop into both eyes at bedtime.      levothyroxine (SYNTHROID) 112 MCG tablet TAKE ONE TABLET BY MOUTH EVERY DAY BEFORE BREAKFAST 15 tablet 0   losartan (COZAAR) 25 MG tablet Take 1 tablet (25 mg total) by mouth daily. 30 tablet 0   Multiple Vitamins-Minerals (PRESERVISION AREDS 2+MULTI VIT PO) Take 1 capsule by mouth 2 (two) times daily.      pantoprazole (PROTONIX) 40 MG tablet Take 1 tablet (40 mg total) by mouth daily. 90 tablet 1   OXYGEN Inhale 2.5 L into the lungs daily as needed (Shortness of breath).      No facility-administered medications prior to visit.    Review of Systems  Constitutional: Negative for chills and fever.  Respiratory: Negative for cough and shortness of breath.   Skin: Negative for rash.     Objective:   Vitals:   08/04/19  1142  BP: 124/74  Pulse: 82  Temp: (!) 97.1 F (36.2 C)  TempSrc: Temporal  SpO2: 100%  Weight: 174 lb (78.9 kg)  Height: 5\' 4"  (1.626 m)   100% on  RA BMI Readings from Last 3 Encounters:  08/04/19 29.87 kg/m  07/22/19 32.99 kg/m  06/23/19 30.98 kg/m   Wt Readings from Last 3 Encounters:  08/04/19 174 lb (78.9 kg)  07/22/19 174 lb 9.6 oz (79.2 kg)  06/23/19 174 lb 14.4 oz (79.3 kg)    Physical Exam Vitals reviewed.  Constitutional:      Appearance: Normal appearance. She is not ill-appearing.     Comments: Frail-appearing  HENT:     Head: Normocephalic and atraumatic.  Eyes:     General: No scleral icterus. Cardiovascular:     Rate and Rhythm: Normal rate and regular rhythm.     Heart sounds: No murmur.  Pulmonary:     Comments: Breathing comfortably on room air, no conversational dyspnea.  Clear to auscultation bilaterally, minimally reduced right basilar breath sounds. Abdominal:     General: There is no distension.     Palpations: Abdomen is soft.  Musculoskeletal:        General: No swelling or deformity.     Cervical back: Neck supple.  Lymphadenopathy:     Cervical: No cervical adenopathy.  Skin:    General: Skin is warm and dry.     Comments: Mild bruising and very minimal erythema around catheter site, not along tract.  No warmth or drainage.  Neurological:     General: No focal deficit present.     Mental Status: She is alert.     Coordination: Coordination normal.     Comments: Globally weak, ambulates with a walker  Psychiatric:        Mood and Affect: Mood normal.        Behavior: Behavior normal.      CBC    Component Value Date/Time   WBC 7.5 06/10/2019 0520   RBC 4.23 06/10/2019 0520  HGB 12.7 06/10/2019 0520   HGB 12.8 05/03/2019 0947   HCT 39.3 06/10/2019 0520   HCT 38.0 05/03/2019 0947   HCT 36 01/22/2012 1316   PLT 175 06/10/2019 0520   PLT 188 05/03/2019 0947   MCV 92.9 06/10/2019 0520   MCV 89 05/03/2019 0947   MCH  30.0 06/10/2019 0520   MCHC 32.3 06/10/2019 0520   RDW 14.3 06/10/2019 0520   RDW 13.0 05/03/2019 0947   LYMPHSABS 1.8 06/09/2019 1110   LYMPHSABS 2.2 05/03/2019 0947   MONOABS 0.9 06/09/2019 1110   EOSABS 0.1 06/09/2019 1110   EOSABS 0.2 05/03/2019 0947   BASOSABS 0.0 06/09/2019 1110   BASOSABS 0.0 05/03/2019 0947    CHEMISTRY No results for input(s): NA, K, CL, CO2, GLUCOSE, BUN, CREATININE, CALCIUM, MG, PHOS in the last 168 hours. CrCl cannot be calculated (Patient's most recent lab result is older than the maximum 21 days allowed.).   Chest Imaging- films reviewed: CXR 07/28/2027-left-sided Pleurx catheter in the inferior hemithorax, persistent pleural effusion on the right.  Cardiomegaly.  Pulmonary Functions Testing Results: No flowsheet data found.     Assessment & Plan:     ICD-10-CM   1. Adenocarcinoma of right lung, stage 4 (HCC)  C34.91   2. Malignant pleural effusion  J91.0      MPE, stage IV lung adenocarcinoma -Pleur-X site check today.  Sutures removed.  -Discussed monitoring for signs of local infection, which should prompt a call for oral antibiotics. -Discussed that they should call us if the catheter is not draining.  Chest x-ray would need to be performed to determine if she needs lytics in her chest tube or if she no longer needs the Pleurx catheter due to pleurodesis. -Recommend draining at least once a week.  She should determine the frequency based on her symptoms. -Recommend that they call us if they have any issues with catheter.  RTC as previously scheduled on 4/12.   Current Outpatient Medications:    acetaminophen (TYLENOL) 500 MG tablet, Take 500 mg by mouth every 6 (six) hours as needed for moderate pain or headache., Disp: , Rfl:    albuterol (VENTOLIN HFA) 108 (90 Base) MCG/ACT inhaler, INHALE 2 PUFFS EVERY 4 HOURS AS NEEDED. (Patient taking differently: Inhale 2 puffs into the lungs every 4 (four) hours as needed for wheezing or  shortness of breath. ), Disp: 8.5 g, Rfl: 5   aspirin 81 MG tablet, Take 1 tablet (81 mg total) by mouth daily with breakfast. (Patient taking differently: Take 81 mg by mouth daily. ), Disp: 30 tablet, Rfl: 1   budesonide-formoterol (SYMBICORT) 160-4.5 MCG/ACT inhaler, USE 2 PUFFS TWICE DAILY FOR ASTHMA. RINSE MOUTH AFTER USE. (Patient taking differently: Inhale 2 puffs into the lungs daily. ), Disp: 10.2 g, Rfl: 5   fluticasone (FLONASE) 50 MCG/ACT nasal spray, Place 2 sprays into both nostrils daily. (Patient taking differently: Place 2 sprays into both nostrils daily as needed for allergies. ), Disp: 16 g, Rfl: 6   furosemide (LASIX) 20 MG tablet, Take 1 tablet (20 mg total) by mouth daily., Disp: 30 tablet, Rfl: 0   glipiZIDE (GLUCOTROL) 5 MG tablet, TAKE 1 TABLET BY MOUTH EACH MORNING AND 1/2 TABLET AT SUPPER (Patient taking differently: Take 2.5-5 mg by mouth See admin instructions. Take 5 mg  In the morning  2.5 mg in the evening), Disp: 45 tablet, Rfl: 5   glucose blood (ONE TOUCH ULTRA TEST) test strip, USE TO TEST TWICE DAILY. DX;E11.9, Disp: 100  each, Rfl: 11   latanoprost (XALATAN) 0.005 % ophthalmic solution, Place 1 drop into both eyes at bedtime. , Disp: , Rfl:    levothyroxine (SYNTHROID) 112 MCG tablet, TAKE ONE TABLET BY MOUTH EVERY DAY BEFORE BREAKFAST, Disp: 15 tablet, Rfl: 0   losartan (COZAAR) 25 MG tablet, Take 1 tablet (25 mg total) by mouth daily., Disp: 30 tablet, Rfl: 0   Multiple Vitamins-Minerals (PRESERVISION AREDS 2+MULTI VIT PO), Take 1 capsule by mouth 2 (two) times daily. , Disp: , Rfl:    pantoprazole (PROTONIX) 40 MG tablet, Take 1 tablet (40 mg total) by mouth daily., Disp: 90 tablet, Rfl: 1   OXYGEN, Inhale 2.5 L into the lungs daily as needed (Shortness of breath). , Disp: , Rfl:      Julian Hy, DO [facility] 08/04/2019 12:54 PM

## 2019-08-08 ENCOUNTER — Ambulatory Visit (INDEPENDENT_AMBULATORY_CARE_PROVIDER_SITE_OTHER): Payer: PPO | Admitting: Family Medicine

## 2019-08-08 ENCOUNTER — Ambulatory Visit: Payer: PPO | Admitting: Family Medicine

## 2019-08-08 ENCOUNTER — Other Ambulatory Visit: Payer: Self-pay

## 2019-08-08 DIAGNOSIS — R600 Localized edema: Secondary | ICD-10-CM | POA: Diagnosis not present

## 2019-08-08 MED ORDER — DICLOFENAC SODIUM 1 % EX GEL: 2.0000 g | Freq: Four times a day (QID) | CUTANEOUS | 3 refills | Status: AC

## 2019-08-08 NOTE — Progress Notes (Signed)
   Subjective:    Patient ID: Victoria Vasquez, female    DOB: 09/04/1927, 86 y.o.   MRN: [id number]  HPIbilateral swelling in feet and ankles for about 3 days now. Taking lasix 20mg  one daily. Started out with pain in both heels and daughter would soak them in epsom salt.  She has terminal cancer.  Patient having some shortness of breath but actually doing much better since her catheter was placed denies any fever chills sweats not getting around as good as she was eating okay not in any pain or discomfort Virtual Visit via Video Note  I connected with Victoria Vasquez on 08/08/19 at  1:10 PM EDT by a video enabled telemedicine application and verified that I am speaking with the correct person using two identifiers.  Location: Patient: home Provider: office   I discussed the limitations of evaluation and management by telemedicine and the availability of in person appointments. The patient expressed understanding and agreed to proceed.  History of Present Illness:    Observations/Objective:   Assessment and Plan:   Follow Up Instructions:    I discussed the assessment and treatment plan with the patient. The patient was provided an opportunity to ask questions and all were answered. The patient agreed with the plan and demonstrated an understanding of the instructions.   The patient was advised to call back or seek an in-person evaluation if the symptoms worsen or if the condition fails to improve as anticipated.  I provided 20 minutes of non-face-to-face time during this encounter.      Review of Systems Patient denies any chest tightness pressure pain shortness breath fever chills sweats    Objective:   Physical Exam To visit unable to do       Assessment & Plan:  Metastatic cancer Terminal Under hospice care Having some pedal edema using diuretic as needed Also has ankle pain and foot pain bilateral intermittent unable to take large amounts of NSAIDs because of  her kidney function recommend Voltaren gel apply 2 g 4 times daily as needed hopefully this is covered by hospice Patient has follow-up with pulmonary in April and endocrinology in April we will see her the first week of May if she is having further trouble she can follow-up sooner also if patient unable to come to the office we can do a home visit  Pedal edema is also probably related to low protein but given her hospice status we will not put her through a lot of testing

## 2019-08-12 ENCOUNTER — Telehealth: Payer: Self-pay | Admitting: Family Medicine

## 2019-08-12 NOTE — Telephone Encounter (Signed)
Denial in Dr Nicki Reaper basket- her insurance denied coverage of some medications given to her while inpatient in hospital

## 2019-08-12 NOTE — Telephone Encounter (Signed)
Pt's daughter brought in a denial of service from insurance for mom's recent hospital stay, states they mentioned this to Dr. Nicki Reaper at recent visit & he said to bring it here & we'd work on it  Please see copy in nurse's station prior [place] backet

## 2019-08-22 NOTE — Telephone Encounter (Signed)
This particular family related that their loved one was in the hospital.  They are a member of [employer]. They had some bills that they stated were denied.  They brought this to my attention.  I stated to them that this was something outside of my control but we would try to help point them in the right direction about what to expect or what to do.  Please touch base with daughter and direct her to the right department etc. Thank you-Dr. Nicki Reaper

## 2019-08-23 ENCOUNTER — Telehealth: Payer: Self-pay | Admitting: Internal Medicine

## 2019-08-23 NOTE — Telephone Encounter (Signed)
I'm sorry that happened. I think the reason that happened was probably that her lung does not want to expand fully, and when the fluid is out and there is a lot of pressure on the lung to re-expand it can cause pain. Stuff floating in the fluid is usually fine- just protein that deposits in the fluid. I don't worry about things floating in the fluid as long as overall the fluid looks about the same. If it gets bloody or gets really cloudy if it wasn't before I would worry more, but I think the pain is re-expansion pain. To prevent that try to drain more slowly at the end and stop draining when she starts to notice discomfort.  Let me know if there are other questions.  LPC

## 2019-08-23 NOTE — Telephone Encounter (Signed)
Spoke with patient's daughter [name]. She stated that she drained the catheter on Saturday night (08/20/19) and the fluid appeared to have some substance floating in it. She described the substance as light in color and resembled skin flakes. She stated that the patient screamed out in pain, which something she has not done before. Afterwards, the patient became very cold and her ankles and feet were swollen.   She stated that the incision site looks great, denied any signs of redness or pus. She is back to her normal self today. Denied any fevers or additional body aches. She does have a hospice nurse and she plans on having the hospice there on the days she needs to drain the catheter.   She wants to know if she needs to be concerned or if she needs an antibiotic.   Dr. Carlis Abbott, can you please advise since Dr. Shearon Stalls is working at the hospital today? Thank you!

## 2019-08-23 NOTE — Telephone Encounter (Signed)
Spoke with [name], she verbalized understanding. Nothing further needed at time of call.

## 2019-08-23 NOTE — Telephone Encounter (Signed)
Spoke with [name] again. She verbalized understanding about the re-expansion pain. When I advised her about the color change, she stated that it has always been a reddish, orange color. She wants to know if that color is normal. I advised her that I would check with Dr. Carlis Abbott again.   Dr. Carlis Abbott, please advise. Thanks!

## 2019-08-23 NOTE — Telephone Encounter (Signed)
Fluid color can vary for everyone, so usually a change is more significant than what color it is. Often when people have orange or reddish fluid it will clear over time to be more yellow-ish, but not always.

## 2019-08-29 ENCOUNTER — Other Ambulatory Visit: Payer: Self-pay

## 2019-08-29 ENCOUNTER — Ambulatory Visit (INDEPENDENT_AMBULATORY_CARE_PROVIDER_SITE_OTHER): Admitting: Internal Medicine

## 2019-08-29 ENCOUNTER — Encounter: Payer: Self-pay | Admitting: Internal Medicine

## 2019-08-29 VITALS — BP 130/80 | HR 88 | Temp 97.4°F | Ht 64.0 in | Wt 175.2 lb

## 2019-08-29 DIAGNOSIS — J910 Malignant pleural effusion: Secondary | ICD-10-CM

## 2019-08-29 NOTE — Patient Instructions (Signed)
The patient should have follow up scheduled with APP in 3 months.   Continue pain control prior to plerux drainage. Continue draining once/week, more if tolerated.

## 2019-08-29 NOTE — Progress Notes (Signed)
Victoria Vasquez    [id number]    1928/01/16  Primary Care Physician:Luking, Elayne Snare, MD Date of Appointment: 08/29/2019 Established Patient Visit  Chief complaint:   Chief Complaint  Patient presents with  . Follow-up    Malignant pulmonary effusion.  Swelling in feet and ankles.  Not needed oxygen since tube placed in chest.  Draining weekly.  500-700cc Hospice nurse out weekly.  Hurts when tube drains at end.     HPI: Victoria Vasquez is a 86 y.o. woman with stage IV adenocarcinama of the lung who had pleurx catheter placed. Dr. Carlis Abbott saw in follow up for stitch removal.   Interval Updates: Having significant pain with drainage so only draining once/week. Recently started on pain medication to be given 30 minutes prior to drainage. Occasional chest pain which is reproducible and lasts for seconds. No wheezing or cough.  Asthma Control Test ACT Total Score  08/29/2019 24      I have reviewed the patient's family social and past medical history and updated as appropriate.   Past Medical History:  Diagnosis Date  . Arthritis   . Asthma   . Cancer ([place])    BREAST CANCER LEFT BREAST  . Chest pain    Evaluation prior to 2003 reportedly including cath-records never located; Echo in 2009: Mild LVH, LAE2, nl EF, moderate LAE, MR1-2  . Chronic kidney disease   . COPD (chronic obstructive pulmonary disease) (HCC)    Exertional dyspnea; PFTs in 2002: Moderate small airway obstruction, mildly reduced lung volumes and DLCO, nl ABG  . Diabetes mellitus, type II ([place])   . Dyspnea   . Family history of adverse reaction to anesthesia    daugher has postop N&V  . Fatigue   . GERD (gastroesophageal reflux disease)   . Hyperlipidemia   . Hypertension   . Legally blind 1 1 2015  . Macular degeneration of both eyes 1 1 2015  . Parotid mass 2003   Right-2003  . Scoliosis   . Shingles     Past Surgical History:  Procedure Laterality Date  . APPENDECTOMY  1951  . BREAST  BIOPSY Left 10/05/2017   Procedure: BREAST BIOPSY;  Surgeon: Aviva Signs, MD;  Location: [facility];  Service: General;  Laterality: Left;  . BREAST LUMPECTOMY Left 03/18/2018  . BREAST LUMPECTOMY Left 03/18/2018   Procedure: LEFT BREAST CENTRAL  LUMPECTOMY;  Surgeon: Rolm Bookbinder, MD;  Location: [facility];  Service: General;  Laterality: Left;  . CARDIAC CATHETERIZATION  1999  . CHEST TUBE INSERTION Bilateral 07/28/2019   Procedure: INSERTION PLEURAL DRAINAGE CATHETER;  Surgeon: Spero Geralds, MD;  Location: [facility] ENDOSCOPY;  Service: Pulmonary;  Laterality: Bilateral;  . CHOLECYSTECTOMY  2003  . COLONOSCOPY  04/16/2006   [place]-Normal colon.  Exam limited due to retained particulate matter in the lumen of the colon, polyps less than 1 cm would have been easily missed.  Otherwise, no polyps, masses, inflammatory changes or vascular ectasia seen/ Normal retroflexed view of the rectum..  recommend repeat exam at 03/2011  . ORIF  1996  . ORIF ANKLE FRACTURE  1992  . THYROIDECTOMY N/A 11/03/2018   Procedure: TOTAL THYROIDECTOMY;  Surgeon: Michael Boston, MD;  Location: [facility];  Service: General;  Laterality: N/A;    Family History  Problem Relation Age of Onset  . Stomach cancer Mother   . Alzheimer's disease Sister   . Colon cancer Neg Hx     Social  History   Occupational History  . Occupation: Retired  Tobacco Use  . Smoking status: Passive Smoke Exposure - Never Smoker  . Smokeless tobacco: Never Used  Substance and Sexual Activity  . Alcohol use: No  . Drug use: No  . Sexual activity: Not on file    Review of systems: Constitutional: No fevers, chills, night sweats, or weight loss. CV: No chest pain, or palpitations. Resp: No hemoptysis.  Physical Exam: Blood pressure 130/80, pulse 88, temperature (!) 97.4 F (36.3 C), temperature source Temporal, height 5\' 4"  (1.626 m), weight 175 lb 3.2 oz (79.5 kg), SpO2 94 %.  Gen:      No acute distress Lungs:    Kyphosis. On room  air.No increased respiratory effort, symmetric chest wall excursion, clear to auscultation bilaterally, no wheezes or crackles. Right sided pleurx catheter site is CDI.  CV:         Regular rate and rhythm; no murmurs, rubs, or gallops.  No pedal edema   Data Reviewed: Imaging: I have personally reviewed the chest xray from march 11 which shows adequate positioning of the catheter with improvement of effusion.   Labs:  Immunization status: Immunization History  Administered Date(s) Administered  . Influenza Split 03/07/2013  . Influenza,inj,Quad PF,6+ Mos 03/27/2014, 03/27/2015, 01/24/2016, 02/12/2017, 02/16/2018, 02/25/2019  . Moderna SARS-COVID-2 Vaccination 05/31/2019, 06/30/2019  . Pneumococcal Conjugate-13 05/18/2014  . Pneumococcal Polysaccharide-23 01/24/2016  . Zoster Recombinat (Shingrix) 06/23/2018, 01/28/2019    Assessment:  Malignant pleural effusion, right side Adenocarcinoma, stage IV of the lung primary Mild intermittent asthma well controlled  Plan/Recommendations: Continue Pleurx catheter drainage weekly.  Pain control before hand may help with increasing fluid removal.  Draining slowly will also help with pain management.  We discussed hydrating more frequently when the fluid is greater than 300 cc per drainage and help improve the chance for auto pleurodesis.  However her symptoms are currently well controlled and she would rather drain less frequently due to discomfort.  Fine to continue once weekly for now.  We discussed return to care precautions including failure of the drainage catheter to drain, worsening pain redness or pus at the site of the catheter, pain out of proportion despite slowing down the catheter drainage.  Continue as needed bronchodilators for asthma.   She is under hospice care for her adenocarcinoma.  Return to Care: Return in about 3 months (around 11/28/2019). Follow-up with an APP.  Lenice Llamas, MD Pulmonary and [place]

## 2019-09-16 ENCOUNTER — Ambulatory Visit: Payer: PPO | Admitting: "Endocrinology

## 2019-09-19 ENCOUNTER — Ambulatory Visit: Admitting: Family Medicine

## 2019-09-26 ENCOUNTER — Telehealth: Payer: Self-pay | Admitting: *Deleted

## 2019-09-26 NOTE — Telephone Encounter (Signed)
Received a call from [place] at [facility], stating pt daughter observed redness underneath pt breast. Asking for nystatin powder to treat area. Per Dr.Gudena, okay to order Nystatin powder. Called Estill Bamberg at hospice to give okay.

## 2019-09-27 ENCOUNTER — Other Ambulatory Visit: Payer: Self-pay

## 2019-09-27 ENCOUNTER — Ambulatory Visit (INDEPENDENT_AMBULATORY_CARE_PROVIDER_SITE_OTHER): Payer: PPO | Admitting: Family Medicine

## 2019-09-27 DIAGNOSIS — B372 Candidiasis of skin and nail: Secondary | ICD-10-CM

## 2019-09-27 MED ORDER — FLUCONAZOLE 200 MG PO TABS: 200.0000 mg | ORAL_TABLET | Freq: Every day | ORAL | 0 refills | Status: AC

## 2019-09-27 NOTE — Progress Notes (Signed)
   Subjective:    Patient ID: Rosine Beat, female    DOB: January 04, 1928, 86 y.o.   MRN: [id number]  HPI Patient daughter brings her in today with complaints of rash under breasts. Patient reports itching and foul odor.  Significant rash underneath the breasts.  Relates a lot of itching burning patient has metastatic cancer  Review of Systems     Objective:   Physical Exam  Lungs clear respiratory rate normal heart regular Patient has rash underneath the breast along with seborrheic keratosis consistent with yeast    No sign of bacterial component no need for antibiotics Assessment & Plan:  Significant yeast related issues recommend antifungal powder and nystatin that hospice sent her also recommend going forward with Diflucan 200 mg daily for the next 7 days

## 2019-10-12 ENCOUNTER — Other Ambulatory Visit: Payer: Self-pay

## 2019-10-12 ENCOUNTER — Encounter: Payer: Self-pay | Admitting: Internal Medicine

## 2019-10-12 ENCOUNTER — Ambulatory Visit: Payer: Medicare Other | Admitting: Internal Medicine

## 2019-10-12 VITALS — BP 120/62 | HR 80 | Temp 98.2°F | Ht 64.0 in | Wt 171.2 lb

## 2019-10-12 DIAGNOSIS — J910 Malignant pleural effusion: Secondary | ICD-10-CM

## 2019-10-12 MED ORDER — KETOCONAZOLE 2 % EX CREA: TOPICAL_CREAM | CUTANEOUS | 1 refills | Status: DC

## 2019-10-12 NOTE — Patient Instructions (Signed)
The patient should have follow up scheduled with APP in 3 months.

## 2019-10-12 NOTE — Progress Notes (Signed)
Victoria Vasquez    [id number]    09-01-1927  Primary Care Physician:Luking, Elayne Snare, MD Date of Appointment: 10/12/2019 Established Patient Visit  Chief complaint:   Chief Complaint  Patient presents with  . Follow-up    Yeast rash under breast leading to [place] site with drainage      HPI: Victoria Vasquez is a 86 y.o. woman with stage IV adenocarcinama of the lung who had pleurx catheter placed.  Drainage has been less frequent only once/week due to pain.  Interval Updates: Still complaining of chest pain. Draining about 700-800cc once/week. Taking hydrocodone before hand. Hospice nurse still coming in. Had yeast infection under breasts which improved with fluconazole, but is still present. Having trouble keeping the area dry.    I have reviewed the patient's family social and past medical history and updated as appropriate.   Past Medical History:  Diagnosis Date  . Arthritis   . Asthma   . Cancer ([place])    BREAST CANCER LEFT BREAST  . Chest pain    Evaluation prior to 2003 reportedly including cath-records never located; Echo in 2009: Mild LVH, LAE2, nl EF, moderate LAE, MR1-2  . Chronic kidney disease   . COPD (chronic obstructive pulmonary disease) (HCC)    Exertional dyspnea; PFTs in 2002: Moderate small airway obstruction, mildly reduced lung volumes and DLCO, nl ABG  . Diabetes mellitus, type II ([place])   . Dyspnea   . Family history of adverse reaction to anesthesia    daugher has postop N&V  . Fatigue   . GERD (gastroesophageal reflux disease)   . Hyperlipidemia   . Hypertension   . Legally blind 1 1 2015  . Macular degeneration of both eyes 1 1 2015  . Parotid mass 2003   Right-2003  . Scoliosis   . Shingles     Past Surgical History:  Procedure Laterality Date  . APPENDECTOMY  1951  . BREAST BIOPSY Left 10/05/2017   Procedure: BREAST BIOPSY;  Surgeon: Aviva Signs, MD;  Location: [facility];  Service: General;  Laterality: Left;  . BREAST  LUMPECTOMY Left 03/18/2018  . BREAST LUMPECTOMY Left 03/18/2018   Procedure: LEFT BREAST CENTRAL  LUMPECTOMY;  Surgeon: Rolm Bookbinder, MD;  Location: [place];  Service: General;  Laterality: Left;  . CARDIAC CATHETERIZATION  1999  . CHEST TUBE INSERTION Bilateral 07/28/2019   Procedure: INSERTION PLEURAL DRAINAGE CATHETER;  Surgeon: Spero Geralds, MD;  Location: [facility] ENDOSCOPY;  Service: Pulmonary;  Laterality: Bilateral;  . CHOLECYSTECTOMY  2003  . COLONOSCOPY  04/16/2006   [place]-Normal colon.  Exam limited due to retained particulate matter in the lumen of the colon, polyps less than 1 cm would have been easily missed.  Otherwise, no polyps, masses, inflammatory changes or vascular ectasia seen/ Normal retroflexed view of the rectum..  recommend repeat exam at 03/2011  . ORIF  1996  . ORIF ANKLE FRACTURE  1992  . THYROIDECTOMY N/A 11/03/2018   Procedure: TOTAL THYROIDECTOMY;  Surgeon: Michael Boston, MD;  [facility];  Service: General;  Laterality: N/A;    Family History  Problem Relation Age of Onset  . Stomach cancer Mother   . Alzheimer's disease Sister   . Colon cancer Neg Hx     Social History   Occupational History  . Occupation: Retired  Tobacco Use  . Smoking status: Passive Smoke Exposure - Never Smoker  . Smokeless tobacco: Never Used  Substance and Sexual Activity  .  Alcohol use: No  . Drug use: No  . Sexual activity: Not on file   Physical Exam: Blood pressure 120/62, pulse 80, temperature 98.2 F (36.8 C), temperature source Oral, height 5\' 4"  (1.626 m), weight 171 lb 3.2 oz (77.7 kg), SpO2 98 %.  Gen:      No acute distress Lungs:    Kyphosis. On room air.No increased respiratory effort, symmetric chest wall excursion, clear to auscultation bilaterally, no wheezes or crackles. Right sided pleurx catheter site has some erythema which extends underneath both breasts, is moist and smells like yeast.  CV:         Regular rate and rhythm; no murmurs, rubs, or  gallops.  No pedal edema Skin:      Yeast infection noted  Data Reviewed: Imaging: I have personally reviewed the chest xray from march 11 which shows adequate positioning of the catheter with improvement of effusion.   Immunization status: Immunization History  Administered Date(s) Administered  . Influenza Split 03/07/2013  . Influenza,inj,Quad PF,6+ Mos 03/27/2014, 03/27/2015, 01/24/2016, 02/12/2017, 02/16/2018, 02/25/2019  . Moderna SARS-COVID-2 Vaccination 05/31/2019, 06/30/2019  . Pneumococcal Conjugate-13 05/18/2014  . Pneumococcal Polysaccharide-23 01/24/2016  . Zoster Recombinat (Shingrix) 06/23/2018, 01/28/2019    Assessment:  Malignant pleural effusion, right side Adenocarcinoma, stage IV of the lung primary Mild intermittent asthma well controlled Intertrigo  Plan/Recommendations: Continue Pleurx catheter drainage weekly. I doubt she will achieve autopleurodesis based on frequency of drainage. We discussed return to care precautions including failure of the drainage catheter to drain, worsening pain redness or pus at the site of the catheter, pain out of proportion despite slowing down the catheter drainage.  For her intertrigo will trial ketoconazole cream to the affected area as well as keeping things dry.  If this doesn't help I recommend she follow up with PCP for further recommendations.   Continue as needed bronchodilators for asthma.   She is under hospice care for her adenocarcinoma.  Return to Care: Return in about 3 months (around 01/12/2020).  Lenice Llamas, MD Pulmonary and [place]

## 2019-10-20 ENCOUNTER — Ambulatory Visit: Payer: Medicare Other | Admitting: Primary Care

## 2019-11-22 ENCOUNTER — Other Ambulatory Visit: Payer: Self-pay | Admitting: *Deleted

## 2019-11-22 ENCOUNTER — Telehealth: Payer: Self-pay | Admitting: Family Medicine

## 2019-11-22 MED ORDER — AMOXICILLIN 500 MG PO CAPS: 500.0000 mg | ORAL_CAPSULE | Freq: Three times a day (TID) | ORAL | 0 refills | Status: AC

## 2019-11-22 NOTE — Telephone Encounter (Signed)
Cheratussin is 100mg  guaifenesin, 10 mg codeine and 3 % alcohol- can take 1-2 teaspoon every 4-6 hours

## 2019-11-22 NOTE — Telephone Encounter (Signed)
Amoxil Amoxil 500 mg 1 taken 3 times daily for 7 days follow-up if ongoing

## 2019-11-22 NOTE — Telephone Encounter (Signed)
I am more familiar with Hycodan but please find out from the family do they want a cough medicine with get a controlled substance or do they want to just stick with OTC Robitussin-DM I am not opposed to Hycodan but it can cause drowsiness at her age

## 2019-11-22 NOTE — Telephone Encounter (Signed)
Cindy notified and Amoxil sent to [facility]. Ok to send scrip for Cheratussin?

## 2019-11-22 NOTE — Telephone Encounter (Signed)
Cathy with Hospice is with Ms. Bouldin she is having congestion, possible bronchitis wants to know if a antibiotic can be called in and Cheratussin at [facility] will pay for   [facility] call back [phone] [facility])

## 2019-11-22 NOTE — Telephone Encounter (Signed)
Cathy at [facility] states Cheratussin is something [facility] makes and it is Robitussin with Codeine in it -helps to relax you

## 2019-11-22 NOTE — Telephone Encounter (Signed)
Sure Not certain if they have a specific quantity or direction but we can follow their protocol

## 2019-11-22 NOTE — Telephone Encounter (Signed)
Not quite sure of the following- Do they need a actual prescription?  If so please find out from [facility] is this a 12-hour formulation for 6 hours? If it is just more so they need my approval then they may have the approval with caution of drowsiness

## 2019-11-23 NOTE — Telephone Encounter (Signed)
Patient states she already has some cough meds she is taking and will let us know if there is anything else she needs.

## 2019-11-25 ENCOUNTER — Telehealth: Payer: Self-pay | Admitting: Family Medicine

## 2019-11-25 MED ORDER — NYSTATIN 100000 UNIT/ML MT SUSP: 5.0000 mL | Freq: Four times a day (QID) | OROMUCOSAL | 0 refills | Status: AC

## 2019-11-25 NOTE — Telephone Encounter (Signed)
Prescription sent electronically to [facility]. Amanda at [facility] notified.

## 2019-11-25 NOTE — Telephone Encounter (Signed)
Victoria Vasquez with Hospice calling to see if Dr. Nicki Reaper thought a low dose steriod could help pt. She is having inflammation in her neck and throat it started yesterday.   [facility].   Victoria Vasquez CB# [phone].

## 2019-11-25 NOTE — Telephone Encounter (Signed)
Contacted Victoria Vasquez at [facility]. Victoria Vasquez states that she looked in pt throat and could see some inflammation. Pt is having trouble swallowing. Victoria Vasquez can feel neck inflammation on the outside. All vitals were good, no discoloration in throat. Started yesterday. Please advise. Thank you

## 2019-11-25 NOTE — Telephone Encounter (Signed)
Nurses I am actually more concerned about the possibility of the inflammation being initial thrush I would recommend trying nystatin oral solution, 1 teaspoon swish /gargle and swallow 4 times daily for the next 7 days Please send in prescription (As for the prednisone I am concerned that that could cause actually worsening issue hold off on this currently)

## 2020-01-09 ENCOUNTER — Telehealth: Payer: Self-pay | Admitting: Internal Medicine

## 2020-01-09 NOTE — Telephone Encounter (Signed)
Primary Pulmonologist: Shearon Stalls Last office visit and with whom: 10/11/19, Shearon Stalls What do we see them for (pulmonary problems): Malignant pleural effussion Last OV assessment/plan:   Assessment:  Malignant pleural effusion, right side Adenocarcinoma, stage IV of the lung primary Mild intermittent asthma well controlled Intertrigo  Plan/Recommendations: Continue Pleurx catheter drainage weekly. I doubt she will achieve autopleurodesis based on frequency of drainage. We discussed return to care precautions including failure of the drainage catheter to drain, worsening pain redness or pus at the site of the catheter, pain out of proportion despite slowing down the catheter drainage.  For her intertrigo will trial ketoconazole cream to the affected area as well as keeping things dry.  If this doesn't help I recommend she follow up with PCP for further recommendations.   Continue as needed bronchodilators for asthma.   She is under hospice care for her adenocarcinoma.  Return to Care: Return in about 3 months (around 01/12/2020).  Lenice Llamas, MD Pulmonary and [place]         Patient Instructions by Spero Geralds, MD at 10/12/2019 11:30 AM Author: Spero Geralds, MD Author Type: Physician Filed: 10/12/2019 12:25 PM  Note Status: Signed Cosign: Cosign Not Required Encounter Date: 10/12/2019  Editor: Spero Geralds, MD (Physician)               The patient should have follow up scheduled with APP in 3 months.        Was appointment offered to patient (explain)?  No, awaiting recommendations from app   Reason for call: 750 ml bloody drainage from pleurx catheter on 8/20.  She had 300 ml on Sunday, the flow just stopped.  She is complaining of pain, taking hydrocodone for the pain.  Patient using oxygen more often, more sob.  Denies any cough or fever.  States she has chills and says she is very cold.  Complains of a  clogging in her throat, mucus.  Antihistamine has not helped.   (examples of things to ask: : When did symptoms start? 8/20 Fever?no  Cough? No Productive? Na Color to sputum? Na More sputum than usual? Na Wheezing? Na Have you needed increased oxygen? Using 2-3 liters, more often, had not been using oxygen on a regular basis.  Are you taking your respiratory medications? Yes.  What over the counter measures have you tried?) none  Allergies  Allergen Reactions  . Lisinopril Cough  . Statins Other (See Comments)    Myalgias    Immunization History  Administered Date(s) Administered  . Influenza Split 03/07/2013  . Influenza,inj,Quad PF,6+ Mos 03/27/2014, 03/27/2015, 01/24/2016, 02/12/2017, 02/16/2018, 02/25/2019  . Moderna SARS-COVID-2 Vaccination 05/31/2019, 06/30/2019  . Pneumococcal Conjugate-13 05/18/2014  . Pneumococcal Polysaccharide-23 01/24/2016  . Zoster Recombinat (Shingrix) 06/23/2018, 01/28/2019

## 2020-01-09 NOTE — Telephone Encounter (Signed)
Called and spoke with patient's daughter, Donne Anon, listed on DPR.  Relayed message from Wyn Quaker NP to present to the ED for evaluation of the pleurx.  Patient has an upcoming appointment with Dr. Shearon Stalls on 01/30/20, advised daughter to keep that appointment.  Nothing further needed.

## 2020-01-09 NOTE — Telephone Encounter (Signed)
01/09/2020  Unfortunately is not something that we can manage telephonically.  Would recommend the patient present to the emergency room for further evaluation of her Pleurx.  Would also recommend pulmonary consult so they can evaluate the Pleurx drain.  We will also route note to Dr. Shearon Stalls as Juluis Rainier.  Wyn Quaker, FNP

## 2020-01-30 ENCOUNTER — Encounter: Payer: Self-pay | Admitting: Internal Medicine

## 2020-01-30 ENCOUNTER — Ambulatory Visit (INDEPENDENT_AMBULATORY_CARE_PROVIDER_SITE_OTHER): Admitting: Internal Medicine

## 2020-01-30 ENCOUNTER — Other Ambulatory Visit: Payer: Self-pay

## 2020-01-30 VITALS — BP 118/66 | HR 84 | Temp 98.0°F | Ht 64.0 in | Wt 164.2 lb

## 2020-01-30 DIAGNOSIS — J910 Malignant pleural effusion: Secondary | ICD-10-CM | POA: Diagnosis not present

## 2020-01-30 NOTE — Progress Notes (Signed)
Victoria Vasquez    [id number]    12-05-1927  Primary Care Physician:Luking, Elayne Snare, MD Date of Appointment: 01/30/2020 Established Patient Visit  Chief complaint:   Chief Complaint  Patient presents with   Follow-up    Patient is getting less fluid from Pleurx. Drainging every 5 or 6 days. Yesterday you got 500 from it. Patient feels about the the same     HPI: Victoria Vasquez is a 86 y.o. woman with stage IV adenocarcinama of the lung who had pleurx catheter placed.  Drainage has been less frequent only once/week due to pain.  Interval Updates: Draining every 5-6 days somewhere between 500-700cc. Had one episode of blood drainage a few weeks ago. Feeling weak and a little sad, but daughter says she is eating fairly well, not losing weight. Hospice nurse coming once/week.   I have reviewed the patient's family social and past medical history and updated as appropriate.   Past Medical History:  Diagnosis Date   Arthritis    Asthma    Cancer ([place])    BREAST CANCER LEFT BREAST   Chest pain    Evaluation prior to 2003 reportedly including cath-records never located; Echo in 2009: Mild LVH, LAE2, nl EF, moderate LAE, MR1-2   Chronic kidney disease    COPD (chronic obstructive pulmonary disease) (HCC)    Exertional dyspnea; PFTs in 2002: Moderate small airway obstruction, mildly reduced lung volumes and DLCO, nl ABG   Diabetes mellitus, type II ([place])    Dyspnea    Family history of adverse reaction to anesthesia    daugher has postop N&V   Fatigue    GERD (gastroesophageal reflux disease)    Hyperlipidemia    Hypertension    Legally blind 1 1 2015   Macular degeneration of both eyes 1 1 2015   Parotid mass 2003   Right-2003   Scoliosis    Shingles     Past Surgical History:  Procedure Laterality Date   APPENDECTOMY  1951   BREAST BIOPSY Left 10/05/2017   Procedure: BREAST BIOPSY;  Surgeon: Aviva Signs, MD;  Location: [facility];  Service:  General;  Laterality: Left;   BREAST LUMPECTOMY Left 03/18/2018   BREAST LUMPECTOMY Left 03/18/2018   Procedure: LEFT BREAST CENTRAL  LUMPECTOMY;  Surgeon: Rolm Bookbinder, MD;  Location: [place];  Service: General;  Laterality: Left;   [place] Bilateral 07/28/2019   Procedure: INSERTION PLEURAL DRAINAGE CATHETER;  Surgeon: Spero Geralds, MD;  Location: [facility] ENDOSCOPY;  Service: Pulmonary;  Laterality: Bilateral;   CHOLECYSTECTOMY  2003   COLONOSCOPY  04/16/2006   [place]-Normal colon.  Exam limited due to retained particulate matter in the lumen of the colon, polyps less than 1 cm would have been easily missed.  Otherwise, no polyps, masses, inflammatory changes or vascular ectasia seen/ Normal retroflexed view of the rectum..  recommend repeat exam at 03/2011   ORIF  1996   ORIF [place] N/A 11/03/2018   Procedure: TOTAL THYROIDECTOMY;  Surgeon: Michael Boston, MD;  Location: [facility];  Service: General;  Laterality: N/A;    Family History  Problem Relation Age of Onset   Stomach cancer Mother    Alzheimer's disease Sister    Colon cancer Neg Hx     Social History   Occupational History   Occupation: Retired  Tobacco Use   Smoking status: Passive Smoke Exposure -  Never Smoker   Smokeless tobacco: Never Used  Vaping Use   Vaping Use: Never used  Substance and Sexual Activity   Alcohol use: No   Drug use: No   Sexual activity: Not on file   Physical Exam: Blood pressure 118/66, pulse 84, temperature 98 F (36.7 C), temperature source Temporal, height 5\' 4"  (1.626 m), weight 164 lb 3.2 oz (74.5 kg), SpO2 92 %.  Gen:      No acute distress Lungs:    Kyphosis. On room air.No increased respiratory effort, symmetric chest wall excursion, clear to auscultation bilaterally, no wheezes or crackles.  CV:         Regular rate and rhythm; no murmurs, rubs, or gallops.  No pedal edema Psych: flat  affect  Data Reviewed: Imaging: I have personally reviewed the chest xray from march 11 which shows adequate positioning of the catheter with improvement of effusion.   Immunization status: Immunization History  Administered Date(s) Administered   Influenza Split 03/07/2013   Influenza,inj,Quad PF,6+ Mos 03/27/2014, 03/27/2015, 01/24/2016, 02/12/2017, 02/16/2018, 02/25/2019   Moderna SARS-COVID-2 Vaccination 05/31/2019, 06/30/2019   Pneumococcal Conjugate-13 05/18/2014   Pneumococcal Polysaccharide-23 01/24/2016   Zoster Recombinat (Shingrix) 06/23/2018, 01/28/2019    Assessment:  Malignant pleural effusion, right side Adenocarcinoma, stage IV of the lung primary Mild intermittent asthma well controlled  Plan/Recommendations:  We discussed return to care precautions including failure of the drainage catheter to drain, worsening pain redness or pus at the site of the catheter, pain out of proportion despite slowing down the catheter drainage. She should call if the drainage is close to 50cc/session. She is going to increase draining to three times/week to try to see if she achieves autopleurodesis.   For her intertrigo will trial ketoconazole cream to the affected area as well as keeping things dry.  If this doesn't help I recommend she follow up with PCP for further recommendations.   Continue as needed bronchodilators for asthma.   She is under hospice care for her adenocarcinoma.  Return to Care: Return in about 3 months (around 04/30/2020).  Lenice Llamas, MD Pulmonary and [place]

## 2020-01-30 NOTE — Patient Instructions (Addendum)
The patient should have follow up scheduled with myself in 3 months.   Drain Pleurx 3 times a week. Please call me if the drainage drops close to 50 ml per drainage. We can discuss removal at that time.   Notification of test results are managed in the following manner: If there are  any recommendations or changes to the  plan of care discussed in office today,  we will contact you and let you know what they are. If you do not hear from [place], then your results are normal and you can view them through your  MyChart account , or a letter will be sent to you. Thank you again for trusting [place] with your care, Spero Geralds ,[facility] [facility].

## 2020-02-02 ENCOUNTER — Other Ambulatory Visit: Payer: Self-pay | Admitting: Hematology and Oncology

## 2020-02-02 NOTE — Telephone Encounter (Signed)
These medications were not originally prescribed by you - Pt is on hospice, will not allow me to refuse.

## 2020-02-20 ENCOUNTER — Other Ambulatory Visit: Payer: Self-pay | Admitting: Hematology and Oncology

## 2020-02-20 NOTE — Telephone Encounter (Signed)
Please refill if appropriate.     Thank you

## 2020-03-08 ENCOUNTER — Other Ambulatory Visit: Payer: Self-pay | Admitting: Hematology and Oncology

## 2020-03-09 NOTE — Telephone Encounter (Signed)
Dr. Lindi Adie, For your approval or refusal. Gardiner Rhyme, RN

## 2020-04-18 ENCOUNTER — Other Ambulatory Visit: Payer: Self-pay | Admitting: Hematology and Oncology

## 2020-04-18 ENCOUNTER — Other Ambulatory Visit: Payer: Self-pay

## 2020-04-18 NOTE — Telephone Encounter (Signed)
Cassandria Santee, RN with hospice called requesting refill on Norco be sent to pharmacy on patient's behalf.    Will send for MD approval.

## 2020-04-18 NOTE — Telephone Encounter (Signed)
Hospice patient - Last Rx 10/25

## 2020-04-19 ENCOUNTER — Other Ambulatory Visit: Payer: Self-pay | Admitting: Hematology and Oncology

## 2020-04-19 MED ORDER — HYDROCODONE-ACETAMINOPHEN 5-325 MG PO TABS: ORAL_TABLET | ORAL | 0 refills | Status: DC

## 2020-04-23 ENCOUNTER — Telehealth: Payer: Self-pay | Admitting: Internal Medicine

## 2020-04-23 NOTE — Telephone Encounter (Signed)
Called and spoke with [name], pts daughter and she stated that ND wanted to know if the drain got below 50 ml.  [name] stated that she drained it Saturday and it was a little below 25.  For the last 3 weeks the drain has been between 25-30 ML and she stated that it is very slow.  [name] noticed Saturday that the area around the drain in a little red and the pt stated that it is sore. Pt does have an appt with ND on Friday but [name] wanted to know if they should move the appt up?  ND please advise. Thanks   Allergies  Allergen Reactions  . Lisinopril Cough  . Statins Other (See Comments)    Myalgias    Current Outpatient Medications on File Prior to Visit  Medication Sig Dispense Refill  . acetaminophen (TYLENOL) 500 MG tablet Take 500 mg by mouth every 6 (six) hours as needed for moderate pain or headache.    . albuterol (VENTOLIN HFA) 108 (90 Base) MCG/ACT inhaler INHALE 2 PUFFS EVERY 4 HOURS AS NEEDED. (Patient taking differently: Inhale 2 puffs into the lungs every 4 (four) hours as needed for wheezing or shortness of breath. ) 8.5 g 5  . amoxicillin (AMOXIL) 500 MG capsule Take 1 capsule (500 mg total) by mouth 3 (three) times daily. 21 capsule 0  . aspirin 81 MG tablet Take 1 tablet (81 mg total) by mouth daily with breakfast. (Patient taking differently: Take 81 mg by mouth daily. ) 30 tablet 1  . budesonide-formoterol (SYMBICORT) 160-4.5 MCG/ACT inhaler USE 2 PUFFS TWICE DAILY FOR ASTHMA. RINSE MOUTH AFTER USE. (Patient taking differently: Inhale 2 puffs into the lungs daily. ) 10.2 g 5  . diclofenac Sodium (VOLTAREN) 1 % GEL Apply 2 g topically 4 (four) times daily. 50 g 3  . diphenhydrAMINE (BENADRYL) 25 MG tablet Take 50 mg by mouth every 6 (six) hours as needed.    . fluconazole (DIFLUCAN) 200 MG tablet Take 1 tablet (200 mg total) by mouth daily. 7 tablet 0  . fluticasone (FLONASE) 50 MCG/ACT nasal spray Place 2 sprays into both nostrils daily. 16 g 6  . furosemide (LASIX) 20 MG tablet  Take 1 tablet (20 mg total) by mouth daily. 30 tablet 0  . glipiZIDE (GLUCOTROL) 5 MG tablet TAKE 1 TABLET BY MOUTH EACH MORNING AND 1/2 TABLET AT SUPPER (Patient taking differently: Take 2.5-5 mg by mouth See admin instructions. Take 5 mg  In the morning  2.5 mg in the evening) 45 tablet 5  . glucose blood (ONE TOUCH ULTRA TEST) test strip USE TO TEST TWICE DAILY. DX;E11.9 100 each 11  . HYDROcodone-acetaminophen (NORCO/VICODIN) 5-325 MG tablet TAKE 1 TABLET BY MOUTH ONCE EVERY 4 HOURS AS NEEDED. 30 tablet 0  . ketoconazole (NIZORAL) 2 % cream Apply under breast and around pleurx site. Let dry before re-applying dressing. 15 g 1  . latanoprost (XALATAN) 0.005 % ophthalmic solution Place 1 drop into both eyes at bedtime.     Marland Kitchen levothyroxine (SYNTHROID) 112 MCG tablet TAKE ONE TABLET BY MOUTH EVERY DAY BEFORE BREAKFAST 15 tablet 0  . LORazepam (ATIVAN) 0.5 MG tablet TAKE 1 TABLET BY MOUTH ONCE EVERY 3 HOURS AS NEEDED. 40 tablet 0  . losartan (COZAAR) 25 MG tablet Take 1 tablet (25 mg total) by mouth daily. 30 tablet 0  . Multiple Vitamins-Minerals (PRESERVISION AREDS 2+MULTI VIT PO) Take 1 capsule by mouth 2 (two) times daily.     Marland Kitchen nystatin (  MYCOSTATIN) 100000 UNIT/ML suspension Take 5 mLs (500,000 Units total) by mouth 4 (four) times daily. Swish, gargle and swallow - for 7 days 140 mL 0  . OXYGEN Inhale 2.5 L into the lungs daily as needed (Shortness of breath).     . pantoprazole (PROTONIX) 40 MG tablet Take 1 tablet (40 mg total) by mouth daily. 90 tablet 1   No current facility-administered medications on file prior to visit.

## 2020-04-23 NOTE — Telephone Encounter (Signed)
I returned Donna's call. I recommend she keep the Friday appointment. We will probably remove the pleurx on Friday if her drainage is that slow. We will check with ultrasound when she comes on Friday. She will drain next on Thursday. Nothing further needed.

## 2020-04-27 ENCOUNTER — Encounter: Payer: Self-pay | Admitting: Internal Medicine

## 2020-04-27 ENCOUNTER — Other Ambulatory Visit: Payer: Self-pay

## 2020-04-27 ENCOUNTER — Ambulatory Visit: Payer: PPO | Admitting: Internal Medicine

## 2020-04-27 VITALS — BP 120/66 | HR 81 | Temp 97.4°F | Ht 63.0 in | Wt 161.4 lb

## 2020-04-27 DIAGNOSIS — J4520 Mild intermittent asthma, uncomplicated: Secondary | ICD-10-CM

## 2020-04-27 DIAGNOSIS — J910 Malignant pleural effusion: Secondary | ICD-10-CM

## 2020-04-27 DIAGNOSIS — Z23 Encounter for immunization: Secondary | ICD-10-CM

## 2020-04-27 DIAGNOSIS — C3491 Malignant neoplasm of unspecified part of right bronchus or lung: Secondary | ICD-10-CM

## 2020-04-27 NOTE — Progress Notes (Signed)
Victoria Vasquez    [id number]    [birth year]  Primary Care Physician:Luking, Elayne Snare, MD Date of Appointment: 04/27/2020 Established Patient Visit  Chief complaint:   Chief Complaint  Patient presents with  . Follow-up    Last night out put from pleurex was not measurable.    HPI: Victoria Vasquez is a 86 y.o. woman with stage IV adenocarcinama of the lung who had pleurx catheter placed.    Interval Updates: Over the last three weeks output has been 25cc with drainage once/week. Yesterday nothing came out.  Output gradually started slowing down.   I have reviewed the patient's family social and past medical history and updated as appropriate.   Past Medical History:  Diagnosis Date  . Arthritis   . Asthma   . Cancer ([place])    BREAST CANCER LEFT BREAST  . Chest pain    Evaluation prior to 2003 reportedly including cath-records never located; Echo in 2009: Mild LVH, LAE2, nl EF, moderate LAE, MR1-2  . Chronic kidney disease   . COPD (chronic obstructive pulmonary disease) (HCC)    Exertional dyspnea; PFTs in 2002: Moderate small airway obstruction, mildly reduced lung volumes and DLCO, nl ABG  . Diabetes mellitus, type II ([place])   . Dyspnea   . Family history of adverse reaction to anesthesia    daugher has postop N&V  . Fatigue   . GERD (gastroesophageal reflux disease)   . Hyperlipidemia   . Hypertension   . Legally blind 1 1 2015  . Macular degeneration of both eyes 1 1 2015  . Parotid mass 2003   Right-2003  . Scoliosis   . Shingles     Past Surgical History:  Procedure Laterality Date  . APPENDECTOMY  1951  . BREAST BIOPSY Left 10/05/2017   Procedure: BREAST BIOPSY;  Surgeon: Aviva Signs, MD;  Location: [facility];  Service: General;  Laterality: Left;  . BREAST LUMPECTOMY Left 03/18/2018  . BREAST LUMPECTOMY Left 03/18/2018   Procedure: LEFT BREAST CENTRAL  LUMPECTOMY;  Surgeon: Rolm Bookbinder, MD;  Location: [place];  Service: General;  Laterality:  Left;  . CARDIAC CATHETERIZATION  1999  . CHEST TUBE INSERTION Bilateral 07/28/2019   Procedure: INSERTION PLEURAL DRAINAGE CATHETER;  Surgeon: Spero Geralds, MD;  Location[facility] ENDOSCOPY;  Service: Pulmonary;  Laterality: Bilateral;  . CHOLECYSTECTOMY  2003  . COLONOSCOPY  04/16/2006   [place]-Normal colon.  Exam limited due to retained particulate matter in the lumen of the colon, polyps less than 1 cm would have been easily missed.  Otherwise, no polyps, masses, inflammatory changes or vascular ectasia seen/ Normal retroflexed view of the rectum..  recommend repeat exam at 03/2011  . ORIF  1996  . ORIF ANKLE FRACTURE  1992  . THYROIDECTOMY N/A 11/03/2018   Procedure: TOTAL THYROIDECTOMY;  Surgeon: Michael Boston, MD;  Location: [facility];  Service: General;  Laterality: N/A;    Family History  Problem Relation Age of Onset  . Stomach cancer Mother   . Alzheimer's disease Sister   . Colon cancer Neg Hx     Social History   Occupational History  . Occupation: Retired  Tobacco Use  . Smoking status: Passive Smoke Exposure - Never Smoker  . Smokeless tobacco: Never Used  Vaping Use  . Vaping Use: Never used  Substance and Sexual Activity  . Alcohol use: No  . Drug use: No  . Sexual activity: Not on file  Physical Exam: Blood pressure 120/66, pulse 81, SpO2 96 %.  Gen:      No acute distress Lungs:    Kyphosis. On room air.No increased respiratory effort, symmetric chest wall excursion, clear to auscultation bilaterally, no wheezes or crackles. Right sided pleurx catheter in place.  CV:         Regular rate and rhythm; no murmurs, rubs, or gallops.  No pedal edema  Data Reviewed: Imaging: I have personally reviewed the chest xray from march 11 which shows adequate positioning of the catheter with improvement of effusion.   Immunization status: Immunization History  Administered Date(s) Administered  . Influenza Split 03/07/2013  . Influenza,inj,Quad PF,6+ Mos 03/27/2014,  03/27/2015, 01/24/2016, 02/12/2017, 02/16/2018, 02/25/2019  . Moderna SARS-COVID-2 Vaccination 05/31/2019, 06/30/2019  . Pneumococcal Conjugate-13 05/18/2014  . Pneumococcal Polysaccharide-23 01/24/2016  . Zoster Recombinat (Shingrix) 06/23/2018, 01/28/2019    Assessment:  Malignant pleural effusion, right side with indwelling pleural catheter Adenocarcinoma, stage IV of the lung primary Mild intermittent asthma well controlled  Plan/Recommendations: Pleurx catheter pulled today in clinic. There was a small clot in the tube. She is not having any worsening dyspnea over the last couple of weeks.  I will have her follow up in 2 months with telephone visit to ensure breathing is stable.   Continue as needed bronchodilators for asthma.   She is under hospice care for her adenocarcinoma.  Lenice Llamas, MD Pulmonary and [place]

## 2020-05-15 ENCOUNTER — Other Ambulatory Visit: Payer: Self-pay | Admitting: Family Medicine

## 2020-05-15 ENCOUNTER — Other Ambulatory Visit: Payer: Self-pay | Admitting: Hematology and Oncology

## 2020-05-15 NOTE — Telephone Encounter (Signed)
Mendel Ryder, Are you able to refill this for Dr. Lindi Adie?

## 2020-05-21 ENCOUNTER — Other Ambulatory Visit: Payer: Self-pay | Admitting: Hematology and Oncology

## 2020-05-28 ENCOUNTER — Other Ambulatory Visit: Payer: Self-pay | Admitting: Adult Health

## 2020-05-28 NOTE — Telephone Encounter (Signed)
Mendel Ryder, For your approval or refusal. Gardiner Rhyme, RN

## 2020-06-11 ENCOUNTER — Other Ambulatory Visit: Payer: Self-pay | Admitting: Internal Medicine

## 2020-07-04 ENCOUNTER — Ambulatory Visit: Payer: PPO | Admitting: Internal Medicine

## 2020-07-09 ENCOUNTER — Other Ambulatory Visit: Payer: Self-pay | Admitting: Family Medicine

## 2020-07-12 ENCOUNTER — Other Ambulatory Visit: Payer: Self-pay | Admitting: Family Medicine

## 2020-07-31 ENCOUNTER — Other Ambulatory Visit: Payer: Self-pay | Admitting: "Endocrinology

## 2021-01-04 DIAGNOSIS — M549 Dorsalgia, unspecified: Secondary | ICD-10-CM | POA: Diagnosis not present

## 2021-01-04 DIAGNOSIS — R739 Hyperglycemia, unspecified: Secondary | ICD-10-CM | POA: Diagnosis not present

## 2021-01-04 DIAGNOSIS — R410 Disorientation, unspecified: Secondary | ICD-10-CM | POA: Diagnosis not present

## 2021-01-04 DIAGNOSIS — R404 Transient alteration of awareness: Secondary | ICD-10-CM | POA: Diagnosis not present

## 2021-01-17 DEATH — deceased
